# Patient Record
Sex: Female | Born: 1965 | Race: Black or African American | Hispanic: No | Smoking: Former smoker
Health system: Southern US, Community
[De-identification: ages and names within clinical notes are randomized; demographics above are authoritative.]

## PROBLEM LIST (undated history)

## (undated) DIAGNOSIS — F32A Depression, unspecified: Secondary | ICD-10-CM

## (undated) DIAGNOSIS — I10 Essential (primary) hypertension: Secondary | ICD-10-CM

## (undated) DIAGNOSIS — F419 Anxiety disorder, unspecified: Secondary | ICD-10-CM

## (undated) DIAGNOSIS — E119 Type 2 diabetes mellitus without complications: Secondary | ICD-10-CM

## (undated) DIAGNOSIS — F988 Other specified behavioral and emotional disorders with onset usually occurring in childhood and adolescence: Secondary | ICD-10-CM

## (undated) DIAGNOSIS — F329 Major depressive disorder, single episode, unspecified: Secondary | ICD-10-CM

## (undated) HISTORY — DX: Major depressive disorder, single episode, unspecified: F32.9

## (undated) HISTORY — DX: Essential (primary) hypertension: I10

## (undated) HISTORY — PX: LEG SURGERY: SHX1003

## (undated) HISTORY — DX: Depression, unspecified: F32.A

## (undated) HISTORY — DX: Anxiety disorder, unspecified: F41.9

## (undated) HISTORY — DX: Other specified behavioral and emotional disorders with onset usually occurring in childhood and adolescence: F98.8

## (undated) HISTORY — PX: ABDOMINAL HYSTERECTOMY: SHX81

---

## 2009-08-19 DIAGNOSIS — E119 Type 2 diabetes mellitus without complications: Secondary | ICD-10-CM

## 2009-08-19 HISTORY — DX: Type 2 diabetes mellitus without complications: E11.9

## 2014-05-18 ENCOUNTER — Encounter (HOSPITAL_COMMUNITY): Payer: Self-pay | Admitting: Emergency Medicine

## 2014-05-18 ENCOUNTER — Emergency Department (HOSPITAL_COMMUNITY): Payer: BC Managed Care – PPO

## 2014-05-18 ENCOUNTER — Emergency Department (HOSPITAL_COMMUNITY)
Admission: EM | Admit: 2014-05-18 | Discharge: 2014-05-19 | Disposition: A | Discharge status: Home / Self Care | Payer: BC Managed Care – PPO | Attending: Emergency Medicine | Admitting: Emergency Medicine

## 2014-05-18 DIAGNOSIS — Z79899 Other long term (current) drug therapy: Secondary | ICD-10-CM | POA: Insufficient documentation

## 2014-05-18 DIAGNOSIS — W458XXA Other foreign body or object entering through skin, initial encounter: Secondary | ICD-10-CM | POA: Insufficient documentation

## 2014-05-18 DIAGNOSIS — T148XXA Other injury of unspecified body region, initial encounter: Secondary | ICD-10-CM

## 2014-05-18 DIAGNOSIS — Z792 Long term (current) use of antibiotics: Secondary | ICD-10-CM | POA: Insufficient documentation

## 2014-05-18 DIAGNOSIS — Y93E5 Activity, floor mopping and cleaning: Secondary | ICD-10-CM | POA: Insufficient documentation

## 2014-05-18 DIAGNOSIS — S60451A Superficial foreign body of left index finger, initial encounter: Secondary | ICD-10-CM | POA: Insufficient documentation

## 2014-05-18 DIAGNOSIS — Y92009 Unspecified place in unspecified non-institutional (private) residence as the place of occurrence of the external cause: Secondary | ICD-10-CM | POA: Diagnosis not present

## 2014-05-18 DIAGNOSIS — S60459A Superficial foreign body of unspecified finger, initial encounter: Secondary | ICD-10-CM | POA: Diagnosis present

## 2014-05-18 DIAGNOSIS — E1140 Type 2 diabetes mellitus with diabetic neuropathy, unspecified: Secondary | ICD-10-CM | POA: Insufficient documentation

## 2014-05-18 DIAGNOSIS — E1149 Type 2 diabetes mellitus with other diabetic neurological complication: Secondary | ICD-10-CM | POA: Diagnosis not present

## 2014-05-18 DIAGNOSIS — W268XXA Contact with other sharp object(s), not elsewhere classified, initial encounter: Secondary | ICD-10-CM | POA: Diagnosis not present

## 2014-05-18 DIAGNOSIS — S61209A Unspecified open wound of unspecified finger without damage to nail, initial encounter: Secondary | ICD-10-CM | POA: Diagnosis not present

## 2014-05-18 DIAGNOSIS — Y92039 Unspecified place in apartment as the place of occurrence of the external cause: Secondary | ICD-10-CM | POA: Insufficient documentation

## 2014-05-18 DIAGNOSIS — S6992XA Unspecified injury of left wrist, hand and finger(s), initial encounter: Secondary | ICD-10-CM

## 2014-05-18 DIAGNOSIS — Z23 Encounter for immunization: Secondary | ICD-10-CM | POA: Diagnosis not present

## 2014-05-18 DIAGNOSIS — E1142 Type 2 diabetes mellitus with diabetic polyneuropathy: Secondary | ICD-10-CM | POA: Diagnosis not present

## 2014-05-18 DIAGNOSIS — S61201A Unspecified open wound of left index finger without damage to nail, initial encounter: Secondary | ICD-10-CM | POA: Insufficient documentation

## 2014-05-18 HISTORY — DX: Type 2 diabetes mellitus without complications: E11.9

## 2014-05-18 MED ORDER — LIDOCAINE HCL (PF) 1 % IJ SOLN
5.0000 mL | Freq: Once | INTRAMUSCULAR | Status: AC
  Administered 2014-05-18: 5 mL
  Filled 2014-05-18: qty 5

## 2014-05-18 NOTE — ED Notes (Signed)
Pt. presents with tip of  fish hook embedded at left distal index finger sustained this evening while cleaning her carpet at home , no bleeding .

## 2014-05-19 ENCOUNTER — Emergency Department (HOSPITAL_COMMUNITY): Payer: BC Managed Care – PPO

## 2014-05-19 DIAGNOSIS — Z23 Encounter for immunization: Secondary | ICD-10-CM | POA: Diagnosis not present

## 2014-05-19 DIAGNOSIS — E1140 Type 2 diabetes mellitus with diabetic neuropathy, unspecified: Secondary | ICD-10-CM | POA: Diagnosis not present

## 2014-05-19 DIAGNOSIS — W458XXA Other foreign body or object entering through skin, initial encounter: Secondary | ICD-10-CM | POA: Diagnosis not present

## 2014-05-19 DIAGNOSIS — S61201A Unspecified open wound of left index finger without damage to nail, initial encounter: Secondary | ICD-10-CM | POA: Diagnosis not present

## 2014-05-19 DIAGNOSIS — Y93E5 Activity, floor mopping and cleaning: Secondary | ICD-10-CM | POA: Diagnosis not present

## 2014-05-19 DIAGNOSIS — Z792 Long term (current) use of antibiotics: Secondary | ICD-10-CM | POA: Diagnosis not present

## 2014-05-19 DIAGNOSIS — S60451A Superficial foreign body of left index finger, initial encounter: Secondary | ICD-10-CM | POA: Diagnosis not present

## 2014-05-19 DIAGNOSIS — Y92039 Unspecified place in apartment as the place of occurrence of the external cause: Secondary | ICD-10-CM | POA: Diagnosis not present

## 2014-05-19 DIAGNOSIS — Z79899 Other long term (current) drug therapy: Secondary | ICD-10-CM | POA: Diagnosis not present

## 2014-05-19 MED ORDER — TETANUS-DIPHTH-ACELL PERTUSSIS 5-2.5-18.5 LF-MCG/0.5 IM SUSP
0.5000 mL | Freq: Once | INTRAMUSCULAR | Status: AC
  Administered 2014-05-19: 0.5 mL via INTRAMUSCULAR
  Filled 2014-05-19: qty 0.5

## 2014-05-19 MED ORDER — LIDOCAINE HCL (PF) 1 % IJ SOLN
5.0000 mL | Freq: Once | INTRAMUSCULAR | Status: AC
  Administered 2014-05-19: 5 mL
  Filled 2014-05-19: qty 5

## 2014-05-19 MED ORDER — CLINDAMYCIN HCL 150 MG PO CAPS: 300.0000 mg | ORAL_CAPSULE | Freq: Three times a day (TID) | ORAL | Status: DC

## 2014-05-19 NOTE — ED Provider Notes (Signed)
Medical screening examination/treatment/procedure(s) were performed by non-physician practitioner and as supervising physician I was immediately available for consultation/collaboration.   EKG Interpretation None        Tomasita CrumbleAdeleke Kayda Allers, MD 05/19/14 0600

## 2014-05-19 NOTE — ED Provider Notes (Signed)
CSN: [id number]     Arrival date & time 05/18/14  2230 History   First MD Initiated Contact with Frances Wilson 05/18/14 2353     Chief Complaint  Frances Wilson presents with  . Finger Injury     (Consider location/radiation/quality/duration/timing/severity/associated sxs/prior Treatment) The history is provided by the Frances Wilson. No language interpreter was used.  Frances Wilson is a 48 y/o F with PMHx of DM presenting to the ED with fish hook stuck in Frances Wilson left index finger. Frances Wilson reported that Frances Wilson has history of DM and neuropathy - stated that Frances Wilson was on the ground and felt something sharp on Frances Wilson hand and stated that the sharp object poked Frances Wilson. Stated that Frances Wilson noticed the fish hook when it was stuck in Frances Wilson finger. Frances Wilson reported that Frances Wilson recently moved into a new apartment and stated that Frances Wilson does not own a fish hook, nor does Frances Wilson know who it is from. Stated that Frances Wilson has been having pain in Frances Wilson left index finger. Stated that Frances Wilson unsure of when Frances Wilson last tetanus shot was. Denied numbness, tingling, loss of sensation. PCP none   Past Medical History  Diagnosis Date  . Diabetes mellitus without complication    Past Surgical History  Procedure Laterality Date  . Abdominal hysterectomy    . Leg surgery     No family history on file. History  Substance Use Topics  . Smoking status: Never Smoker   . Smokeless tobacco: Not on file  . Alcohol Use: Yes   OB History   Grav Para Term Preterm Abortions TAB SAB Ect Mult Living                 Review of Systems  Skin: Positive for wound.  Neurological: Negative for weakness and numbness.      Allergies  Maxipime  Home Medications   Prior to Admission medications   Medication Sig Start Date End Date Taking? Authorizing Provider  glipiZIDE (GLUCOTROL XL) 5 MG 24 hr tablet Take 5 mg by mouth daily with breakfast.   Yes Historical Provider, MD  metFORMIN (GLUCOPHAGE) 1000 MG tablet Take 1,000 mg by mouth 2 (two) times daily with a meal.   Yes  Historical Provider, MD  clindamycin (CLEOCIN) 150 MG capsule Take 2 capsules (300 mg total) by mouth 3 (three) times daily. 05/19/14   Dellie Piasecki, PA-C   BP 144/91  Pulse 78  Temp(Src) 97.5 F (36.4 C) (Oral)  Resp 16  Ht 5\' 6"  (1.676 m)  Wt 180 lb (81.647 kg)  BMI 29.07 kg/m2  SpO2 100% Physical Exam  Nursing note and vitals reviewed. Constitutional: Frances Wilson is oriented to person, place, and time. Frances Wilson appears well-developed and well-nourished. No distress.  HENT:  Head: Normocephalic and atraumatic.  Eyes: Conjunctivae and EOM are normal. Right eye exhibits no discharge. Left eye exhibits no discharge.  Neck: Normal range of motion. Neck supple. No tracheal deviation present.  Cardiovascular: Normal rate, regular rhythm and normal heart sounds.   Pulses:      Radial pulses are 2+ on the right side, and 2+ on the left side.  Pulmonary/Chest: Effort normal and breath sounds normal. No respiratory distress. Frances Wilson has no wheezes. Frances Wilson has no rales.  Musculoskeletal: Normal range of motion. Frances Wilson exhibits tenderness.  Tenderness upon palpation to the radial aspect of the left index finger where fish hook is placed. Full ROM to the left index finger without difficulty or ataxia.   Lymphadenopathy:    Frances Wilson has no cervical adenopathy.  Neurological: Frances Wilson is alert and oriented to person, place, and time. No cranial nerve deficit. Frances Wilson exhibits normal muscle tone. Coordination normal.  Cranial nerves III-XII grossly intact Strength 5+/5+ to upper extremities bilaterally with resistance applied, equal distribution noted Strength intact to MCP, PIP, DIP joints of left hand Sensation intact with differentiation to sharp and dull touch   Skin: Skin is warm and dry. No rash noted. Frances Wilson is not diaphoretic. No erythema.  Fish hook through tissue of the distal left phalanx, radial aspect. Negative bleeding noted.   Psychiatric: Frances Wilson has a normal mood and affect. Frances Wilson behavior is normal. Thought content  normal.    ED Course  Procedures (including critical care time) Labs Review Labs Reviewed - No data to display  Imaging Review Dg Finger Index Left  05/19/2014   CLINICAL DATA:  Left index finger laceration after fishhook stuck in the distal finger.  EXAM: LEFT INDEX FINGER 2+V  COMPARISON:  05/19/2015  FINDINGS: Interval removal of metallic foreign body from the distal aspect left second finger. Soft tissues not appear unremarkable. No evidence of acute fracture or dislocation. No residual foreign bodies demonstrated.  IMPRESSION: Negative.   Electronically Signed   By: Burman Nieves M.D.   On: 05/19/2014 01:22   Dg Finger Index Left  05/18/2014   CLINICAL DATA:  Finger injury with fishhook stuck in the distal phalanx  EXAM: LEFT INDEX FINGER 2+V  COMPARISON:  None.  FINDINGS: Foreign body in the soft tissues of the distal aspect left second finger consistent with fishhook. Associated soft tissue laceration. Underlying bones appear intact. There is no evidence of fracture or dislocation. There is no evidence of arthropathy or other focal bone abnormality.  IMPRESSION: Fish hook in the soft tissues of the distal left second finger. No acute bony abnormalities.   Electronically Signed   By: Burman Nieves M.D.   On: 05/18/2014 23:31     EKG Interpretation None      MDM   Final diagnoses:  Fish hook injury of finger, left, initial encounter  Puncture wound    Medications  lidocaine (PF) (XYLOCAINE) 1 % injection 5 mL (5 mLs Infiltration Given 05/18/14 2349)  lidocaine (PF) (XYLOCAINE) 1 % injection 5 mL (5 mLs Infiltration Given 05/19/14 0045)  Tdap (BOOSTRIX) injection 0.5 mL (0.5 mLs Intramuscular Given 05/19/14 0043)   Filed Vitals:   05/18/14 2238 05/19/14 0209  BP: 148/95 144/91  Pulse: 104 78  Temp: 97.7 F (36.5 C) 97.5 F (36.4 C)  TempSrc: Oral Oral  Resp: 18 16  Height: 5\' 6"  (1.676 m)   Weight: 180 lb (81.647 kg)   SpO2: 95% 100%    Frances Wilson presenting to the  ED with fish hook stuck in the distal phalanx of the left index finger, radial aspect. Full ROM noted with negative difficulty or ataxia. Pulses palpable and strong. Sensation intact with differentiation. 2 point discrimination intact. Removal of fish hook performed by Sarita Bottom, PA-C - Frances Wilson tolerated procedure well, please see Alfonzo Beers note for further information. Post-removal plain film performed with negative remainings. Site thoroughly cleaned and finger soaked. Frances Wilson tetanus updated. Frances Wilson stable, afebrile. Frances Wilson not septic appearing. Discharged Frances Wilson. Discharged with antibiotics since Frances Wilson has history of DM. [facility]erred [facility]. Discussed with Frances Wilson to keep site clean. Discussed with Frances Wilson to rest and stay hydrated. Discussed with Frances Wilson to closely monitor symptoms and if symptoms are to worsen or change to report back to the ED -  strict return instructions given.  Frances Wilson agreed to plan of care, understood, all questions answered.   Raymon MuttonMarissa Vaness Jelinski, PA-C 05/19/14 [phone]

## 2014-05-19 NOTE — Discharge Instructions (Signed)
Please call your doctor for a followup appointment within 24-48 hours. When you talk to your doctor please let them know that you were seen in the emergency department and have them acquire all of your records so that they can discuss the findings with you and formulate a treatment plan to fully care for your new and ongoing problems. Please call and set-up an appointment with hand specialist Please rest and stay hydrated Please keep site clean at all times - please wash with warm warm and soap Please keep dry with fresh clean gauze - if guaze get wet please change them Please take antibiotics as prescribed - please take on a full stomach  Please continue to monitor symptoms closely and if symptoms are to worsen or change (fever greater than 101, chills, sweating, nausea, vomiting, chest pain, shortness of breathe, difficulty breathing, weakness, numbness, tingling, worsening or changes to pain pattern, swelling to the finger, redness, red streaks, hot to the touch, drainage) please report back to the Emergency Department immediately.   Fish Hook Removal A fish hook can cause a small cut or lesion that extends through all layers of the skin and into the subcutaneous tissue. Because of this, bacteria may get injected beneath the surface of the skin. A simple bandage (dressing) may be applied. This should be changed daily. Follow your caregiver's instructions regarding use of any antibacterial ointments.  Only take over-the-counter or prescription medicines for pain, discomfort, or fever as directed by your caregiver. If you did not receive a tetanus shot from your caregiver because you did not recall when your last one was given, make sure to check with your physician's office and determine if one is needed. Generally for a "dirty" wound, you should receive a tetanus booster if you have not had one in the last five years. If you have a "clean" wound, you should receive a tetanus booster if you have not had  one within the last ten years. SEEK IMMEDIATE MEDICAL CARE IF:   You develop redness, swelling, or increasing pain in the wound.  You have a fever.  You notice a bad smell coming from the wound or dressing.  You notice pus or other unusual drainage coming from the wound. Document Released: 08/02/2000 Document Revised: 10/28/2011 Document Reviewed: 02/23/2009 [facility] Patient Information 2015 [place], [place]. This information is not intended to replace advice given to you by your health care provider. Make sure you discuss any questions you have with your health care provider.  Puncture Wound A puncture wound is an injury that extends through all layers of the skin and into the tissue beneath the skin (subcutaneous tissue). Puncture wounds become infected easily because germs often enter the body and go beneath the skin during the injury. Having a deep wound with a small entrance point makes it difficult for your caregiver to adequately clean the wound. This is especially true if you have stepped on a nail and it has passed through a dirty shoe or other situations where the wound is obviously contaminated. CAUSES  Many puncture wounds involve glass, nails, splinters, fish hooks, or other objects that enter the skin (foreign bodies). A puncture wound may also be caused by a human bite or animal bite. DIAGNOSIS  A puncture wound is usually diagnosed by your history and a physical exam. You may need to have an X-ray or an ultrasound to check for any foreign bodies still in the wound. TREATMENT   Your caregiver will clean the wound as thoroughly  as possible. Depending on the location of the wound, a bandage (dressing) may be applied.  Your caregiver might prescribe antibiotic medicines.  You may need a follow-up visit to check on your wound. Follow all instructions as directed by your caregiver. HOME CARE INSTRUCTIONS   Change your dressing once per day, or as directed by your caregiver. If  the dressing sticks, it may be removed by soaking the area in water.  If your caregiver has given you follow-up instructions, it is very important that you return for a follow-up appointment. Not following up as directed could result in a chronic or permanent injury, pain, and disability.  Only take over-the-counter or prescription medicines for pain, discomfort, or fever as directed by your caregiver.  If you are given antibiotics, take them as directed. Finish them even if you start to feel better. You may need a tetanus shot if:  You cannot remember when you had your last tetanus shot.  You have never had a tetanus shot. If you got a tetanus shot, your arm may swell, get red, and feel warm to the touch. This is common and not a problem. If you need a tetanus shot and you choose not to have one, there is a rare chance of getting tetanus. Sickness from tetanus can be serious. You may need a rabies shot if an animal bite caused your puncture wound. SEEK MEDICAL CARE IF:   You have redness, swelling, or increasing pain in the wound.  You have red streaks going away from the wound.  You notice a bad smell coming from the wound or dressing.  You have yellowish-white fluid (pus) coming from the wound.  You are treated with an antibiotic for infection, but the infection is not getting better.  You notice something in the wound, such as rubber from your shoe, cloth, or another object.  You have a fever.  You have severe pain.  You have difficulty breathing.  You feel dizzy or faint.  You cannot stop vomiting.  You lose feeling, develop numbness, or cannot move a limb below the wound.  Your symptoms worsen. MAKE SURE YOU:  Understand these instructions.  Will watch your condition.  Will get help right away if you are not doing well or get worse. Document Released: 05/15/2005 Document Revised: 10/28/2011 Document Reviewed: 01/22/2011 [facility] Patient Information 2015  [place], [place]. This information is not intended to replace advice given to you by your health care provider. Make sure you discuss any questions you have with your health care provider.   Emergency Department Resource Guide 1) Find a Doctor and Pay Out of Pocket Although you won't have to find out who is covered by your insurance plan, it is a good idea to ask around and get recommendations. You will then need to call the office and see if the doctor you have chosen will accept you as a new patient and what types of options they offer for patients who are self-pay. Some doctors offer discounts or will set up payment plans for their patients who do not have insurance, but you will need to ask so you aren't surprised when you get to your appointment.  2) Contact Your Local Health Department Not all health departments have doctors that can see patients for sick visits, but many do, so it is worth a call to see if yours does. If you don't know where your local health department is, you can check in your phone book. The CDC also has a  tool to help you locate your state's health department, and many state websites also have listings of all of their local health departments.  3) Find a Walk-in Clinic If your illness is not likely to be very severe or complicated, you may want to try a walk in clinic. These are popping up all over the country in pharmacies, drugstores, and shopping centers. They're usually staffed by nurse practitioners or physician assistants that have been trained to treat common illnesses and complaints. They're usually fairly quick and inexpensive. However, if you have serious medical issues or chronic medical problems, these are probably not your best option.  No Primary Care Doctor: - Call Health Connect at  [phone] - they can help you locate a primary care doctor that  accepts your insurance, provides certain services, etc. - Physician Referral Service- [phone]  Chronic Pain  Problems: Organization         Address  Phone   Notes  [facility]  [phone] Patients need to be referred by their primary care doctor.   Medication Assistance: Organization         Address  Phone   Notes  [facility] Medication [facility] [address] [place], [place] [address] [phone] --Must be a resident of [facility] -- Must have NO insurance coverage whatsoever (no Medicaid/ Medicare, etc.) -- The pt. MUST have a primary care doctor that directs their care regularly and follows them in the community   [facility]  [phone]   [employer]  [phone]    Agencies that provide inexpensive medical care: Organization         Address  Phone   Notes  [facility]  [phone]   [facility]    [phone]   [facility] [address] [place], [place] [address] [phone]   [facility] [address], [place] [phone]   Planned Parenthood    [phone]   [facility]    [phone]   [facility]  [address], [place] Phone:  [phone], Fax:  [phone] Hours of Operation:  9 am - 6 pm, M-F.  Also accepts Medicaid/Medicare and self-pay.  [facility]  [address], [place] Phone: [phone], Fax: [phone]. Hours of Operation:  8:30 am - 5:30 pm, M-F.  Also accepts Medicaid and self-pay.  [facility] [place] [address] [place] Phone: [phone]   [facility] [address] Natasha Bence [place], [place] [phone], Ext. [phone] Mondays & Thursdays: 7-9 AM.  First 15 patients are seen on a first come, first serve basis.    Medicaid-accepting [facility] Providers:  Organization         Address  Phone   Notes  [facility] [address], [place] [phone] Also  accepts self-pay patients.  [facility] [address] Laurell Josephs [place], [place]  [phone]   [facility] [address], [place] [phone]   [facility] [address] [place] [phone]   Renaye Rakers [address], [place]   [phone] Only accepts [place] Access [place] patients after they have their name applied to their card.   Self-Pay (no insurance) in [place]:  Organization         Address  Phone   Notes  Sickle Cell Patients, [facility] [address] [place], [place] [phone]   [facility] [address] [place], [place] [phone]   [facility] [place]  [address],  [phone]   [facility]/Dr. Osei-Bonsu  [address] [place] or [address], [place] [phone] Phone number for both [place] and [place] locations is the same.  [facility]gent [facility] and [facility] [address], [place] [phone]   [facility] [address] [place] or [address] [phone]   [facility] [address], [place] [phone], phone; [phone], fax Sees patients 1st and 3rd Saturday of every month.  Must not qualify for public or private insurance (i.e. Medicaid, Medicare, [place] Health Choice, Veterans' Benefits)  Household income should be no more than 200% of the poverty level The clinic cannot treat you if you are pregnant or think you are pregnant  Sexually transmitted diseases are not treated at the clinic.    Dental Care: Organization         Address  Phone  Notes  [facility] Department of [facility] [address] [place], [place] [phone] Accepts children up to age 70 who are enrolled in [place] or [place] Health Choice; pregnant  women with a Medicaid card; and children who have applied for Medicaid or [place] Health Choice, but were declined, whose parents can pay a reduced fee at time of service.  [facility] Department of [facility]  [address], [place] [phone] Accepts children up to age 60 who are enrolled in [place] or [place] Health Choice; pregnant women with a Medicaid card; and children who have applied for Medicaid or [place] Health Choice, but were declined, whose parents can pay a reduced fee at time of service.  [facility]  [address] [place], [place] [phone] Patients are seen by appointment only. Walk-ins are not accepted. [facility] will see patients 62 years of age and older. Monday - Tuesday (8am-5pm) Most Wednesdays (8:30-5pm) $30 per visit, cash only  [facility] Adult Dental Access [facility]  [address], [facility] [phone] Patients are seen by appointment only. Walk-ins are not accepted. [facility] will see patients 43 years of age and older. One Wednesday Evening (Monthly: Volunteer Based).  $30 per visit, cash only  [employer]  [phone] for adults; Children under age 68, call Graduate Pediatric Dentistry at [phone]. Children aged 65-14, please call [phone] to request a pediatric application.  Dental services are provided in all areas of dental care including fillings, crowns and bridges, complete and partial dentures, implants, gum treatment, root canals, and extractions. Preventive care is also provided. Treatment is provided to both adults and children. Patients are selected via a lottery and there is often a waiting list.   [facility] [address] [place]  [phone] [email or web address]   [facility] [address] [place], [place] [phone], Ext. [phone] Second and Fourth Thursday of each month, opens at 6:30 AM; Clinic ends at 9 AM.  Patients are  seen on a first-come first-served basis, and a limited number are seen during each clinic.  [facility]  [address] Ether Griffins [place], [place] [phone]   Eligibility Requirements You must have lived in [place], [place], or [place] counties for at least the last three months.   You cannot be eligible for state or federal sponsored [place], including [employer], [place], or [employer].   You generally cannot be eligible for healthcare insurance through your employer.    How to apply: Eligibility screenings are held every Tuesday and Wednesday afternoon from 1:00 pm until 4:00 pm. You do not need an appointment for the interview!  [facility] [address], [place], [place] [phone]   [facility] Health Department  [phone]   [facility] Health Department  [phone]   [facility]  [phone]    Behavioral Health Resources in the Community: Intensive Outpatient Programs Organization         Address  Phone  Notes  [facility] Services [address], [place], [place] [phone]   [facility] Outpatient [address], [place], [place] [phone]   [facility] [address] [place], [place]  [phone]   [facility] Mental Health [address],  [place], [place] [phone] or [phone]   Substance Abuse Resources Organization         Address  Phone  Notes  [facility]  [phone]   [facility]  [phone]   [facility] [place]  [phone]   Floydene Flock  [phone]   Residential & Outpatient Substance Abuse Program  [phone]   Psychological Services Organization         Address  Phone  Notes  [facility]  [phone]   [facility] Services  [phone]   [facility] Mental Health [address] [place] [phone] or [phone]    Mobile Crisis  Teams Organization         Address  Phone  Notes  [facility], Mobile Crisis Care Unit  [phone]   [facility]  [address] [place], [place] [phone]   Doristine Locks [address] [place] [phone]    Self-Help/Support Groups Organization         Address  Phone             Notes  [facility] [place] - variety of support groups  336- I7437963 Call for more information  Narcotics Anonymous (NA), [facility] [address], [employer] [place]  2 meetings at this location   Statistician         Address  Phone  Notes  [facility] [address],    [place]  [phone]   [facility]  [address], [place] [address], [place], [place] [phone]   [facility] [address] [place], [place] [phone] Admissions: 8am-3pm M-F  [facility] [address]    [place], [place] [phone]   [facility] [address] Starling Manns [place], [place] [phone]   [facility] [address],  [place], [place] [phone]   [facility] - Intensive Outpatient [address] Laurell Josephs [address], [place], [place] [phone]   [facility] ([facility] [address] [place]  [place], [place] [phone] or [phone]   [facility] ([facility]) [address] [place], [place] [phone] Accepts Medicaid  [facility] [address],  [place] [phone] Substance Abuse/Addiction Treatment  [facility] Behavioral Health Resources Organization         Address  Phone  Notes  [facility]  [phone]) [phone]   Angie FavaJulie Brannon, PhD [address] [place], [place]   [phone]) [phone] or [phone]) [phone]   [facility]   [address] [place], [place] [phone]) [phone]   [facility] [address], [place], [place] [phone](336) [phone]  Insurance/Medicaid/sponsorship through [facility]  [facility] [address]                                    [place], [place] [phone](336) [phone] Therapy/tele-psych/case  [facility] [address].   [place], [place] [phone]) [phone]    Dr. Lolly MustacheArfeen  [phone]) [phone]   [facility]  [employer] [facility] 1) [address], [place] 2) [address], [place] 3)  [address], [place] [phone](336) [phone](336) [phone]  [phone](336) [phone]   [facility] Child Abuse Hotline [phone](336) [phone] or [phone](336) [phone] (After Hours)

## 2014-05-19 NOTE — ED Provider Notes (Signed)
Frances Wilson is a 48 y.o. female diabetic with fishhook into the left second digit recurrent just prior to arrival.   FOREIGN BODY REMOVAL Date/Time: 05/19/2014 12:47 AM Performed by: Wynetta EmeryPISCIOTTA, Nixie Laube Authorized by: Wynetta EmeryPISCIOTTA, Tafari Humiston Consent: Verbal consent obtained. Risks and benefits: risks, benefits and alternatives were discussed Consent given by: patient Patient understanding: patient states understanding of the procedure being performed Patient identity confirmed: verbally with patient Body area: skin Anesthesia: nerve block Local anesthetic: lidocaine 1% without epinephrine Anesthetic total: 9 ml Patient sedated: no Patient restrained: no Localization method: serial x-rays and visualized Removal mechanism: forceps and hemostat Dressing: antibiotic ointment Tendon involvement: none Depth: deep Complexity: simple 1 objects recovered. Objects recovered: Metal barbed fishhook Post-procedure assessment: foreign body removed Patient tolerance: Patient tolerated the procedure well with no immediate complications.  Dg Finger Index Left  05/19/2014   CLINICAL DATA:  Left index finger laceration after fishhook stuck in the distal finger.  EXAM: LEFT INDEX FINGER 2+V  COMPARISON:  05/19/2015  FINDINGS: Interval removal of metallic foreign body from the distal aspect left second finger. Soft tissues not appear unremarkable. No evidence of acute fracture or dislocation. No residual foreign bodies demonstrated.  IMPRESSION: Negative.   Electronically Signed   By: Burman NievesWilliam  Stevens M.D.   On: 05/19/2014 01:22   Dg Finger Index Left  05/18/2014   CLINICAL DATA:  Finger injury with fishhook stuck in the distal phalanx  EXAM: LEFT INDEX FINGER 2+V  COMPARISON:  None.  FINDINGS: Foreign body in the soft tissues of the distal aspect left second finger consistent with fishhook. Associated soft tissue laceration. Underlying bones appear intact. There is no evidence of fracture or dislocation.  There is no evidence of arthropathy or other focal bone abnormality.  IMPRESSION: Fish hook in the soft tissues of the distal left second finger. No acute bony abnormalities.   Electronically Signed   By: Burman NievesWilliam  Stevens M.D.   On: 05/18/2014 23:31      Wynetta Emeryicole Antoinne Spadaccini, PA-C 05/19/14 0215

## 2014-05-19 NOTE — ED Provider Notes (Signed)
Medical screening examination/treatment/procedure(s) were performed by non-physician practitioner and as supervising physician I was immediately available for consultation/collaboration.   EKG Interpretation None        Tomasita CrumbleAdeleke Brighton Delio, MD 05/19/14 [phone]

## 2014-09-23 ENCOUNTER — Ambulatory Visit (INDEPENDENT_AMBULATORY_CARE_PROVIDER_SITE_OTHER): Payer: BLUE CROSS/BLUE SHIELD | Admitting: Family Medicine

## 2014-09-23 VITALS — BP 168/98 | HR 83 | Temp 98.2°F | Resp 16 | Ht 66.0 in | Wt 180.0 lb

## 2014-09-23 DIAGNOSIS — F900 Attention-deficit hyperactivity disorder, predominantly inattentive type: Secondary | ICD-10-CM | POA: Insufficient documentation

## 2014-09-23 DIAGNOSIS — I10 Essential (primary) hypertension: Secondary | ICD-10-CM

## 2014-09-23 DIAGNOSIS — E119 Type 2 diabetes mellitus without complications: Secondary | ICD-10-CM

## 2014-09-23 DIAGNOSIS — F909 Attention-deficit hyperactivity disorder, unspecified type: Secondary | ICD-10-CM | POA: Insufficient documentation

## 2014-09-23 MED ORDER — LISINOPRIL-HYDROCHLOROTHIAZIDE 20-12.5 MG PO TABS: 1.0000 | ORAL_TABLET | Freq: Every day | ORAL | Status: DC

## 2014-09-23 NOTE — Progress Notes (Signed)
° °  Subjective:    Patient ID: Frances Wilson, female    DOB: Aug 28, 1965, 49 y.o.   MRN: [id number] This chart was scribed for Elvina SidleKurt Lauenstein, MD by Littie Deedsichard Sun, Medical Scribe. This patient was seen in Room 9 and the patient's care was started at 3:54 PM.   HPI HPI Comments: Frances Wilson is a 49 y.o. female with a hx of DM who presents to the [facility] complaining of elevated blood pressure. She reports having associated gradual onset headache that started a few weeks ago but worsened today. She has taken ibuprofen but without relief. She notes that she has been waking up with a HA for the past few weeks, but today her HA was constant. Patient states that this is the first time that she has had high blood pressure.  Patient recently started taking metformin again last month after a long period of not taking it. She is supposed to take her blood sugar at home, but she has not been doing so. She has been doing well on the medication. Her [facility].  Her Adderall is filled by the [facility].  Patient states that she has had enlarged thyroid since her 3220s.  Patient works at [employer]. [place].  Review of Systems  Neurological: Positive for headaches.       Objective:   Physical Exam CONSTITUTIONAL: Well developed/well nourished HEAD: Normocephalic/atraumatic EYES: EOM/PERRL ENMT: Mucous membranes moist NECK: supple with diffusely enlarged goiter SPINE: entire spine nontender CV: S1/S2 noted, no murmurs/rubs/gallops noted LUNGS: Lungs are clear to auscultation bilaterally, no apparent distress ABDOMEN: soft, nontender, no rebound or guarding GU: no cva tenderness NEURO: Pt is awake/alert, moves all extremitiesx4 EXTREMITIES: pulses normal, full ROM SKIN: warm, color normal PSYCH: no abnormalities of mood noted     Assessment & Plan:   This chart was scribed in my  presence and reviewed by me personally.    ICD-9-CM ICD-10-CM   1. Essential hypertension 401.9 I10 lisinopril-hydrochlorothiazide (ZESTORETIC) 20-12.5 MG per tablet   Encouraged to make appt for CPE  Signed, Elvina SidleKurt Lauenstein, MD

## 2014-09-23 NOTE — Patient Instructions (Signed)

## 2014-11-28 ENCOUNTER — Encounter: Payer: Self-pay | Admitting: Family Medicine

## 2014-11-28 ENCOUNTER — Ambulatory Visit (INDEPENDENT_AMBULATORY_CARE_PROVIDER_SITE_OTHER): Payer: BLUE CROSS/BLUE SHIELD | Admitting: Family Medicine

## 2014-11-28 VITALS — BP 110/70 | HR 73 | Temp 98.3°F | Resp 16 | Ht 66.0 in | Wt 175.4 lb

## 2014-11-28 DIAGNOSIS — I10 Essential (primary) hypertension: Secondary | ICD-10-CM

## 2014-11-28 DIAGNOSIS — Z13220 Encounter for screening for lipoid disorders: Secondary | ICD-10-CM

## 2014-11-28 DIAGNOSIS — F902 Attention-deficit hyperactivity disorder, combined type: Secondary | ICD-10-CM | POA: Diagnosis not present

## 2014-11-28 DIAGNOSIS — E049 Nontoxic goiter, unspecified: Secondary | ICD-10-CM | POA: Diagnosis not present

## 2014-11-28 DIAGNOSIS — F418 Other specified anxiety disorders: Secondary | ICD-10-CM | POA: Diagnosis not present

## 2014-11-28 DIAGNOSIS — F32A Depression, unspecified: Secondary | ICD-10-CM

## 2014-11-28 DIAGNOSIS — Z23 Encounter for immunization: Secondary | ICD-10-CM | POA: Diagnosis not present

## 2014-11-28 DIAGNOSIS — E119 Type 2 diabetes mellitus without complications: Secondary | ICD-10-CM | POA: Diagnosis not present

## 2014-11-28 DIAGNOSIS — F329 Major depressive disorder, single episode, unspecified: Secondary | ICD-10-CM

## 2014-11-28 DIAGNOSIS — M25641 Stiffness of right hand, not elsewhere classified: Secondary | ICD-10-CM

## 2014-11-28 DIAGNOSIS — F419 Anxiety disorder, unspecified: Secondary | ICD-10-CM

## 2014-11-28 LAB — COMPREHENSIVE METABOLIC PANEL
ALT: 13 U/L (ref 0–35)
AST: 12 U/L (ref 0–37)
Albumin: 4.2 g/dL (ref 3.5–5.2)
Alkaline Phosphatase: 72 U/L (ref 39–117)
BILIRUBIN TOTAL: 0.3 mg/dL (ref 0.2–1.2)
BUN: 12 mg/dL (ref 6–23)
CO2: 27 meq/L (ref 19–32)
CREATININE: 0.68 mg/dL (ref 0.50–1.10)
Calcium: 9.7 mg/dL (ref 8.4–10.5)
Chloride: 99 mEq/L (ref 96–112)
Glucose, Bld: 210 mg/dL — ABNORMAL HIGH (ref 70–99)
Potassium: 3.9 mEq/L (ref 3.5–5.3)
Sodium: 137 mEq/L (ref 135–145)
Total Protein: 7.3 g/dL (ref 6.0–8.3)

## 2014-11-28 LAB — LIPID PANEL
CHOL/HDL RATIO: 7.9 ratio
Cholesterol: 261 mg/dL — ABNORMAL HIGH (ref 0–200)
HDL: 33 mg/dL — ABNORMAL LOW (ref >=46)
LDL Cholesterol: 174 mg/dL — ABNORMAL HIGH (ref 0–99)
Triglycerides: 271 mg/dL — ABNORMAL HIGH (ref <150)
VLDL: 54 mg/dL — ABNORMAL HIGH (ref 0–40)

## 2014-11-28 LAB — POCT GLYCOSYLATED HEMOGLOBIN (HGB A1C): Hemoglobin A1C: 11.6

## 2014-11-28 LAB — POCT URINALYSIS DIPSTICK
Bilirubin, UA: NEGATIVE
Glucose, UA: 500
Leukocytes, UA: NEGATIVE
Nitrite, UA: NEGATIVE
PROTEIN UA: NEGATIVE
SPEC GRAV UA: 1.02
UROBILINOGEN UA: 0.2
pH, UA: 5

## 2014-11-28 LAB — TSH: TSH: 0.405 u[IU]/mL (ref 0.350–4.500)

## 2014-11-28 LAB — T4, FREE: Free T4: 1.12 ng/dL (ref 0.80–1.80)

## 2014-11-28 LAB — GLUCOSE, POCT (MANUAL RESULT ENTRY): POC GLUCOSE: 212 mg/dL — AB (ref 70–99)

## 2014-11-28 MED ORDER — LISINOPRIL-HYDROCHLOROTHIAZIDE 20-12.5 MG PO TABS: 1.0000 | ORAL_TABLET | Freq: Every day | ORAL | Status: DC

## 2014-11-28 MED ORDER — GLIPIZIDE ER 10 MG PO TB24: 10.0000 mg | ORAL_TABLET | Freq: Every day | ORAL | Status: DC

## 2014-11-28 NOTE — Patient Instructions (Signed)
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
## 2014-11-29 DIAGNOSIS — F329 Major depressive disorder, single episode, unspecified: Secondary | ICD-10-CM | POA: Insufficient documentation

## 2014-11-29 DIAGNOSIS — E049 Nontoxic goiter, unspecified: Secondary | ICD-10-CM | POA: Insufficient documentation

## 2014-11-29 DIAGNOSIS — F419 Anxiety disorder, unspecified: Secondary | ICD-10-CM

## 2014-11-29 DIAGNOSIS — F32A Depression, unspecified: Secondary | ICD-10-CM | POA: Insufficient documentation

## 2014-11-29 DIAGNOSIS — I10 Essential (primary) hypertension: Secondary | ICD-10-CM | POA: Insufficient documentation

## 2014-11-29 LAB — MICROALBUMIN, URINE: Microalb, Ur: 1.7 mg/dL (ref <2.0)

## 2015-03-06 ENCOUNTER — Ambulatory Visit: Payer: BLUE CROSS/BLUE SHIELD | Admitting: Family Medicine

## 2015-03-27 ENCOUNTER — Encounter: Payer: Self-pay | Admitting: *Deleted

## 2015-08-17 ENCOUNTER — Telehealth: Payer: Self-pay | Admitting: Family Medicine

## 2015-08-17 NOTE — Telephone Encounter (Signed)
SPOKE WITH PATIENT AND SHE WILL BE COMING IN ON 08/22/15 TO SEE DEBBIE GESSNER FOR HER FOLLOW CARE ON HER DIABETES

## 2015-08-22 ENCOUNTER — Ambulatory Visit: Payer: Self-pay | Admitting: Family Medicine

## 2015-10-09 ENCOUNTER — Ambulatory Visit (INDEPENDENT_AMBULATORY_CARE_PROVIDER_SITE_OTHER): Payer: BLUE CROSS/BLUE SHIELD | Admitting: Physician Assistant

## 2015-10-09 VITALS — BP 138/84 | HR 93 | Temp 98.8°F | Resp 16 | Ht 66.0 in | Wt 170.0 lb

## 2015-10-09 DIAGNOSIS — R059 Cough, unspecified: Secondary | ICD-10-CM

## 2015-10-09 DIAGNOSIS — M791 Myalgia, unspecified site: Secondary | ICD-10-CM

## 2015-10-09 DIAGNOSIS — R05 Cough: Secondary | ICD-10-CM

## 2015-10-09 MED ORDER — HYDROCOD POLST-CPM POLST ER 10-8 MG/5ML PO SUER: 5.0000 mL | Freq: Two times a day (BID) | ORAL | Status: DC | PRN

## 2015-10-09 NOTE — Patient Instructions (Signed)
I suspect that you have The Flu, though I cannot be certain.   Influenza, Adult Influenza ("the flu") is a viral infection of the respiratory tract. It occurs more often in winter months because people spend more time in close contact with one another. Influenza can make you feel very sick. Influenza easily spreads from person to person (contagious). CAUSES  Influenza is caused by a virus that infects the respiratory tract. You can catch the virus by breathing in droplets from an infected person's cough or sneeze. You can also catch the virus by touching something that was recently contaminated with the virus and then touching your mouth, nose, or eyes. RISKS AND COMPLICATIONS You may be at risk for a more severe case of influenza if you smoke cigarettes, have diabetes, have chronic heart disease (such as heart failure) or lung disease (such as asthma), or if you have a weakened immune system. Elderly people and pregnant women are also at risk for more serious infections. The most common problem of influenza is a lung infection (pneumonia). Sometimes, this problem can require emergency medical care and may be life threatening. SIGNS AND SYMPTOMS  Symptoms typically last 4 to 10 days and may include:  Fever.  Chills.  Headache, body aches, and muscle aches.  Sore throat.  Chest discomfort and cough.  Poor appetite.  Weakness or feeling tired.  Dizziness.  Nausea or vomiting. DIAGNOSIS  Diagnosis of influenza is often made based on your history and a physical exam. A nose or throat swab test can be done to confirm the diagnosis. TREATMENT  In mild cases, influenza goes away on its own. Treatment is directed at relieving symptoms. For more severe cases, your health care provider may prescribe antiviral medicines to shorten the sickness. Antibiotic medicines are not effective because the infection is caused by a virus, not by bacteria. HOME CARE INSTRUCTIONS  Take medicines only as  directed by your health care provider.  Use a cool mist humidifier to make breathing easier.  Get plenty of rest until your temperature returns to normal. This usually takes 3 to 4 days.  Drink enough fluid to keep your urine clear or pale yellow.  Cover yourmouth and nosewhen coughing or sneezing,and wash your handswellto prevent thevirusfrom spreading.  Stay homefromwork orschool untilthe fever is gonefor at least 70full day. PREVENTION  An annual influenza vaccination (flu shot) is the best way to avoid getting influenza. An annual flu shot is now routinely recommended for all adults in the [place] SEEK MEDICAL CARE IF:  You experiencechest pain, yourcough worsens,or you producemore mucus.  Youhave nausea,vomiting, ordiarrhea.  Your fever returns or gets worse. SEEK IMMEDIATE MEDICAL CARE IF:  You havetrouble breathing, you become short of breath,or your skin ornails becomebluish.  You have severe painor stiffnessin the neck.  You develop a sudden headache, or pain in the face or ear.  You have nausea or vomiting that you cannot control. MAKE SURE YOU:   Understand these instructions.  Will watch your condition.  Will get help right away if you are not doing well or get worse.   This information is not intended to replace advice given to you by your health care provider. Make sure you discuss any questions you have with your health care provider.   Document Released: 08/02/2000 Document Revised: 08/26/2014 Document Reviewed: 11/04/2011 Elsevier Interactive Patient Education [employer]
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
## 2015-11-27 ENCOUNTER — Ambulatory Visit (INDEPENDENT_AMBULATORY_CARE_PROVIDER_SITE_OTHER): Payer: BLUE CROSS/BLUE SHIELD | Admitting: Family Medicine

## 2015-11-27 VITALS — BP 122/76 | HR 108 | Temp 98.3°F | Resp 17 | Ht 65.5 in | Wt 170.0 lb

## 2015-11-27 DIAGNOSIS — I10 Essential (primary) hypertension: Secondary | ICD-10-CM

## 2015-11-27 DIAGNOSIS — E049 Nontoxic goiter, unspecified: Secondary | ICD-10-CM

## 2015-11-27 DIAGNOSIS — E119 Type 2 diabetes mellitus without complications: Secondary | ICD-10-CM

## 2015-11-27 LAB — GLUCOSE, POCT (MANUAL RESULT ENTRY): POC Glucose: 239 mg/dl — AB (ref 70–99)

## 2015-11-27 LAB — TSH: TSH: 0.3 m[IU]/L — AB

## 2015-11-27 LAB — POCT GLYCOSYLATED HEMOGLOBIN (HGB A1C): Hemoglobin A1C: 11.7

## 2015-11-27 MED ORDER — GLIPIZIDE ER 10 MG PO TB24: 10.0000 mg | ORAL_TABLET | Freq: Every day | ORAL | Status: DC

## 2015-11-27 MED ORDER — CANAGLIFLOZIN 300 MG PO TABS: 300.0000 mg | ORAL_TABLET | Freq: Every day | ORAL | Status: DC

## 2015-11-27 MED ORDER — LISINOPRIL-HYDROCHLOROTHIAZIDE 20-12.5 MG PO TABS: 1.0000 | ORAL_TABLET | Freq: Every day | ORAL | Status: DC

## 2015-11-27 MED ORDER — METFORMIN HCL ER (OSM) 1000 MG PO TB24: 1000.0000 mg | ORAL_TABLET | Freq: Every day | ORAL | Status: DC

## 2015-11-27 NOTE — Progress Notes (Addendum)
Patient ID: Frances Wilson, female    DOB: 12-01-1965  Age: 50 y.o. MRN: [id number]  Chief Complaint  Patient presents with  . Medication Refill    Metformin and Glipizide   . Diabetes    follow up     Subjective:   Patient has been out of her glipizide for about a month. She realizes she needs to get serious about faithful treatment for her diabetes. She has read some on the ADA website. She does not get a lot of regular exercise. She has lost some weight. Review of systems is really unremarkable. Constitutional: Unremarkable. HEENT: Patient is been a little bit blurry. She plans to see an eye doctor soon. Respiratory: Unremarkable. Cardiovascular: Unremarkable GI: Unremarkable GU: Unremarkable Muscular skeletal: Unremarkable   Current allergies, medications, problem list, past/family and social histories reviewed.  Objective:  BP 122/76 mmHg  Pulse 108  Temp(Src) 98.3 F (36.8 C) (Oral)  Resp 17  Ht 5' 5.5" (1.664 m)  Wt 170 lb (77.111 kg)  BMI 27.85 kg/m2  SpO2 98%  Pleasant lady alert and oriented no acute distress. TMs normal. Throat clear. Neck supple without nodes. She does have a moderate goiter which she's had for a long time and is been worked up apparently a number of times. Chest clear. Heart regular without murmur.  Assessment & Plan:   Assessment: 1. Controlled type 2 diabetes mellitus without complication, without long-term current use of insulin (HCC)   2. Essential hypertension, benign   3. Essential hypertension   4. Diabetes mellitus without complication (HCC)   5. Goiter       Plan: Labs were ordered. Long discussion regarding taking her medication regularly. See instructions.  Orders Placed This Encounter  Procedures  . Comprehensive metabolic panel  . Lipid panel  . Microalbumin, urine  . TSH  . POCT glucose (manual entry)  . POCT glycosylated hemoglobin (Hb A1C)    Meds ordered this encounter  Medications  .  lisinopril-hydrochlorothiazide (ZESTORETIC) 20-12.5 MG tablet    Sig: Take 1 tablet by mouth daily.    Dispense:  90 tablet    Refill:  3  . glipiZIDE (GLUCOTROL XL) 10 MG 24 hr tablet    Sig: Take 1 tablet (10 mg total) by mouth daily with breakfast.    Dispense:  90 tablet    Refill:  3  . canagliflozin (INVOKANA) 300 MG TABS tablet    Sig: Take 1 tablet (300 mg total) by mouth daily before breakfast.    Dispense:  30 tablet    Refill:  5  . metformin (FORTAMET) 1000 MG (OSM) 24 hr tablet    Sig: Take 1 tablet (1,000 mg total) by mouth daily with breakfast.    Dispense:  180 tablet    Refill:  3   Results for orders placed or performed in visit on 11/27/15  Comprehensive metabolic panel  Result Value Ref Range   Sodium 136 135 - 146 mmol/L   Potassium 4.1 3.5 - 5.3 mmol/L   Chloride 98 98 - 110 mmol/L   CO2 27 20 - 31 mmol/L   Glucose, Bld 257 (H) 65 - 99 mg/dL   BUN 15 7 - 25 mg/dL   Creat 4.09 8.11 - 9.14 mg/dL   Total Bilirubin 0.3 0.2 - 1.2 mg/dL   Alkaline Phosphatase 63 33 - 130 U/L   AST 18 10 - 35 U/L   ALT 17 6 - 29 U/L   Total Protein 7.6 6.1 - 8.1 g/dL  Albumin 4.2 3.6 - 5.1 g/dL   Calcium 29.410.5 (H) 8.6 - 10.4 mg/dL  Lipid panel  Result Value Ref Range   Cholesterol 257 (H) 125 - 200 mg/dL   Triglycerides 765248 (H) <150 mg/dL   HDL 36 (L) >=46>=46 mg/dL   Total CHOL/HDL Ratio 7.1 (H) <=5.0 Ratio   VLDL 50 (H) <30 mg/dL   LDL Cholesterol 503171 (H) <130 mg/dL  Microalbumin, urine  Result Value Ref Range   Microalb, Ur 0.3 Not estab mg/dL  TSH  Result Value Ref Range   TSH 0.30 (L) mIU/L  POCT glucose (manual entry)  Result Value Ref Range   POC Glucose 239 (A) 70 - 99 mg/dl  POCT glycosylated hemoglobin (Hb A1C)  Result Value Ref Range   Hemoglobin A1C 11.7          Patient Instructions   Metformin 1000 mg twice daily  Invokana 300 mg daily  Glipizide 10 mg daily  Lisinopril hydrochlorothiazide 20/12.5 mg daily  Take her medicines  faithfully  Get daily exercise  Read the American diabetic Association website  Return in about 3 months for a recheck. It is time that you get serious with making sure the diabetes gets under good control.      IF you received an x-ray today, you will receive an invoice from [facility]. Please contact [facility] at [phone] with questions or concerns regarding your invoice.   IF you received labwork today, you will receive an invoice from [facility]. Please contact [facility] at [phone] with questions or concerns regarding your invoice.   Our billing staff will not be able to assist you with questions regarding bills from these companies.  You will be contacted with the lab results as soon as they are available. The fastest way to get your results is to activate your My Chart account. Instructions are located on the last page of this paperwork. If you have not heard from us regarding the results in 2 weeks, please contact this office.          Return in about 3 months (around 02/26/2016).   Shanita Kanan, MD 12/01/2015

## 2015-11-27 NOTE — Patient Instructions (Addendum)
Metformin 1000 mg twice daily  Invokana 300 mg daily  Glipizide 10 mg daily  Lisinopril hydrochlorothiazide 20/12.5 mg daily  Take her medicines faithfully  Get daily exercise  Read the American diabetic Association website  Return in about 3 months for a recheck. It is time that you get serious with making sure the diabetes gets under good control.      IF you received an x-ray today, you will receive an invoice from [facility]. Please contact [facility] at [phone] with questions or concerns regarding your invoice.   IF you received labwork today, you will receive an invoice from [facility]. Please contact [facility] at [phone] with questions or concerns regarding your invoice.   Our billing staff will not be able to assist you with questions regarding bills from these companies.  You will be contacted with the lab results as soon as they are available. The fastest way to get your results is to activate your My Chart account. Instructions are located on the last page of this paperwork. If you have not heard from us regarding the results in 2 weeks, please contact this office.

## 2015-11-28 LAB — COMPREHENSIVE METABOLIC PANEL
ALBUMIN: 4.2 g/dL (ref 3.6–5.1)
ALT: 17 U/L (ref 6–29)
AST: 18 U/L (ref 10–35)
Alkaline Phosphatase: 63 U/L (ref 33–130)
BUN: 15 mg/dL (ref 7–25)
CO2: 27 mmol/L (ref 20–31)
CREATININE: 0.7 mg/dL (ref 0.50–1.05)
Calcium: 10.5 mg/dL — ABNORMAL HIGH (ref 8.6–10.4)
Chloride: 98 mmol/L (ref 98–110)
GLUCOSE: 257 mg/dL — AB (ref 65–99)
Potassium: 4.1 mmol/L (ref 3.5–5.3)
Sodium: 136 mmol/L (ref 135–146)
Total Bilirubin: 0.3 mg/dL (ref 0.2–1.2)
Total Protein: 7.6 g/dL (ref 6.1–8.1)

## 2015-11-28 LAB — LIPID PANEL
Cholesterol: 257 mg/dL — ABNORMAL HIGH (ref 125–200)
HDL: 36 mg/dL — ABNORMAL LOW (ref >=46)
LDL CALC: 171 mg/dL — AB (ref <130)
TRIGLYCERIDES: 248 mg/dL — AB (ref <150)
Total CHOL/HDL Ratio: 7.1 Ratio — ABNORMAL HIGH (ref <=5.0)
VLDL: 50 mg/dL — ABNORMAL HIGH (ref <30)

## 2015-11-28 LAB — MICROALBUMIN, URINE: MICROALB UR: 0.3 mg/dL

## 2015-11-30 ENCOUNTER — Telehealth: Payer: Self-pay

## 2015-11-30 DIAGNOSIS — E119 Type 2 diabetes mellitus without complications: Secondary | ICD-10-CM

## 2015-11-30 NOTE — Telephone Encounter (Signed)
Barbara, pt. Called back and I read the message back to her and she was still confused. She wants to speak directly to you. Best callback number is [phone].

## 2015-11-30 NOTE — Telephone Encounter (Signed)
Pharm sent notice that pt would rather have the metformin 1000 BID than the metformin ER 1000 QD which is very expensive. I called pt and LM for her to call with preference of IR 1000 BID vs metformin ER 500, 2 tabs QD which is generic and should be inexpensive. I called pharm and they tried running both alternatives through and neither were covered. They suggested pt call [employer] and find out what metformin they will cover. I checked the [place] form online and it looks like they would cover the metformin ER 500, 2 tabs QD.

## 2015-12-01 MED ORDER — METFORMIN HCL ER 500 MG PO TB24: 1000.0000 mg | ORAL_TABLET | Freq: Every day | ORAL | Status: DC

## 2015-12-01 NOTE — Telephone Encounter (Signed)
Called pt back and got her VM. I advised her that since Dr Alwyn RenHopper wanted her on the once a day, I will send in the 500 mg metformin ER that comes in generic and most ins will cover. Explained she will take two tabs Qam. Advised her that if ins does not cover this, she should call her ins cust serv and find out what version of metformin they will cover. Sent in the Rx at same dose (total 1000 mg Qam) and RFs Dr Alwyn RenHopper had orig sent (but in 500 mg tabs instead of 1000 mg)

## 2015-12-15 ENCOUNTER — Telehealth: Payer: Self-pay

## 2015-12-15 NOTE — Telephone Encounter (Signed)
LMOVM for pt to call and let us know if she has seen endocrinologist in the past so we may set up referral as requested in message by Dr. Alwyn RenHopper.

## 2015-12-16 ENCOUNTER — Telehealth: Payer: Self-pay

## 2015-12-16 DIAGNOSIS — E049 Nontoxic goiter, unspecified: Secondary | ICD-10-CM

## 2015-12-16 DIAGNOSIS — E039 Hypothyroidism, unspecified: Secondary | ICD-10-CM

## 2015-12-16 NOTE — Telephone Encounter (Addendum)
IC pt with message per Dr. Alwyn RenHopper. Pt has seen endocrinologist when living in another town. Referral for evaluation with endocrinologist entered.

## 2016-01-22 ENCOUNTER — Ambulatory Visit (INDEPENDENT_AMBULATORY_CARE_PROVIDER_SITE_OTHER): Payer: BLUE CROSS/BLUE SHIELD | Admitting: Endocrinology

## 2016-01-22 ENCOUNTER — Encounter: Payer: Self-pay | Admitting: Endocrinology

## 2016-01-22 VITALS — BP 128/78 | HR 83 | Temp 98.6°F | Resp 16 | Ht 66.0 in | Wt 171.6 lb

## 2016-01-22 DIAGNOSIS — E785 Hyperlipidemia, unspecified: Secondary | ICD-10-CM | POA: Insufficient documentation

## 2016-01-22 DIAGNOSIS — E049 Nontoxic goiter, unspecified: Secondary | ICD-10-CM

## 2016-01-22 DIAGNOSIS — E1165 Type 2 diabetes mellitus with hyperglycemia: Secondary | ICD-10-CM | POA: Diagnosis not present

## 2016-01-22 LAB — T4, FREE: Free T4: 0.69 ng/dL (ref 0.60–1.60)

## 2016-01-22 LAB — TSH: TSH: 0.45 u[IU]/mL (ref 0.35–4.50)

## 2016-01-22 NOTE — Progress Notes (Signed)
Quick Note:  Please let patient know that the thyroid level is back to normal and no further action needed ______

## 2016-01-22 NOTE — Progress Notes (Addendum)
Patient ID: Frances CharsBrenda Erdman, female   DOB: [birth year], 50 y.o.   MRN: [id number]           Reason for Appointment: Goiter, new consultation    Referring physician:  Dr. Nilda SimmerKristi Smith   History of Present Illness:   The patient's thyroid enlargement was first discovered in her 4820s She thinks she had significant thyroid enlargement noticed during her pregnancy She thinks she may have had an ultrasound exam several years ago but no records are available She does not see or feel her thyroid enlargement herself   She has had difficulty with swallowing   Does not feel like she has any choking sensation in her neck or pressure in any position or when lying down. Only very rarely she feels at night that she cannot get any air for short time  She does not complain of any significant weight change, any new symptoms of palpitations, heat intolerance, shakiness or unusual change in her excessive sweating pattern She is being referred here because of her TSH level being 0.3, no recent free T4 level available  Wt Readings from Last 3 Encounters:  01/22/16 171 lb 9.6 oz (77.837 kg)  11/27/15 170 lb (77.111 kg)  10/09/15 170 lb (77.111 kg)    Lab Results  Component Value Date   FREET4 1.12 11/28/2014   TSH 0.30* 11/27/2015   TSH 0.405 11/28/2014         Medication List       This list is accurate as of: 01/22/16 11:39 AM.  Always use your most recent med list.               ADDERALL XR 30 MG 24 hr capsule  Generic drug:  amphetamine-dextroamphetamine  Take 30 mg by mouth every morning.     ALPRAZolam 0.5 MG tablet  Commonly known as:  XANAX  Take 0.5 mg by mouth at bedtime as needed for anxiety.     canagliflozin 300 MG Tabs tablet  Commonly known as:  INVOKANA  Take 1 tablet (300 mg total) by mouth daily before breakfast.     FLUoxetine 20 MG capsule  Commonly known as:  PROZAC  Take 20 mg by mouth daily.     glipiZIDE 10 MG 24 hr tablet  Commonly known as:  GLUCOTROL XL   Take 1 tablet (10 mg total) by mouth daily with breakfast.     lisinopril-hydrochlorothiazide 20-12.5 MG tablet  Commonly known as:  ZESTORETIC  Take 1 tablet by mouth daily.     metFORMIN 500 MG 24 hr tablet  Commonly known as:  GLUCOPHAGE-XR  Take 2 tablets (1,000 mg total) by mouth daily with breakfast.        Allergies:  Allergies  Allergen Reactions  . Maxipime [Cefepime]     Unknown, just remembers she was taken off of it for a particular reason    Past Medical History  Diagnosis Date  . Diabetes mellitus without complication (HCC) 08/19/2009  . Hypertension   . Attention deficit disorder (ADD)     onset age 50; [facility]  . Depression   . Anxiety     [facility]    Past Surgical History  Procedure Laterality Date  . Leg surgery      L hip fracture s/p ORIF.   Marland Kitchen. Abdominal hysterectomy      ovaries intact; no cancer.    Family History  Problem Relation Age of Onset  . Alcohol abuse Mother   . Thyroid disease  Mother   . Heart disease Sister     AMI secondary to substance abuse    Social History:  reports that she has quit smoking. She has never used smokeless tobacco. She reports that she drinks alcohol. She reports that she does not use illicit drugs.   Review of Systems  Constitutional: Negative for weight loss and reduced appetite.  HENT: Negative for trouble swallowing.   Respiratory: Negative for shortness of breath.   Cardiovascular:       May have rare palpitations, not new  Endocrine: Positive for polydipsia. Negative for heat intolerance.       She has had increased sweating childhood, not any worse recently She started having hot flashes a couple of years ago and this is better now, never on ER T  Genitourinary: Positive for nocturia.       2x  Musculoskeletal: Positive for joint pain.       Mild in the right hand, improving  Skin: Negative for itching.  Neurological: Negative for numbness and tingling.    Psychiatric/Behavioral:       Has controlled anxiety and depression   DIABETES: She has had this for several years and always has been on metformin 1000 mg a day more recently Her last A1c was 11.7 and in April Invokana was added to her regimen of metformin and glipizide She does not do any home glucose tests Tends to have thirst and increased urination but no fatigue She does not think she is motivated to watch her diabetes but is trying to do well with her diet usually with cutting back on sweets and high sugar drinks.  Today for breakfast had a ham biscuit and cereal, blood sugar 207 in the office   Examination:   BP 128/78 mmHg  Pulse 83  Temp(Src) 98.6 F (37 C)  Resp 16  Ht  (1.676 m)  Wt 171 lb 9.6 oz (77.837 kg)  BMI 27.71 kg/m2  SpO2 97%   General Appearance: pleasant, Averagely built and nourished         Eyes: No abnormal prominence or eyelid swelling.          Neck: The thyroid is enlarged about 1-1/2-2 times normal, soft, diffuse and no discrete nodule felt. There is no lymphadenopathy.     Cardiovascular: Normal  heart sounds, no murmur Respiratory:  Lungs clear Abdomen shows no hepatosplenomegaly or other mass GYN: Exam not indicated Neurological:  Monofilament sensation normal in the toes REFLEXES: at biceps are normal, absent at ankles.  Skin: no abnormal skin lesions seen, skin not unusually dry  No peripheral edema         Assessment/Plan:  Simple long-standing goiter.  This is relatively small and soft and uniform This has been present for several years and previously has not had any abnormal findings on ultrasound Since her thyroid size is relatively small and asymptomatic directing can ultrasound is needed at this time Her TSH is borderline at 0.3 She has no symptoms suggestive of hyperthyroidism  Today we will recheck her thyroid levels and decide on any further management if thyroid levels are abnormal  DIABETES: She has had poorly  controlled diabetes with history of noncompliance with self-care Not clear if she is benefiting yet from Invokana Does need to test her blood sugars, also may need follow-up with dietitian for meal planning Also does not take the optimal dose of metformin currently She is going to establish with a new PCP and she will discuss this  further She probably does not want to come back here for follow-up because of her high co-pay  HYPERLIPIDEMIA: Currently untreated, patient not aware of her high levels, she will discuss with her new PCP  Consultation report faxed to Dr. Nilda Simmer, referring physician  [facility] 01/22/2016  Addendum: TSH is back to normal, no further treatment needed  Lab Results  Component Value Date   TSH 0.45 01/22/2016

## 2016-08-02 ENCOUNTER — Other Ambulatory Visit: Payer: Self-pay | Admitting: Family Medicine

## 2016-08-02 DIAGNOSIS — E119 Type 2 diabetes mellitus without complications: Secondary | ICD-10-CM

## 2016-08-28 ENCOUNTER — Encounter: Payer: Self-pay | Admitting: Internal Medicine

## 2016-08-28 ENCOUNTER — Ambulatory Visit (INDEPENDENT_AMBULATORY_CARE_PROVIDER_SITE_OTHER): Payer: Managed Care, Other (non HMO) | Admitting: Internal Medicine

## 2016-08-28 VITALS — BP 110/68 | HR 85 | Temp 98.3°F | Ht 66.0 in | Wt 178.6 lb

## 2016-08-28 DIAGNOSIS — F329 Major depressive disorder, single episode, unspecified: Secondary | ICD-10-CM

## 2016-08-28 DIAGNOSIS — Z202 Contact with and (suspected) exposure to infections with a predominantly sexual mode of transmission: Secondary | ICD-10-CM | POA: Diagnosis not present

## 2016-08-28 DIAGNOSIS — Z20828 Contact with and (suspected) exposure to other viral communicable diseases: Secondary | ICD-10-CM

## 2016-08-28 DIAGNOSIS — F32A Depression, unspecified: Secondary | ICD-10-CM

## 2016-08-28 DIAGNOSIS — F418 Other specified anxiety disorders: Secondary | ICD-10-CM

## 2016-08-28 DIAGNOSIS — F909 Attention-deficit hyperactivity disorder, unspecified type: Secondary | ICD-10-CM

## 2016-08-28 DIAGNOSIS — E785 Hyperlipidemia, unspecified: Secondary | ICD-10-CM

## 2016-08-28 DIAGNOSIS — E119 Type 2 diabetes mellitus without complications: Secondary | ICD-10-CM | POA: Diagnosis not present

## 2016-08-28 DIAGNOSIS — F419 Anxiety disorder, unspecified: Secondary | ICD-10-CM

## 2016-08-28 LAB — HIV ANTIBODY (ROUTINE TESTING W REFLEX): HIV 1&2 Ab, 4th Generation: NONREACTIVE

## 2016-08-28 LAB — POCT GLYCOSYLATED HEMOGLOBIN (HGB A1C): HEMOGLOBIN A1C: 11.2

## 2016-08-28 MED ORDER — METHYLPHENIDATE HCL ER (OSM) 36 MG PO TBCR: 36.0000 mg | EXTENDED_RELEASE_TABLET | Freq: Every day | ORAL | 0 refills | Status: DC

## 2016-08-28 NOTE — Patient Instructions (Addendum)
Please get a mammogram as soon as possible. Please call GI to schedule a colonoscopy to screen for colon cancer.    Please make a follow up appointment with Dr. Raymondo BandKoval as soon as possible to discuss your diabetes. He has openings as soon as tomorrow.

## 2016-08-28 NOTE — Progress Notes (Signed)
Subjective:    Frances Wilson - 51 y.o. female MRN [id number]  Date of birth: 16-Aug-1966  HPI  Frances Wilson is here to establish care.  T2DM: Patient currently prescribed Metformin, Invokana, and Glypizide. Reports that she is not very adherent with her medications. She does have a meter and meter supplies but does not monitor her CBGs frequently. Only checks when she is feeling poorly. Denies recent hypoglycemia. Last CBG she remembers was about 195. She denies polyuria, vision changes, or neuropathy.   Depression/Anxiety/ADHD: Followed by the [facility] and wishes to switch doctors. Currently taking Prozac 20 mg daily, Concerta 36 mg daily, and Xanax 0.5 mg 2-3 times per week prn for anxiety. MDQ score of 7. PHQ-9 of 20. GAD-7 score of 12. Endorses occasional passing thoughts of harming herself but states that she has no active plan and these thoughts are merely fleeting. States she is safe to herself and to others.   Health Maintenance: Patient las had mammogram in Feb 2016 and states it was normal (record not available). Has never had a colonoscopy. Denies family history of breast or colon cancer. Denies breast lumps, pain, or nipple discharge. Reports some increased constipation recently and in the past has had some bright red blood on the toilet paper. Has not had any rectal bleeding in the past several months. Denies melena or abdominal pain.     -  reports that she has quit smoking. She has never used smokeless tobacco. - Review of Systems: Per HPI. - Past Medical History: Patient Active Problem List   Diagnosis Date Noted  . Hyperlipidemia 01/22/2016  . Anxiety and depression 11/29/2014  . Essential hypertension, benign 11/29/2014  . Goiter 11/29/2014  . Diabetes type 2, controlled (HCC) 09/23/2014  . ADHD (attention deficit hyperactivity disorder) 09/23/2014   - Medications: reviewed and updated   Objective:   Physical Exam BP 110/68   Pulse 85   Temp 98.3 F  (36.8 C) (Oral)   Ht 5\' 6"  (1.676 m)   Wt 178 lb 9.6 oz (81 kg)   SpO2 99%   BMI 28.83 kg/m  Gen: NAD, alert, cooperative with exam, well-appearing HEENT: NCAT, PERRL, clear conjunctiva, oropharynx clear, supple neck CV: RRR, good S1/S2, no murmur, no edema, capillary refill brisk  Resp: CTABL, no wheezes, non-labored Abd: SNTND, BS present, no guarding or organomegaly Skin: no rashes, normal turgor  Neuro: no gross deficits.  Psych: good insight, alert and oriented    Assessment & Plan:   ADHD (attention deficit hyperactivity disorder) Have placed referral to psychiatry given complicated mental health history. Continue Concerta for now.   Anxiety and depression Have placed referral to psychiatry given complicated mental health history and elevated scores today on all of 3 mental screening questionnaires. Continue Xanax and Prozac.   Diabetes type 2, controlled Uncontrolled with A1C result of 11.2. Patient admits to non-adherence with diabetic medications and does not monitor her blood sugar. Encouraged patient to start monitoring at least a fasting CBG daily as she has the needed diabetic equipment. Patient to schedule an appointment with Dr. Raymondo Band asap for adherence counseling and further medication management. Suspect patient would benefit from insulin therapy at this point in her disease course. Plan to address overdue health maintenance topics related to T2DM at follow up visit.   Hyperlipidemia Reviewed patient's lipid panel form 4/17 after office visit completed. Noted that patient has LDL level of 171 and given history of T2DM patient would benefit from statin therapy. Will discuss  at next follow up visit or have Dr. Raymondo BandKoval discuss at pharmacy visit.   Health Maintenance -HIV and Hep C screening completed today  -mammogram sheet given -colonoscopy sheet given    Marcy Sirenatherine Sidharth Leverette, D.O. 08/29/2016, 10:58 AM PGY-2, [facility]

## 2016-08-29 LAB — HEPATITIS C ANTIBODY: HCV Ab: NEGATIVE

## 2016-08-29 NOTE — Assessment & Plan Note (Signed)
Reviewed patient's lipid panel form 4/17 after office visit completed. Noted that patient has LDL level of 171 and given history of T2DM patient would benefit from statin therapy. Will discuss at next follow up visit or have Dr. Raymondo BandKoval discuss at pharmacy visit.

## 2016-08-29 NOTE — Assessment & Plan Note (Addendum)
Uncontrolled with A1C result of 11.2. Patient admits to non-adherence with diabetic medications and does not monitor her blood sugar. Encouraged patient to start monitoring at least a fasting CBG daily as she has the needed diabetic equipment. Patient to schedule an appointment with Dr. Raymondo BandKoval asap for adherence counseling and further medication management. Suspect patient would benefit from insulin therapy at this point in her disease course. Plan to address overdue health maintenance topics related to T2DM at follow up visit.

## 2016-08-29 NOTE — Assessment & Plan Note (Signed)
Have placed referral to psychiatry given complicated mental health history and elevated scores today on all of 3 mental screening questionnaires. Continue Xanax and Prozac.

## 2016-08-29 NOTE — Assessment & Plan Note (Signed)
Have placed referral to psychiatry given complicated mental health history. Continue Concerta for now.

## 2016-09-30 ENCOUNTER — Ambulatory Visit (INDEPENDENT_AMBULATORY_CARE_PROVIDER_SITE_OTHER): Payer: Managed Care, Other (non HMO) | Admitting: Pharmacist

## 2016-09-30 ENCOUNTER — Encounter: Payer: Self-pay | Admitting: Pharmacist

## 2016-09-30 ENCOUNTER — Other Ambulatory Visit: Payer: Self-pay | Admitting: Internal Medicine

## 2016-09-30 ENCOUNTER — Other Ambulatory Visit: Payer: Self-pay | Admitting: Pharmacist

## 2016-09-30 ENCOUNTER — Ambulatory Visit
Admission: RE | Admit: 2016-09-30 | Discharge: 2016-09-30 | Disposition: A | Discharge status: Home / Self Care | Payer: Managed Care, Other (non HMO) | Source: Ambulatory Visit | Attending: Family Medicine | Admitting: Family Medicine

## 2016-09-30 ENCOUNTER — Other Ambulatory Visit: Payer: Self-pay | Admitting: Family Medicine

## 2016-09-30 DIAGNOSIS — I10 Essential (primary) hypertension: Secondary | ICD-10-CM | POA: Diagnosis not present

## 2016-09-30 DIAGNOSIS — F329 Major depressive disorder, single episode, unspecified: Secondary | ICD-10-CM

## 2016-09-30 DIAGNOSIS — E119 Type 2 diabetes mellitus without complications: Secondary | ICD-10-CM

## 2016-09-30 DIAGNOSIS — F419 Anxiety disorder, unspecified: Secondary | ICD-10-CM

## 2016-09-30 DIAGNOSIS — F32A Depression, unspecified: Secondary | ICD-10-CM

## 2016-09-30 DIAGNOSIS — Z1231 Encounter for screening mammogram for malignant neoplasm of breast: Secondary | ICD-10-CM

## 2016-09-30 DIAGNOSIS — F418 Other specified anxiety disorders: Secondary | ICD-10-CM | POA: Diagnosis not present

## 2016-09-30 MED ORDER — METFORMIN HCL 1000 MG PO TABS: 1000.0000 mg | ORAL_TABLET | Freq: Two times a day (BID) | ORAL | 11 refills | Status: DC

## 2016-09-30 MED ORDER — LISINOPRIL-HYDROCHLOROTHIAZIDE 20-12.5 MG PO TABS: 1.0000 | ORAL_TABLET | Freq: Every day | ORAL | 11 refills | Status: DC

## 2016-09-30 MED ORDER — FLUOXETINE HCL 20 MG PO CAPS: 20.0000 mg | ORAL_CAPSULE | Freq: Every day | ORAL | 1 refills | Status: DC

## 2016-09-30 MED ORDER — GLIPIZIDE 10 MG PO TABS: 10.0000 mg | ORAL_TABLET | Freq: Two times a day (BID) | ORAL | 11 refills | Status: DC

## 2016-09-30 MED ORDER — DULAGLUTIDE 0.75 MG/0.5ML ~~LOC~~ SOAJ: 0.7500 mg | SUBCUTANEOUS | 1 refills | Status: DC

## 2016-09-30 NOTE — Progress Notes (Signed)
S:    Patient arrives ambulating unassisted and in good spirits.  Presents for diabetes evaluation, education, and management at the request of Dr. Earlene PlaterWallace on 08/28/2016. Patient was last seen by Primary Care Provider on 08/28/2016.   Patient reports Diabetes was diagnosed 7-8 years ago. She understands her current A1c is elevated and verbalizes a self-identified goal A1c of 6. She reports not checking her glucose regularly, but  Reports fasting glucose levels 218-355 recently.   Patient denies adherence with medications. States her Theodis Satonvokana is too expensive. Has not taken her Invokana for 3 weeks. Does report adherence with metformin and glipizide.  Current diabetes medications include: metformin 1000mg  XR daily  and glipizide 10mg  XL daily Current hypertension medications include: lisinopril-HCTZ   Patient denies hypoglycemic events.  Patient reported dietary habits: Eats 3 meals/day Breakfast: oatmeal, butter, sugar substitute OR eggs and bacon  Lunch: salads  Dinner: frozen veggies  Snacks: grapes, raisins, apples  Drinks: water, flavored water (0 calorie), diet mountain dew   Patient reports loaf of bread lasts ~1 month  Patient reported exercise habits: would like to start exercising more   Patient denies nocturia (once nightly sometimes) Patient denies neuropathy. Does report slight tingling in right large toe  Patient reports visual changes and knows her blood sugar is too high when this happens (BG >400) Patient reports self foot exams.   O:  Lab Results  Component Value Date   HGBA1C 11.2 08/28/2016   Vitals:   09/30/16 0850  BP: 102/72  Pulse: 85    Home CBGs (not fasting): 218-335, has only checked 3 times recently with daughters meter Of note, she just replaced her meter ([employer] brand) and states price of strips is reasonable.   A/P: Diabetes longstanding and is currently uncontrolled with an A1c of 11.2 . Patient denies hypoglycemic events. Patient also  denies adherence with medications (Invokana specifically, due to cost). Control is suboptimal due to cost and loss to healthcare follow-up.  Taking sub-mamixal doses of both metformin and glipizide.  To encourage compliance and improve A1c control, changes to medications as follows: Increase Metformin to 1000mg  twice daily Change to glipizide IR 10mg  twice daily.  Stop Invokana. Start taking Trulicity 0.75 mg weekly, on Mondays.  Next A1C anticipated in 2-3 months.  Patient encouraged to work on monitoring blood glucose daily in the mornings. She agreed to start with three days a week first thing in the morning before eating. Patient instructed to call the office if blood sugars are < 80.   ASCVD risk greater than 7.5%. Consider starting ASA and/or statin at follow-up appointment. Consider repeat lipid panel to establish new baseline.   Hypertension longstanding and currently controlled.  Patient reports adherence with medication. New refill for lisinopril-HCTZ sent to pharmacy.   Patient requested refill for Prozac and Concerta. Prozac refill provided, however, she will need a PCP visit for refill of Concerta. The patient is aware of this and agreeable to plan.   Lastly, patient inquired about a erythematous rash that developed around her mouth over the past week. She denies itching or pain related to the rash and has never experienced this before. She does report a history of eczema. Dr. Deirdre Priesthambliss consulted to see patient and suggested this was likely a topical allergic reaction. He suggested vaseline or plain cocoa butter topically and that she should see improvement within the week.   Written patient instructions provided.  Total time in face to face counseling 45 minutes.   Follow  up in Pharmacist Clinic Visit in one month.   Patient seen with Dietrich Pates, PharmD PGY1 Resident.

## 2016-09-30 NOTE — Patient Instructions (Addendum)
Increase your Metformin to 1000 mg twice daily   Take glipizide 10mg  twice daily   Start Trulicity 0.75 mg once weekly (on Monday)  We also sent refills for your blood pressure medication (lisinipril-HCTZ) and prozac. These doses have not changed.   Work on monitoring your blood glucose daily in the mornings. Start with three days a week first thing in the morning before you eat.   If you see blood sugars less than 80, give us a call.   Work on creating an exercise plan that works for you!   Follow-up with your primary care doctor as soon as possible. She will need to see you before you can have your Concerta refilled.   Follow-up with Dr. Raymondo BandKoval in one month.

## 2016-09-30 NOTE — Assessment & Plan Note (Signed)
Hypertension longstanding and currently controlled.  Patient reports adherence with medication. New refill for lisinopril-HCTZ sent to pharmacy.

## 2016-09-30 NOTE — Assessment & Plan Note (Signed)
Diabetes longstanding and is currently uncontrolled with an A1c of 11.2 . Patient denies hypoglycemic events. Patient also denies adherence with medications (Invokana specifically, due to cost). Control is suboptimal due to cost and loss to healthcare follow-up.  Taking sub-mamixal doses of both metformin and glipizide.  To encourage compliance and improve A1c control, changes to medications as follows: Increase Metformin to 1000mg  twice daily Change to glipizide IR 10mg  twice daily.  Stop Invokana. Start taking Trulicity 0.75 mg weekly, on Mondays.  Next A1C anticipated in 2-3 months.  Patient encouraged to work on monitoring blood glucose daily in the mornings. She agreed to start with three days a week first thing in the morning before eating. Patient instructed to call the office if blood sugars are < 80.

## 2016-10-02 ENCOUNTER — Other Ambulatory Visit: Payer: Self-pay | Admitting: Family Medicine

## 2016-10-02 DIAGNOSIS — R928 Other abnormal and inconclusive findings on diagnostic imaging of breast: Secondary | ICD-10-CM

## 2016-10-03 NOTE — Progress Notes (Signed)
Patient ID: Frances Wilson, female   DOB: 10/14/1965, 50 y.o.   MRN: [id number] Reviewed: Agree with Dr. Koval's documentation and management. 

## 2016-10-04 ENCOUNTER — Other Ambulatory Visit: Payer: Self-pay

## 2016-10-04 ENCOUNTER — Telehealth: Payer: Self-pay | Admitting: Internal Medicine

## 2016-10-04 ENCOUNTER — Other Ambulatory Visit: Payer: Self-pay | Admitting: Family Medicine

## 2016-10-04 DIAGNOSIS — R928 Other abnormal and inconclusive findings on diagnostic imaging of breast: Secondary | ICD-10-CM

## 2016-10-04 NOTE — Telephone Encounter (Signed)
LVM for Felia to call back to see about getting the orders faxed over so that I can have dr sign them and get them faxed back. Lamonte SakaiZimmerman Rumple, Steffon Gladu D, [place]\

## 2016-10-04 NOTE — Telephone Encounter (Signed)
[facility]  Pt has appt Monday 10-07-16 at 7:30. She is needing orders signed.  Please advise

## 2016-10-07 ENCOUNTER — Ambulatory Visit
Admission: RE | Admit: 2016-10-07 | Discharge: 2016-10-07 | Disposition: A | Discharge status: Home / Self Care | Payer: Managed Care, Other (non HMO) | Source: Ambulatory Visit | Attending: Family Medicine | Admitting: Family Medicine

## 2016-10-07 DIAGNOSIS — R928 Other abnormal and inconclusive findings on diagnostic imaging of breast: Secondary | ICD-10-CM

## 2016-10-09 ENCOUNTER — Encounter: Payer: Self-pay | Admitting: Internal Medicine

## 2016-10-09 ENCOUNTER — Ambulatory Visit (INDEPENDENT_AMBULATORY_CARE_PROVIDER_SITE_OTHER): Payer: Managed Care, Other (non HMO) | Admitting: Internal Medicine

## 2016-10-09 VITALS — BP 124/60 | HR 95 | Temp 99.7°F | Ht 66.0 in | Wt 180.0 lb

## 2016-10-09 DIAGNOSIS — F418 Other specified anxiety disorders: Secondary | ICD-10-CM | POA: Diagnosis not present

## 2016-10-09 DIAGNOSIS — I10 Essential (primary) hypertension: Secondary | ICD-10-CM | POA: Diagnosis not present

## 2016-10-09 DIAGNOSIS — F32A Depression, unspecified: Secondary | ICD-10-CM

## 2016-10-09 DIAGNOSIS — F329 Major depressive disorder, single episode, unspecified: Secondary | ICD-10-CM

## 2016-10-09 DIAGNOSIS — E119 Type 2 diabetes mellitus without complications: Secondary | ICD-10-CM | POA: Diagnosis not present

## 2016-10-09 DIAGNOSIS — E785 Hyperlipidemia, unspecified: Secondary | ICD-10-CM

## 2016-10-09 DIAGNOSIS — F419 Anxiety disorder, unspecified: Secondary | ICD-10-CM

## 2016-10-09 LAB — COMPLETE METABOLIC PANEL WITH GFR
ALT: 12 U/L (ref 6–29)
AST: 12 U/L (ref 10–35)
Albumin: 4.1 g/dL (ref 3.6–5.1)
Alkaline Phosphatase: 65 U/L (ref 33–130)
BILIRUBIN TOTAL: 0.3 mg/dL (ref 0.2–1.2)
BUN: 11 mg/dL (ref 7–25)
CHLORIDE: 99 mmol/L (ref 98–110)
CO2: 27 mmol/L (ref 20–31)
CREATININE: 0.75 mg/dL (ref 0.50–1.05)
Calcium: 10 mg/dL (ref 8.6–10.4)
GFR, Est African American: >89 mL/min (ref >=60)
GFR, Est Non African American: >89 mL/min (ref >=60)
Glucose, Bld: 170 mg/dL — ABNORMAL HIGH (ref 65–99)
Potassium: 3.7 mmol/L (ref 3.5–5.3)
SODIUM: 136 mmol/L (ref 135–146)
TOTAL PROTEIN: 7.3 g/dL (ref 6.1–8.1)

## 2016-10-09 LAB — LIPID PANEL
Cholesterol: 258 mg/dL — ABNORMAL HIGH (ref <200)
HDL: 38 mg/dL — ABNORMAL LOW (ref >50)
LDL CALC: 182 mg/dL — AB (ref <100)
Total CHOL/HDL Ratio: 6.8 Ratio — ABNORMAL HIGH (ref <5.0)
Triglycerides: 190 mg/dL — ABNORMAL HIGH (ref <150)
VLDL: 38 mg/dL — AB (ref <30)

## 2016-10-09 MED ORDER — METHYLPHENIDATE HCL ER (OSM) 36 MG PO TBCR: 36.0000 mg | EXTENDED_RELEASE_TABLET | Freq: Every day | ORAL | 0 refills | Status: DC

## 2016-10-09 MED ORDER — ALPRAZOLAM 0.5 MG PO TABS: 0.5000 mg | ORAL_TABLET | Freq: Every evening | ORAL | 0 refills | Status: DC | PRN

## 2016-10-09 MED ORDER — ATORVASTATIN CALCIUM 40 MG PO TABS: 40.0000 mg | ORAL_TABLET | Freq: Every day | ORAL | 0 refills | Status: DC

## 2016-10-09 MED ORDER — FLUOXETINE HCL 20 MG PO CAPS: 20.0000 mg | ORAL_CAPSULE | Freq: Every day | ORAL | 5 refills | Status: DC

## 2016-10-09 MED ORDER — ASPIRIN EC 81 MG PO TBEC: 81.0000 mg | DELAYED_RELEASE_TABLET | Freq: Every day | ORAL | 0 refills | Status: DC

## 2016-10-09 NOTE — Assessment & Plan Note (Addendum)
Continue with current regimen and monitoring of CBGs. Referral to nutrition placed. Patient has an appt scheduled with Dr. Gerilyn PilgrimSykes in April. Return in 1 month for follow up. Call office if blood sugars <80.

## 2016-10-09 NOTE — Patient Instructions (Signed)
I will call you with your lab results.   Start the Atorvastatin and baby aspirin to help prevent strokes and heart attacks.   Please continue to check your blood sugars. Return in 1 month for follow up.   Good to see you today!  Dr. Earlene PlaterWallace

## 2016-10-09 NOTE — Progress Notes (Signed)
   Subjective:    Joanne CharsBrenda Cornforth - 51 y.o. female MRN [id number]  Date of birth: 1966/01/30  HPI  Joanne CharsBrenda Gfeller is here for follow up of chronic medical conditions.  T2DM: Patient seen in pharmacy clinic last week for medication management. Metformin was increased to 1000 mg BID. Glipizide was changed to IR 10 mg BID.  Invokana was discontinued due to cost and Trulicity 0.75 mg weekly was started. She has been checking her fasting CBGs three times per week. Fasting numbers have been in the low 200s and most recently was 190, which she reports is the first time her CBG has been below 200 in a long time. She has been trying to make diet changes. Reports she has stocked her freezer full of vegetables and it is the first time she has purchased vegetables in a long time.    HLD: Patient has not been on a statin in the past. Does not take a baby ASA. Was unaware that LDL was elevated on labs in 4/17.   Anxiety/Depression: Reports good control of her symptoms. Denies SI. Reports that she uses Xanax about 3 times per week to help her sleep if she has had a stressful day. 30 pills usually lasts her a little over 2 months.   -  reports that she quit smoking about 16 years ago. Her smoking use included Cigarettes. She started smoking about 35 years ago. She has a 27.00 pack-year smoking history. She has never used smokeless tobacco. - Review of Systems: Per HPI. - Past Medical History: Patient Active Problem List   Diagnosis Date Noted  . Hyperlipidemia 01/22/2016  . Anxiety and depression 11/29/2014  . Essential hypertension, benign 11/29/2014  . Goiter 11/29/2014  . Diabetes type 2, controlled (HCC) 09/23/2014  . ADHD (attention deficit hyperactivity disorder) 09/23/2014   - Medications: reviewed and updated   Objective:   Physical Exam BP 124/60   Pulse 95   Temp 99.7 F (37.6 C) (Oral)   Ht 5\' 6"  (1.676 m)   Wt 180 lb (81.6 kg)   SpO2 97%   BMI 29.05 kg/m  Gen: NAD, alert,  cooperative with exam, well-appearing CV: RRR, good S1/S2, no murmur, no edema, capillary refill brisk  Resp: CTABL, no wheezes, non-labored    Assessment & Plan:   Anxiety and depression Referral to psychiatry pending. Discussed additive potential of Xanax. Would consider other sleep aids in the future as it seems patient is mostly using this to aid in sleep. Could also consider increase in Prozac as patient is only taking 20 mg.   Hyperlipidemia ASCVD risk >7.5%. Will repeat lipid panel today to establish baseline and obtain CMET. Have prescribed Atorvastatin 40 mg and ASA 81 mg for risk reduction.   Diabetes type 2, controlled Continue with current regimen and monitoring of CBGs. Return in 1 month for follow up. Call office if blood sugars <80.     Marcy Sirenatherine Mckale Haffey, D.O. 10/09/2016, 4:39 PM PGY-2, [facility]

## 2016-10-09 NOTE — Assessment & Plan Note (Signed)
ASCVD risk >7.5%. Will repeat lipid panel today to establish baseline and obtain CMET. Have prescribed Atorvastatin 40 mg and ASA 81 mg for risk reduction.

## 2016-10-09 NOTE — Assessment & Plan Note (Signed)
Referral to psychiatry pending. Discussed additive potential of Xanax. Would consider other sleep aids in the future as it seems patient is mostly using this to aid in sleep. Could also consider increase in Prozac as patient is only taking 20 mg.

## 2016-10-28 ENCOUNTER — Ambulatory Visit (INDEPENDENT_AMBULATORY_CARE_PROVIDER_SITE_OTHER): Payer: Managed Care, Other (non HMO) | Admitting: Pharmacist

## 2016-10-28 ENCOUNTER — Encounter: Payer: Self-pay | Admitting: Pharmacist

## 2016-10-28 DIAGNOSIS — E119 Type 2 diabetes mellitus without complications: Secondary | ICD-10-CM | POA: Diagnosis not present

## 2016-10-28 MED ORDER — ATORVASTATIN CALCIUM 40 MG PO TABS: 40.0000 mg | ORAL_TABLET | Freq: Every day | ORAL | 3 refills | Status: DC

## 2016-10-28 MED ORDER — METFORMIN HCL ER 500 MG PO TB24: 1000.0000 mg | ORAL_TABLET | Freq: Two times a day (BID) | ORAL | 11 refills | Status: DC

## 2016-10-28 MED ORDER — DULAGLUTIDE 1.5 MG/0.5ML ~~LOC~~ SOAJ: 1.5000 mg | SUBCUTANEOUS | 3 refills | Status: DC

## 2016-10-28 NOTE — Assessment & Plan Note (Signed)
Diabetes longstanding currently improving in control. Last A1c was 11.2 in February. Patient denies hypoglycemic events and is able to verbalize appropriate hypoglycemia management plan. Patient reports adherence with medication with occasional missed doses. Pt estimates she takes 11/14 doses of her BID drugs. Control is suboptimal due to some issues with compliance and titrating medicines to maximum dose. Increased dose of Trulicity (dulaglutide) to 1.5 mg weekly in four weeks once current supply is exhausted by taking at 0.75 mg weekly. Changed dose of metformin from 1000 mg IR BID to 1000 mg XR BID to help with GI sx. Next A1C anticipated at next visit in late April.

## 2016-10-28 NOTE — Progress Notes (Signed)
Patient ID: Frances Wilson, female   DOB: 07/17/1966, 50 y.o.   MRN: [id number] Reviewed: Agree with Dr. Koval's documentation and management. 

## 2016-10-28 NOTE — Patient Instructions (Signed)
Thank you for coming to see us today!  Start taking the metformin extended-release 500 mg. You will take two of these twice a day. Continue taking your glipizide as you have been. Continue your current dose of Trulicity until you exhaust your current supply then increase the dose of your Trulicity to 1.5 mg weekly.   Follow up with Dr. Raymondo BandKoval in 6 weeks (End of April).

## 2016-10-28 NOTE — Progress Notes (Signed)
    S:     Chief Complaint  Patient presents with  . Medication Management    diabetes    Patient arrives in good spirits.  Presents for diabetes evaluation, education, and management as a follow up to a previous visit with Dr. Raymondo BandKoval. Patient was last seen by Primary Care Provider Dr. Earlene PlaterWallace on 10/09/2016.  Patient reports her oral DM meds cause her stomach to be "tore up" with nausea and diarrhea. She thinks this may be from a switch from metformin XR to IR. Patient reports Diabetes was diagnosed 7-8 years ago.   Family/Social History:   Patient reports adherence with medications.  Current diabetes medications include: Metformin IR 500 mg Take two BID, glipizide IR 10 mg BID, Trulicity 0.75 mg SQ weekly Current hypertension medications include: Lisinopril/HCTZ 20/12.5  Patient denies hypoglycemic events.  O:  Physical Exam   ROS Multiple episodes of GI upset with N/V/D most likely related to metformin switch from XR to IR.   Lab Results  Component Value Date   HGBA1C 11.2 08/28/2016   There were no vitals filed for this visit.  2 hour post-prandial/random CBG: 100s-200s  10 year ASCVD risk: 22.9%  A/P: Diabetes longstanding currently improving in control. Last A1c was 11.2 in February. Patient denies hypoglycemic events and is able to verbalize appropriate hypoglycemia management plan. Patient reports adherence with medication with occasional missed doses. Pt estimates she takes 11/14 doses of her BID drugs. Control is suboptimal due to some issues with compliance and titrating medicines to maximum dose. Increased dose of Trulicity (dulaglutide) to 1.5 mg weekly in four weeks once current supply is exhausted by taking at 0.75 mg weekly. Changed dose of metformin from 1000 mg IR BID to 1000 mg XR BID to help with GI sx. Next A1C anticipated at next visit in late April.  ASCVD risk greater than 7.5%. Continued Aspirin 81 mg and Initiated atorvastatin 40 mg (Pt has previously  been prescribed this but has not yet picked up.).   Hypertension longstanding currently well-controlled.  Patient reports adherence with medication. Control is adequate at this time.  Written patient instructions provided.  Total time in face to face counseling 20 minutes.   Follow up in Pharmacist Clinic Visit late April.   Patient seen with Elta GuadeloupeJustin Crowder, PharmD Candidate, [place]reezeSarah Mislan, PharmD Candidate.

## 2016-10-31 ENCOUNTER — Encounter: Payer: Managed Care, Other (non HMO) | Attending: Family Medicine | Admitting: Registered"

## 2016-10-31 DIAGNOSIS — Z713 Dietary counseling and surveillance: Secondary | ICD-10-CM | POA: Diagnosis not present

## 2016-10-31 DIAGNOSIS — E119 Type 2 diabetes mellitus without complications: Secondary | ICD-10-CM

## 2016-10-31 NOTE — Progress Notes (Signed)
Diabetes Self-Management Education  Visit Type: First/Initial  Appt. Start Time: 0945 Appt. End Time: 1448  10/31/2016  [name], identified by name and date of birth, is a 51 y.o. female with a diagnosis of Diabetes: Type 2.   ASSESSMENT Patient is a high school history Pharmacist, hospital. Patient reports that she feels a financial burden with all the medications she is on. She is on 3 medications for diabetes (not insulin). Her A1C reached a high of 14 during the year she lived in [facility] and was not keeping up with her medical care. When she returned she started getting caught up in health care measures.   Patient reports that she also has fatigue and some depression. Patient states she has recently lost weight by cutting back on snacking and eating out less often. Per her chart she has elevated lipid levels which she reports is hereditary.  Patient reports that her metformin prescription was filled incorrectly and didn't notice until after 2 weeks of taking 4000 mg metformin per day. She states she will let her doctor know.      Diabetes Self-Management Education - 10/31/16 0951      Visit Information   Visit Type First/Initial     Initial Visit   Diabetes Type Type 2   Are you currently following a meal plan? No   Are you taking your medications as prescribed? Yes   Date Diagnosed 2009?     Health Coping   How would you rate your overall health? Fair     Psychosocial Assessment   Patient Belief/Attitude about Diabetes Defeat/Burnout  spends a lot of time reading labels, felt very restricted in what she thought she can eat   How often do you need to have someone help you when you read instructions, pamphlets, or other written materials from your doctor or pharmacy? 1 - Never   What is the last grade level you completed in school? bachelors     Complications   Last HgB A1C per patient/outside source 11.2 %  highest was 14 when in [facility] for a year   How often do you check your  blood sugar? 3-4 times / week  fasting & postprandial   Fasting Blood glucose range (mg/dL) 130-179  around 150   Postprandial Blood glucose range (mg/dL) 130-179  164   Number of hypoglycemic episodes per month 0   Have you had a dilated eye exam in the past 12 months? Yes   Have you had a dental exam in the past 12 months? Yes   Are you checking your feet? Yes   How many days per week are you checking your feet? 7     Dietary Intake   Breakfast oatmeal, walnuts, coffee cream and sugar sub OR 2x week country ham & egg or sausage.   Snack (morning) fruit OR cereal OR PB crackers   Lunch salad, with protein OR chicken & veggies and occassionally with have rice   Snack (afternoon) same as morning   Dinner grapes & cheese OR other light meal   Snack (evening) fruit something   Beverage(s) flavored water (sugar-free)     Exercise   Exercise Type ADL's   How many days per week to you exercise? 0   How many minutes per day do you exercise? 0   Total minutes per week of exercise 0     Patient Education   Previous Diabetes Education Yes (please comment)  a long time ago met with a dietitian  Disease state  Definition of diabetes, type 1 and 2, and the diagnosis of diabetes   Nutrition management  Role of diet in the treatment of diabetes and the relationship between the three main macronutrients and blood glucose level;Food label reading, portion sizes and measuring food.;Carbohydrate counting   Physical activity and exercise  Role of exercise on diabetes management, blood pressure control and cardiac health.   Monitoring Identified appropriate SMBG and/or A1C goals.   Acute complications Taught treatment of hypoglycemia - the 15 rule.   Chronic complications Relationship between chronic complications and blood glucose control;Lipid levels, blood glucose control and heart disease     Individualized Goals (developed by patient)   Nutrition General guidelines for healthy choices and  portions discussed   Physical Activity Exercise 3-5 times per week     Outcomes   Expected Outcomes Demonstrated interest in learning. Expect positive outcomes   Future DMSE PRN   Program Status Completed      Individualized Plan for Diabetes Self-Management Training:   Learning Objective:  Patient will have a greater understanding of diabetes self-management. Patient education plan is to attend individual and/or group sessions per assessed needs and concerns.  Patient Instructions  Regarding depression and fatigue, ask doctor about looking into Vit D levels After next lipid test, if still high may consider further limiting sausage, bacon, and egg yolks. (refer to Types of Fat handout for saturated fat sources) Include protein with meals and snacks Aim for 2-3 carb choices (30-45 g) per meal and 0-1 choices per snack Consider incorporating more physical activity 3-5 x week  Expected Outcomes:  Demonstrated interest in learning. Expect positive outcomes  Education material provided: Living Well with Diabetes, Food label handouts, A1C conversion sheet, My Plate, Snack sheet and Carbohydrate counting sheet Types of Fat.  If problems or questions, patient to contact team via:  Phone and Email  Future DSME appointment: PRN

## 2016-11-14 ENCOUNTER — Ambulatory Visit (INDEPENDENT_AMBULATORY_CARE_PROVIDER_SITE_OTHER): Payer: Managed Care, Other (non HMO) | Admitting: Psychiatry

## 2016-11-14 ENCOUNTER — Encounter (HOSPITAL_COMMUNITY): Payer: Self-pay | Admitting: Psychiatry

## 2016-11-14 VITALS — BP 118/66 | HR 82 | Ht 66.75 in | Wt 182.0 lb

## 2016-11-14 DIAGNOSIS — F901 Attention-deficit hyperactivity disorder, predominantly hyperactive type: Secondary | ICD-10-CM

## 2016-11-14 DIAGNOSIS — Z7982 Long term (current) use of aspirin: Secondary | ICD-10-CM | POA: Diagnosis not present

## 2016-11-14 DIAGNOSIS — Z79899 Other long term (current) drug therapy: Secondary | ICD-10-CM | POA: Diagnosis not present

## 2016-11-14 DIAGNOSIS — F418 Other specified anxiety disorders: Secondary | ICD-10-CM

## 2016-11-14 DIAGNOSIS — F32A Depression, unspecified: Secondary | ICD-10-CM

## 2016-11-14 DIAGNOSIS — Z87891 Personal history of nicotine dependence: Secondary | ICD-10-CM

## 2016-11-14 DIAGNOSIS — F4312 Post-traumatic stress disorder, chronic: Secondary | ICD-10-CM | POA: Diagnosis not present

## 2016-11-14 DIAGNOSIS — F419 Anxiety disorder, unspecified: Secondary | ICD-10-CM

## 2016-11-14 DIAGNOSIS — F329 Major depressive disorder, single episode, unspecified: Secondary | ICD-10-CM

## 2016-11-14 MED ORDER — FLUOXETINE HCL 40 MG PO CAPS: 40.0000 mg | ORAL_CAPSULE | Freq: Every day | ORAL | 1 refills | Status: DC

## 2016-11-14 MED ORDER — METHYLPHENIDATE HCL ER (OSM) 36 MG PO TBCR: 36.0000 mg | EXTENDED_RELEASE_TABLET | Freq: Every day | ORAL | 0 refills | Status: DC

## 2016-11-14 MED ORDER — ALPRAZOLAM 0.5 MG PO TABS: 0.5000 mg | ORAL_TABLET | Freq: Every day | ORAL | 0 refills | Status: DC | PRN
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
## 2016-11-21 ENCOUNTER — Encounter (HOSPITAL_COMMUNITY): Payer: Self-pay | Admitting: Licensed Clinical Social Worker

## 2016-11-21 ENCOUNTER — Ambulatory Visit (INDEPENDENT_AMBULATORY_CARE_PROVIDER_SITE_OTHER): Payer: Managed Care, Other (non HMO) | Admitting: Licensed Clinical Social Worker

## 2016-11-21 DIAGNOSIS — F4312 Post-traumatic stress disorder, chronic: Secondary | ICD-10-CM

## 2016-11-21 DIAGNOSIS — F32A Depression, unspecified: Secondary | ICD-10-CM

## 2016-11-21 DIAGNOSIS — F329 Major depressive disorder, single episode, unspecified: Secondary | ICD-10-CM

## 2016-11-21 DIAGNOSIS — F419 Anxiety disorder, unspecified: Secondary | ICD-10-CM

## 2016-11-21 DIAGNOSIS — F418 Other specified anxiety disorders: Secondary | ICD-10-CM
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
## 2016-11-28 ENCOUNTER — Ambulatory Visit (INDEPENDENT_AMBULATORY_CARE_PROVIDER_SITE_OTHER): Payer: Managed Care, Other (non HMO) | Admitting: Family Medicine

## 2016-11-28 VITALS — BP 110/62 | Temp 98.9°F | Wt 181.2 lb

## 2016-11-28 DIAGNOSIS — E119 Type 2 diabetes mellitus without complications: Secondary | ICD-10-CM

## 2016-11-28 DIAGNOSIS — R300 Dysuria: Secondary | ICD-10-CM

## 2016-11-28 LAB — POCT UA - MICROSCOPIC ONLY

## 2016-11-28 LAB — POCT URINALYSIS DIP (MANUAL ENTRY)
Bilirubin, UA: NEGATIVE
Blood, UA: NEGATIVE
Glucose, UA: 100 mg/dL — AB
Nitrite, UA: NEGATIVE
PH UA: 7 (ref 5.0–8.0)
Protein Ur, POC: NEGATIVE mg/dL
SPEC GRAV UA: 1.02 (ref 1.010–1.025)
UROBILINOGEN UA: 1 U/dL

## 2016-11-28 LAB — POCT GLYCOSYLATED HEMOGLOBIN (HGB A1C): HEMOGLOBIN A1C: 8.8

## 2016-11-28 MED ORDER — NITROFURANTOIN MONOHYD MACRO 100 MG PO CAPS: 100.0000 mg | ORAL_CAPSULE | Freq: Two times a day (BID) | ORAL | 0 refills | Status: DC

## 2016-11-28 NOTE — Patient Instructions (Addendum)
Thank you for coming in today!  Your blood sugar readings are looking better. Your A1c today was 8.8. Keep taking Trulicity 1.5 mg once a week and continue with metformin XR and glipizide.   Call the pharmacy clinic if your blood sugar is above 200 at least 3 times/week or any values less than 80.   Please schedule a follow up visit with pharmacy clinic for 4 weeks.  ________________________________________________________________________________________________  Your symptoms are consistent with a UTI. I have sent a prescription for an antibiotic to take twice daily for five days. Please let us know if your symptoms worsen or fail to improve.   Dr. Caroleen Hamman  Nitrofurantoin tablets or capsules What is this medicine? NITROFURANTOIN (nye troe fyoor AN toyn) is an antibiotic. It is used to treat urinary tract infections. This medicine may be used for other purposes; ask your health care provider or pharmacist if you have questions. COMMON BRAND NAME(S): Macrobid, Macrodantin, Urotoin What should I tell my health care provider before I take this medicine? They need to know if you have any of these conditions: -anemia -diabetes -glucose-6-phosphate dehydrogenase deficiency -kidney disease -liver disease -lung disease -other chronic illness -an unusual or allergic reaction to nitrofurantoin, other antibiotics, other medicines, foods, dyes or preservatives -pregnant or trying to get pregnant -breast-feeding How should I use this medicine? Take this medicine by mouth with a glass of water. Follow the directions on the prescription label. Take this medicine with food or milk. Take your doses at regular intervals. Do not take your medicine more often than directed. Do not stop taking except on your doctor's advice. Talk to your pediatrician regarding the use of this medicine in children. While this drug may be prescribed for selected conditions, precautions do apply. Overdosage: If you think you  have taken too much of this medicine contact a poison control center or emergency room at once. NOTE: This medicine is only for you. Do not share this medicine with others. What if I miss a dose? If you miss a dose, take it as soon as you can. If it is almost time for your next dose, take only that dose. Do not take double or extra doses. What may interact with this medicine? -antacids containing magnesium trisilicate -probenecid -quinolone antibiotics like ciprofloxacin, lomefloxacin, norfloxacin and ofloxacin -sulfinpyrazone This list may not describe all possible interactions. Give your health care provider a list of all the medicines, herbs, non-prescription drugs, or dietary supplements you use. Also tell them if you smoke, drink alcohol, or use illegal drugs. Some items may interact with your medicine. What should I watch for while using this medicine? Tell your doctor or health care professional if your symptoms do not improve or if you get new symptoms. Drink several glasses of water a day. If you are taking this medicine for a long time, visit your doctor for regular checks on your progress. If you are diabetic, you may get a false positive result for sugar in your urine with certain brands of urine tests. Check with your doctor. What side effects may I notice from receiving this medicine? Side effects that you should report to your doctor or health care professional as soon as possible: -allergic reactions like skin rash or hives, swelling of the face, lips, or tongue -chest pain -cough -difficulty breathing -dizziness, drowsiness -fever or infection -joint aches or pains -pale or blue-tinted skin -redness, blistering, peeling or loosening of the skin, including inside the mouth -tingling, burning, pain, or numbness in hands  or feet -unusual bleeding or bruising -unusually weak or tired -yellowing of eyes or skin Side effects that usually do not require medical attention (report  to your doctor or health care professional if they continue or are bothersome): -dark urine -diarrhea -headache -loss of appetite -nausea or vomiting -temporary hair loss This list may not describe all possible side effects. Call your doctor for medical advice about side effects. You may report side effects to FDA at 1-800-FDA-1088. Where should I keep my medicine? Keep out of the reach of children. Store at room temperature between 15 and 30 degrees C (59 and 86 degrees F). Protect from light. Throw away any unused medicine after the expiration date. NOTE: This sheet is a summary. It may not cover all possible information. If you have questions about this medicine, talk to your doctor, pharmacist, or health care provider.  2018 Elsevier/Gold Standard (2008-02-24 15:56:47)

## 2016-11-28 NOTE — Progress Notes (Signed)
    S:     Chief Complaint  Patient presents with  . Medication Management    Patient arrives in good spirits.  Presents for diabetes evaluation, education, and management.  Patient was last seen by pharmacy clinic on 10/28/2016. She reports feeling nauseated and cold today. She states she is urinating frequently and thinks she may have a UTI, her urine is yellow and cloudy with lower abdominal pain. Denies burning with urination and urgency. Reports increased urinary frequency. Denies back tenderness. She denies taking any OTC medications to treat urinary symptoms.  She reports accidentally taking 4000 mg of metformin once a day for 3 weeks with severe nausea/vomiting/diarrhea side effects. Lowest reading 109, no reports of hypoglycemia. Patient reports Diabetes was diagnosed 7-8 years ago.    Patient reports adherence with medications, misses her night time doses of medications about 4 times a week.  Current diabetes medications include: Trulicity 0.75 mg, metformin 1000 mg BID, glipizide 10 mg BID Current hypertension medications include: lisinopril-HCTZ 20-12.5 mg,  Patient reported dietary habits: Eats 2-3 meals/day. Patient reported exercise habits: none    Patient reports nocturia, at least 3 times a night.  Patient denies neuropathy. Patient denies visual changes. Patient reports self foot exams.   O:  Physical Exam  Vitals reviewed.  Review of Systems  Constitutional: Positive for chills.  Genitourinary: Positive for dysuria and frequency.     Lab Results  Component Value Date   HGBA1C 8.8 11/28/2016   Vitals:   11/28/16 1607  BP: 110/62  Temp: 98.9 F (37.2 C)   Home fasting CBG: 110-177   A/P: Diabetes longstanding currently controlled with improved CBGs ranging 110-177, but not at goal of a1c 8. Patient denies hypoglycemic events and is able to verbalize appropriate hypoglycemia management plan. Patient reports adherence with medications. Control is improving  with recent A1c of 8.8, down from 11.2 in January 2018. Initiate Trulicity to 1.5 mg once weekly. Continue with metformin 1000 mg BID and glipizide 10 mg BID.   HTN: Hypertension longstanding currently controlled with BP 110/62.  Patient reports adherence with medication. Continue lisinopril-HCTZ 20-12.5.  UTI: Seen by Dr. Caroleen Hamman for symptomatic UTI. Initiate Macrobid 100 mg BID for 5 days.   Written patient instructions provided.  Total time in face to face counseling 40 minutes.   Follow up in Pharmacist Clinic Visit one month.   Patient seen with Coolidge Breeze, PharmD Candidate, Hazle Nordmann, PharmD, BCPS, PGY2 Resident.

## 2016-11-29 LAB — BASIC METABOLIC PANEL
BUN / CREAT RATIO: 14 (ref 9–23)
BUN: 14 mg/dL (ref 6–24)
CO2: 23 mmol/L (ref 18–29)
Calcium: 10.5 mg/dL — ABNORMAL HIGH (ref 8.7–10.2)
Chloride: 93 mmol/L — ABNORMAL LOW (ref 96–106)
Creatinine, Ser: 0.97 mg/dL (ref 0.57–1.00)
GFR, EST AFRICAN AMERICAN: 78 mL/min/{1.73_m2} (ref >59)
GFR, EST NON AFRICAN AMERICAN: 67 mL/min/{1.73_m2} (ref >59)
Glucose: 179 mg/dL — ABNORMAL HIGH (ref 65–99)
POTASSIUM: 4 mmol/L (ref 3.5–5.2)
SODIUM: 136 mmol/L (ref 134–144)

## 2016-11-29 LAB — CBC WITH DIFFERENTIAL/PLATELET
BASOS ABS: 0 10*3/uL (ref 0.0–0.2)
BASOS: 0 %
EOS (ABSOLUTE): 0.2 10*3/uL (ref 0.0–0.4)
Eos: 2 %
Hematocrit: 38.6 % (ref 34.0–46.6)
Hemoglobin: 12.9 g/dL (ref 11.1–15.9)
IMMATURE GRANS (ABS): 0 10*3/uL (ref 0.0–0.1)
IMMATURE GRANULOCYTES: 0 %
LYMPHS: 28 %
Lymphocytes Absolute: 3.8 10*3/uL — ABNORMAL HIGH (ref 0.7–3.1)
MCH: 27.4 pg (ref 26.6–33.0)
MCHC: 33.4 g/dL (ref 31.5–35.7)
MCV: 82 fL (ref 79–97)
MONOCYTES: 6 %
Monocytes Absolute: 0.7 10*3/uL (ref 0.1–0.9)
NEUTROS ABS: 8.7 10*3/uL — AB (ref 1.4–7.0)
NEUTROS PCT: 64 %
Platelets: 369 10*3/uL (ref 150–379)
RBC: 4.7 x10E6/uL (ref 3.77–5.28)
RDW: 13.7 % (ref 12.3–15.4)
WBC: 13.5 10*3/uL — ABNORMAL HIGH (ref 3.4–10.8)

## 2016-11-29 NOTE — Progress Notes (Signed)
Subjective:     Patient ID: Frances Wilson, female   DOB: Feb 20, 1966, 51 y.o.   MRN: [id number]  HPI Frances Wilson is a 50yo female presenting to pharmacy clinic and requested appointment due to possible UTI.  Notes urinary symptoms for 3-4 weeks. Notes increased urinary frequency and suprapubic pain. Denies dysuria, urgency. Denies flank pain. Denies fever. Has history of UTIs and states this feels similar to prior. Has been drinking Cranberry juice, which has helped. Significant reaction to Cephalosporins noted in chart. In a monogamous relationship and does think she has been exposed to STDs. LMP more than 5 years ago following partial hysterectomy. Nonsmoker.   Review of Systems Per HPI    Objective:   Physical Exam  Constitutional: She appears well-developed and well-nourished. No distress.  Cardiovascular: Normal rate and regular rhythm.   No murmur heard. Pulmonary/Chest: Effort normal. No respiratory distress. She has no wheezes.  Abdominal: Soft. She exhibits no distension.  Suprapubic tenderness. No CVA tenderness noted.   Musculoskeletal: She exhibits no edema.  Skin: No rash noted.  Psychiatric: She has a normal mood and affect. Her behavior is normal.      Assessment and Plan:     1. Dysuria Urinalysis with trace leukocytes, 1+ bacteria on microscopy. Frances Wilson reports 2+ symptoms of UTI and states it is consistent with prior UTIs. Will initiate treatment for UTI and monitor. Keflex contraindicated due to allergy to cephalosporins. Macrobid prescribed. Follow up if no improvement.

## 2016-12-10 ENCOUNTER — Ambulatory Visit: Payer: Managed Care, Other (non HMO) | Admitting: Pharmacist

## 2016-12-23 ENCOUNTER — Ambulatory Visit (INDEPENDENT_AMBULATORY_CARE_PROVIDER_SITE_OTHER): Payer: Managed Care, Other (non HMO) | Admitting: Licensed Clinical Social Worker

## 2016-12-23 DIAGNOSIS — F4312 Post-traumatic stress disorder, chronic: Secondary | ICD-10-CM

## 2016-12-24 ENCOUNTER — Encounter (HOSPITAL_COMMUNITY): Payer: Self-pay | Admitting: Licensed Clinical Social Worker

## 2016-12-24 NOTE — Progress Notes (Signed)
   THERAPIST PROGRESS NOTE  Session Time: 4:10-5pm  Participation Level: Active  Behavioral Response: CasualAlertAnxious  Type of Therapy: Individual Therapy  Treatment Goals addressed: Coping  Interventions: CBT  Summary: Joanne CharsBrenda Northampton is a 51 y.o. female. Today is the first day of therapy. Spent a considerable amount of time building a trusting therapeutic relationship and getting background information. Pt discussed her psychiatric symptoms. Taught pt relationship circles and how she can choose who she wants in her life. Also taught her boundaries and role played how to use them.  Pt was receptive to skills taught today.   Suicidal/Homicidal: Nowithout intent/plan  Therapist Response:  Assessed pt's current functioning for baseline. Assisted pt processing a trusting therapeutic relationship, relationship circles and boundaries. Assisted pt processing for the management of her stressors.  Plan: Return again in 2 weeks. Pt will call back to schedule an appt. Front desk staff was already gone.  Diagnosis: Axis I: Chronic PTSD   MACKENZIE,LISBETH S, LCAS 12/23/16

## 2017-01-14 ENCOUNTER — Ambulatory Visit (INDEPENDENT_AMBULATORY_CARE_PROVIDER_SITE_OTHER): Payer: Managed Care, Other (non HMO) | Admitting: Psychiatry

## 2017-01-14 ENCOUNTER — Encounter (HOSPITAL_COMMUNITY): Payer: Self-pay | Admitting: Psychiatry

## 2017-01-14 VITALS — BP 128/74 | HR 76 | Ht 67.0 in | Wt 178.0 lb

## 2017-01-14 DIAGNOSIS — F329 Major depressive disorder, single episode, unspecified: Secondary | ICD-10-CM

## 2017-01-14 DIAGNOSIS — F901 Attention-deficit hyperactivity disorder, predominantly hyperactive type: Secondary | ICD-10-CM | POA: Diagnosis not present

## 2017-01-14 DIAGNOSIS — Z811 Family history of alcohol abuse and dependence: Secondary | ICD-10-CM

## 2017-01-14 DIAGNOSIS — F419 Anxiety disorder, unspecified: Secondary | ICD-10-CM | POA: Diagnosis not present

## 2017-01-14 DIAGNOSIS — F4312 Post-traumatic stress disorder, chronic: Secondary | ICD-10-CM

## 2017-01-14 DIAGNOSIS — Z87891 Personal history of nicotine dependence: Secondary | ICD-10-CM

## 2017-01-14 DIAGNOSIS — F410 Panic disorder [episodic paroxysmal anxiety] without agoraphobia: Secondary | ICD-10-CM

## 2017-01-14 DIAGNOSIS — F32A Depression, unspecified: Secondary | ICD-10-CM

## 2017-01-14 MED ORDER — BUPROPION HCL ER (XL) 150 MG PO TB24: 150.0000 mg | ORAL_TABLET | ORAL | 2 refills | Status: DC

## 2017-01-14 MED ORDER — ALPRAZOLAM 0.5 MG PO TABS: 0.5000 mg | ORAL_TABLET | Freq: Every day | ORAL | 0 refills | Status: DC | PRN

## 2017-01-14 NOTE — Progress Notes (Signed)
BH MD/PA/NP OP Progress Note  01/14/2017 10:07 AM Frances Wilson  MRN:  [id number]  Chief Complaint: med check  Subjective:  Frances Wilson presents today for med management follow-up. She reports that her mood has been okay, continues to struggle with some down depressive symptoms, trouble enjoying activities. She is interested to discontinue Concerta and try Wellbutrin for ADHD and augmenting her antidepressant regimen. Spent time discussing the pharmacology of Wellbutrin, and anticipation that she should have some decrease in energy and possible fatigue with stopping Concerta. Confirms that she has no history of seizures. No safety issues, no substance abuse. She is agreeable to initiate Wellbutrin 150 XL and follow-up in 2 months.  Continues on Prozac 40 mg daily, and she reports that she uses Xanax perhaps twice a week, for anger and anxiety.     Visit Diagnosis:    ICD-9-CM ICD-10-CM   1. Panic 300.01 F41.0 ALPRAZolam (XANAX) 0.5 MG tablet  2. Chronic post-traumatic stress disorder (PTSD) 309.81 F43.12 buPROPion (WELLBUTRIN XL) 150 MG 24 hr tablet  3. Attention deficit hyperactivity disorder (ADHD), predominantly hyperactive type 314.01 F90.1   4. Anxiety and depression 300.00 F41.9 buPROPion (WELLBUTRIN XL) 150 MG 24 hr tablet   311 F32.9     Past Psychiatric History: See intake H&P for full details. Reviewed, with no updates at this time.   Past Medical History:  Past Medical History:  Diagnosis Date  . Anxiety    [facility]  . Attention deficit disorder (ADD)    onset age 5; [facility]  . Depression   . Diabetes mellitus without complication (HCC) 08/19/2009  . Hypertension     Past Surgical History:  Procedure Laterality Date  . ABDOMINAL HYSTERECTOMY     ovaries intact; no cancer.  . LEG SURGERY     L hip fracture s/p ORIF.     Family Psychiatric History: See intake H&P for full details. Reviewed, with no updates at this time.   Family History:   Family History  Problem Relation Age of Onset  . Alcohol abuse Mother   . Thyroid disease Mother   . Heart disease Sister        AMI secondary to substance abuse    Social History:  Social History   Social History  . Marital status: Divorced    Spouse name: n/a  . Number of children: 3  . Years of education: college   Occupational History  . 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 Smoking status: Former Smoker    Packs/day: 1.50    Years: 18.00    Types: Cigarettes    Start date: 08/19/1981    Quit date: 12/18/1999  . Smokeless tobacco: Never Used  . Alcohol use 0.0 oz/week     Comment: Occasional use   . Drug use: No  . Sexual activity: Yes    Partners: Male    Birth control/ protection: Surgical   Other Topics Concern  . 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      Tobacco: none      Alcohol: rare      Drugs: none; previous drug use in teenage years.  Exercise: none; joined the [employer].    Allergies:  Allergies  Allergen Reactions  . Maxipime [Cefepime] Other (See Comments)    Unknown, reports she was taken off of due to a severe reaction    Metabolic Disorder Labs: Lab Results  Component Value Date   HGBA1C 8.8 11/28/2016   No results found for: PROLACTIN Lab Results  Component Value Date   CHOL 258 (H) 10/09/2016   TRIG 190 (H) 10/09/2016   HDL 38 (L) 10/09/2016   CHOLHDL 6.8 (H) 10/09/2016   VLDL 38 (H) 10/09/2016   LDLCALC 182 (H) 10/09/2016   LDLCALC 171 (H) 11/27/2015     Current Medications: Current Outpatient Prescriptions  Medication Sig Dispense Refill  . ALPRAZolam (XANAX) 0.5 MG tablet Take 1 tablet (0.5 mg total) by mouth daily as needed for anxiety. 30 tablet 0  . aspirin  EC 81 MG tablet Take 1 tablet (81 mg total) by mouth daily. 30 tablet 0  . atorvastatin (LIPITOR) 40 MG tablet Take 1 tablet (40 mg total) by mouth daily. 90 tablet 3  . Dulaglutide 1.5 MG/0.5ML SOPN Inject 1.5 mg into the skin once a week. (Patient taking differently: Inject 0.75 mg into the skin once a week. ) 4 pen 3  . FLUoxetine (PROZAC) 40 MG capsule Take 1 capsule (40 mg total) by mouth daily. 90 capsule 1  . glipiZIDE (GLUCOTROL) 10 MG tablet Take 1 tablet (10 mg total) by mouth 2 (two) times daily before a meal. 60 tablet 11  . lisinopril-hydrochlorothiazide (ZESTORETIC) 20-12.5 MG tablet Take 1 tablet by mouth daily. 30 tablet 11  . metFORMIN (GLUCOPHAGE-XR) 500 MG 24 hr tablet Take 2 tablets (1,000 mg total) by mouth 2 (two) times daily. 120 tablet 11  . Multiple Vitamin (MULTIVITAMIN WITH MINERALS) TABS tablet Take 1 tablet by mouth daily.    . nitrofurantoin, macrocrystal-monohydrate, (MACROBID) 100 MG capsule Take 1 capsule (100 mg total) by mouth 2 (two) times daily. 10 capsule 0  . buPROPion (WELLBUTRIN XL) 150 MG 24 hr tablet Take 1 tablet (150 mg total) by mouth every morning. 30 tablet 2   No current facility-administered medications for this visit.     Neurologic: Headache: Negative Seizure: Negative Paresthesias: Negative  Musculoskeletal: Strength & Muscle Tone: within normal limits Gait & Station: normal Patient leans: N/A  Psychiatric Specialty Exam: ROS  Blood pressure 128/74, pulse 76, height 5\' 7"  (1.702 m), weight 178 lb (80.7 kg).Body mass index is 27.88 kg/m.  General Appearance: Casual and Well Groomed  Eye Contact:  Good  Speech:  Clear and Coherent  Volume:  Normal  Mood:  Euthymic  Affect:  Congruent  Thought Process:  Coherent and Goal Directed  Orientation:  Full (Time, Place, and Person)  Thought Content: Logical   Suicidal Thoughts:  No  Homicidal Thoughts:  No  Memory:  Immediate;   Good  Judgement:  Good  Insight:  Good  Psychomotor  Activity:  Normal  Concentration:  Concentration: Good  Recall:  Good  Fund of Knowledge: Good  Language: Good  Akathisia:  Negative  Handed:  Right  AIMS (if indicated):  0  Assets:  Communication Skills Desire for Improvement Financial Resources/Insurance Housing Intimacy Leisure Time Physical Health Resilience Social Support Talents/Skills Transportation  ADL's:  Intact  Cognition: WNL  Sleep:  7-9 hours    Treatment Plan Summary: Luvada Salamone is a 51 year old female with chronic PTSD and ADHD. She presents today for med management follow-up. She would like to consolidate her  medication regimen, to Wellbutrin, to hopefully manage both ADHD and depression. We will start with discontinuing Concerta and augmenting Prozac with Wellbutrin. If Wellbutrin is tolerated well, we will start to taper Prozac. No acute safety issues.  1. Panic   2. Chronic post-traumatic stress disorder (PTSD)   3. Attention deficit hyperactivity disorder (ADHD), predominantly hyperactive type   4. Anxiety and depression    Continue Prozac 40 mg daily Continue Xanax 0.5 mg once a day as needed, she is currently using approximately twice weekly Discontinue Concerta Start Wellbutrin 150 mg XL for ADHD and augmenting antidepressant Return to clinic in 2 months  Burnard LeighAlexander Arya Surena Welge, MD 01/14/2017, 10:07 AM

## 2017-02-17 ENCOUNTER — Encounter (HOSPITAL_COMMUNITY): Payer: Self-pay | Admitting: Emergency Medicine

## 2017-02-17 ENCOUNTER — Emergency Department (HOSPITAL_COMMUNITY)
Admission: EM | Admit: 2017-02-17 | Discharge: 2017-02-18 | Disposition: A | Discharge status: Home / Self Care | Payer: Managed Care, Other (non HMO) | Attending: Emergency Medicine | Admitting: Emergency Medicine

## 2017-02-17 DIAGNOSIS — I10 Essential (primary) hypertension: Secondary | ICD-10-CM | POA: Diagnosis not present

## 2017-02-17 DIAGNOSIS — R109 Unspecified abdominal pain: Secondary | ICD-10-CM | POA: Diagnosis not present

## 2017-02-17 DIAGNOSIS — Z87891 Personal history of nicotine dependence: Secondary | ICD-10-CM | POA: Insufficient documentation

## 2017-02-17 DIAGNOSIS — Z79899 Other long term (current) drug therapy: Secondary | ICD-10-CM | POA: Insufficient documentation

## 2017-02-17 DIAGNOSIS — R112 Nausea with vomiting, unspecified: Secondary | ICD-10-CM | POA: Diagnosis not present

## 2017-02-17 DIAGNOSIS — Z7982 Long term (current) use of aspirin: Secondary | ICD-10-CM | POA: Insufficient documentation

## 2017-02-17 DIAGNOSIS — E119 Type 2 diabetes mellitus without complications: Secondary | ICD-10-CM | POA: Diagnosis not present

## 2017-02-17 DIAGNOSIS — Z7984 Long term (current) use of oral hypoglycemic drugs: Secondary | ICD-10-CM | POA: Insufficient documentation

## 2017-02-17 DIAGNOSIS — R197 Diarrhea, unspecified: Secondary | ICD-10-CM | POA: Diagnosis not present

## 2017-02-17 LAB — COMPREHENSIVE METABOLIC PANEL
ALT: 37 U/L (ref 14–54)
ANION GAP: 11 (ref 5–15)
AST: 42 U/L — ABNORMAL HIGH (ref 15–41)
Albumin: 4.3 g/dL (ref 3.5–5.0)
Alkaline Phosphatase: 74 U/L (ref 38–126)
BUN: 18 mg/dL (ref 6–20)
CALCIUM: 10.3 mg/dL (ref 8.9–10.3)
CHLORIDE: 103 mmol/L (ref 101–111)
CO2: 25 mmol/L (ref 22–32)
CREATININE: 1 mg/dL (ref 0.44–1.00)
Glucose, Bld: 221 mg/dL — ABNORMAL HIGH (ref 65–99)
Potassium: 4.2 mmol/L (ref 3.5–5.1)
Sodium: 139 mmol/L (ref 135–145)
Total Bilirubin: 0.7 mg/dL (ref 0.3–1.2)
Total Protein: 7.9 g/dL (ref 6.5–8.1)

## 2017-02-17 LAB — LIPASE, BLOOD: LIPASE: 26 U/L (ref 11–51)

## 2017-02-17 LAB — CBC
HCT: 40.4 % (ref 36.0–46.0)
HEMOGLOBIN: 13.2 g/dL (ref 12.0–15.0)
MCH: 27.6 pg (ref 26.0–34.0)
MCHC: 32.7 g/dL (ref 30.0–36.0)
MCV: 84.3 fL (ref 78.0–100.0)
PLATELETS: 368 10*3/uL (ref 150–400)
RBC: 4.79 MIL/uL (ref 3.87–5.11)
RDW: 13.4 % (ref 11.5–15.5)
WBC: 15.3 10*3/uL — AB (ref 4.0–10.5)

## 2017-02-17 LAB — I-STAT BETA HCG BLOOD, ED (MC, WL, AP ONLY)

## 2017-02-17 MED ORDER — SODIUM CHLORIDE 0.9 % IV BOLUS (SEPSIS)
1000.0000 mL | Freq: Once | INTRAVENOUS | Status: AC
Start: 2017-02-18 — End: 2017-02-18
  Administered 2017-02-18: 1000 mL via INTRAVENOUS

## 2017-02-17 MED ORDER — ONDANSETRON HCL 4 MG/2ML IJ SOLN
4.0000 mg | Freq: Once | INTRAMUSCULAR | Status: AC
  Administered 2017-02-18: 4 mg via INTRAVENOUS
  Filled 2017-02-17: qty 2

## 2017-02-17 MED ORDER — ONDANSETRON 4 MG PO TBDP
ORAL_TABLET | ORAL | Status: DC
Start: 2017-02-17 — End: 2017-02-18
  Filled 2017-02-17: qty 1

## 2017-02-17 MED ORDER — MORPHINE SULFATE (PF) 4 MG/ML IV SOLN
4.0000 mg | Freq: Once | INTRAVENOUS | Status: AC
  Administered 2017-02-18: 4 mg via INTRAVENOUS
  Filled 2017-02-17: qty 1

## 2017-02-17 NOTE — ED Triage Notes (Signed)
Pt presents to ED for assessment of 1 hour of emesis, diarrhea, and lower [facility] cr[facility]

## 2017-02-17 NOTE — ED Provider Notes (Signed)
[facility]-EMERGENCY DEPT Provider Note   CSN: [id number] Arrival date & time: 02/17/17  1930     History   Chief Complaint Chief Complaint  Patient presents with  . Emesis  . Diarrhea  . Abdominal Pain    HPI Frances Wilson is a 51 y.o. female.  HPI   51 year old female with history of non-insulin-dependent diabetes, anxiety, presenting to the ED via EMS for evaluation of Nausea vomiting diarrhea. Patient states [place] [employer] [place]. She recalled eating steaks, Caesar salads, ribs, and salads and hour later when she was shopping she developed crampy abdominal pain, proceeds to vomited at least 5 times of non-bilious but did notice some trace of blood after prolonged vomiting as well as having multiple bouts of diarrhea. States she also notice some blood towards the end of her diarrhea. Abdominal cramping has been persistent and rated as moderate in severity. She cannot seems to get comfortable. She called EMS and was brought here for further evaluation. She denies any specific treatment tried. She denies fever but endorsed chills. No report of chest pain, shortness of breath, productive cough, dysuria, or rash. Has prior hysterectomy.    Past Medical History:  Diagnosis Date  . Anxiety    [facility]  . Attention deficit disorder (ADD)    onset age 51; [facility]  . Depression   . Diabetes mellitus without complication (HCC) 08/19/2009  . Hypertension     Patient Active Problem List   Diagnosis Date Noted  . Chronic post-traumatic stress disorder (PTSD) 11/14/2016  . Hyperlipidemia 01/22/2016  . Anxiety and depression 11/29/2014  . Essential hypertension, benign 11/29/2014  . Goiter 11/29/2014  . Diabetes type 2, controlled (HCC) 09/23/2014  . ADHD (attention deficit hyperactivity disorder) 09/23/2014    Past Surgical History:  Procedure Laterality Date  . ABDOMINAL HYSTERECTOMY     ovaries intact; no cancer.  . LEG SURGERY     L hip fracture s/p ORIF.     OB History    No data available       Home Medications    Prior to Admission medications   Medication Sig Start Date End Date Taking? Authorizing Provider  ALPRAZolam Prudy Feeler(XANAX) 0.5 MG tablet Take 1 tablet (0.5 mg total) by mouth daily as needed for anxiety. 01/14/17   Burnard LeighEksir, Alexander Arya, MD  aspirin EC 81 MG tablet Take 1 tablet (81 mg total) by mouth daily. 10/09/16   Arvilla MarketWallace, Catherine Lauren, DO  atorvastatin (LIPITOR) 40 MG tablet Take 1 tablet (40 mg total) by mouth daily. 10/28/16   Moses MannersHensel, William A, MD  buPROPion (WELLBUTRIN XL) 150 MG 24 hr tablet Take 1 tablet (150 mg total) by mouth every morning. 01/14/17 01/14/18  Burnard LeighEksir, Alexander Arya, MD  Dulaglutide 1.5 MG/0.5ML SOPN Inject 1.5 mg into the skin once a week. Patient taking differently: Inject 0.75 mg into the skin once a week.  10/28/16   Moses MannersHensel, William A, MD  FLUoxetine (PROZAC) 40 MG capsule Take 1 capsule (40 mg total) by mouth daily. 11/14/16 11/14/17  Burnard LeighEksir, Alexander Arya, MD  glipiZIDE (GLUCOTROL) 10 MG tablet Take 1 tablet (10 mg total) by mouth 2 (two) times daily before a meal. 09/30/16   Hensel, Santiago BumpersWilliam A, MD  lisinopril-hydrochlorothiazide (ZESTORETIC) 20-12.5 MG tablet Take 1 tablet by mouth daily. 09/30/16   Moses MannersHensel, William A, MD  metFORMIN (GLUCOPHAGE-XR) 500 MG 24 hr tablet Take 2 tablets (1,000 mg total) by mouth 2 (two) times daily. 10/28/16  Moses Manners, MD  Multiple Vitamin (MULTIVITAMIN WITH MINERALS) TABS tablet Take 1 tablet by mouth daily.    [provider]  nitrofurantoin, macrocrystal-monohydrate, (MACROBID) 100 MG capsule Take 1 capsule (100 mg total) by mouth 2 (two) times daily. 11/28/16   Araceli Bouche, DO    Family History Family History  Problem Relation Age of Onset  . Alcohol abuse Mother   . Thyroid disease Mother   . Heart disease Sister        AMI secondary to substance abuse    Social History Social History  Substance Use Topics  .  Smoking status: Former Smoker    Packs/day: 1.50    Years: 18.00    Types: Cigarettes    Start date: 08/19/1981    Quit date: 12/18/1999  . Smokeless tobacco: Never Used  . Alcohol use 0.0 oz/week     Comment: Occasional use      Allergies   Maxipime [cefepime]   Review of Systems Review of Systems  All other systems reviewed and are negative.    Physical Exam Updated Vital Signs BP 131/69   Pulse 90   Temp 99.9 F (37.7 C) (Oral)   Resp 16   SpO2 100%   Physical Exam  Constitutional: She appears well-developed and well-nourished. No distress.  HENT:  Head: Atraumatic.  Mouth/Throat: Oropharynx is clear and moist.  Eyes: Conjunctivae are normal.  Neck: Neck supple.  Cardiovascular: Normal rate and regular rhythm.   Pulmonary/Chest: Effort normal and breath sounds normal.  Abdominal: Soft. Bowel sounds are normal. She exhibits no distension. There is tenderness (Diffuse abdominal tenderness on palpation without focal point tenderness. Negative Murphy sign.).  Neurological: She is alert.  Skin: Skin is warm. No rash noted.  Psychiatric: She has a normal mood and affect.  Nursing note and vitals reviewed.    ED Treatments / Results  Labs (all labs ordered are listed, but only abnormal results are displayed) Labs Reviewed  COMPREHENSIVE METABOLIC PANEL - Abnormal; Notable for the following:       Result Value   Glucose, Bld 221 (*)    AST 42 (*)    All other components within normal limits  CBC - Abnormal; Notable for the following:    WBC 15.3 (*)    All other components within normal limits  LIPASE, BLOOD  URINALYSIS, ROUTINE W REFLEX MICROSCOPIC  I-STAT BETA HCG BLOOD, ED ([facility], [facility], [facility] ONLY)    EKG  EKG Interpretation None       Radiology No results found.  Procedures Procedures (including critical care time)  Medications Ordered in ED Medications  ondansetron (ZOFRAN-ODT) 4 MG disintegrating tablet (not administered)  sodium chloride 0.9 %  bolus 1,000 mL (0 mLs Intravenous Stopped 02/18/17 0053)  ondansetron (ZOFRAN) injection 4 mg (4 mg Intravenous Given 02/18/17 0010)  morphine 4 MG/ML injection 4 mg (4 mg Intravenous Given 02/18/17 0010)  morphine 4 MG/ML injection 4 mg (4 mg Intravenous Given 02/18/17 0229)  iopamidol (ISOVUE-300) 61 % injection (100 mLs  Contrast Given 02/18/17 0250)     Initial Impression / Assessment and Plan / ED Course  I have reviewed the triage vital signs and the nursing notes.  Pertinent labs & imaging results that were available during my care of the patient were reviewed by me and considered in my medical decision making (see chart for details).     BP (!) 103/59 (BP Location: Left Arm)   Pulse (!) 102   Temp 99.9  F (37.7 C) (Oral)   Resp 16   SpO2 98%    Final Clinical Impressions(s) / ED Diagnoses   Final diagnoses:  Nausea vomiting and diarrhea    New Prescriptions New Prescriptions   PROMETHAZINE (PHENERGAN) 25 MG TABLET    Take 1 tablet (25 mg total) by mouth every 6 (six) hours as needed for nausea.   12:02 AM Patient here with acute onset of abdominal cramping with associate nausea vomiting diarrhea. This happened shortly after she ate at [facility]. I suspect this is likely food poisoning causing her symptoms. I have low suspicion for appendicitis or colitis at this time. Will provide symptomatic treatment. No recent antibiotic use, low suspicion for C. Difficile.  1:41 AM Labs remarkable for an elevated WBC of 15.3. Her electrolytes panel are reassuring. Glucose is mildly elevated at 221. After receiving IV fluid and pain medication, patient states she feels better but still endorsed some abdominal pain. Her abdomen is soft with generalized abdominal tenderness. Provide additional pain medication and will check her by mouth status. She has not had any vomiting and diarrhea since  4:02 AM Abdominal and pelvis CT scan shows a faint fat stranding surrounding the left descending  colon which may represent a mild underlying colitis. Incidental 7 mm left lower lobe nodule with recommendations of a noncontrast chest CT in 6-12 month history recommended. Patient is a former smoker but has quit for 15 years. She is aware of the finding and will follow-up as necessary. I suspect her symptoms likely viral etiology. Appendix is fine. She is stable for discharge. I do not think antibiotics as treatment for colitis is indicated at this time however patient was sent to return if the condition worsened.   Fayrene Helper, PA-C 02/18/17 0403    Gilda Crease, MD 02/18/17 [phone]

## 2017-02-18 ENCOUNTER — Emergency Department (HOSPITAL_COMMUNITY): Payer: Managed Care, Other (non HMO)

## 2017-02-18 MED ORDER — IOPAMIDOL (ISOVUE-300) INJECTION 61%
INTRAVENOUS | Status: AC
  Administered 2017-02-18: 100 mL
  Filled 2017-02-18: qty 100

## 2017-02-18 MED ORDER — MORPHINE SULFATE (PF) 4 MG/ML IV SOLN
4.0000 mg | Freq: Once | INTRAVENOUS | Status: AC
  Administered 2017-02-18: 4 mg via INTRAVENOUS
  Filled 2017-02-18: qty 1

## 2017-02-18 MED ORDER — PROMETHAZINE HCL 25 MG PO TABS: 25.0000 mg | ORAL_TABLET | Freq: Four times a day (QID) | ORAL | 0 refills | Status: DC | PRN

## 2017-02-18 NOTE — ED Notes (Signed)
Patient transported to CT 

## 2017-02-19 ENCOUNTER — Emergency Department (HOSPITAL_COMMUNITY)
Admission: EM | Admit: 2017-02-19 | Discharge: 2017-02-19 | Disposition: A | Discharge status: Home / Self Care | Payer: Managed Care, Other (non HMO) | Attending: Emergency Medicine | Admitting: Emergency Medicine

## 2017-02-19 ENCOUNTER — Encounter (HOSPITAL_COMMUNITY): Payer: Self-pay | Admitting: Emergency Medicine

## 2017-02-19 DIAGNOSIS — K529 Noninfective gastroenteritis and colitis, unspecified: Secondary | ICD-10-CM | POA: Diagnosis not present

## 2017-02-19 DIAGNOSIS — F909 Attention-deficit hyperactivity disorder, unspecified type: Secondary | ICD-10-CM | POA: Insufficient documentation

## 2017-02-19 DIAGNOSIS — F419 Anxiety disorder, unspecified: Secondary | ICD-10-CM | POA: Diagnosis not present

## 2017-02-19 DIAGNOSIS — I10 Essential (primary) hypertension: Secondary | ICD-10-CM | POA: Diagnosis not present

## 2017-02-19 DIAGNOSIS — Z87891 Personal history of nicotine dependence: Secondary | ICD-10-CM | POA: Insufficient documentation

## 2017-02-19 DIAGNOSIS — E785 Hyperlipidemia, unspecified: Secondary | ICD-10-CM | POA: Diagnosis not present

## 2017-02-19 DIAGNOSIS — K625 Hemorrhage of anus and rectum: Secondary | ICD-10-CM | POA: Diagnosis present

## 2017-02-19 DIAGNOSIS — E119 Type 2 diabetes mellitus without complications: Secondary | ICD-10-CM | POA: Diagnosis not present

## 2017-02-19 DIAGNOSIS — Z7984 Long term (current) use of oral hypoglycemic drugs: Secondary | ICD-10-CM | POA: Diagnosis not present

## 2017-02-19 DIAGNOSIS — Z79899 Other long term (current) drug therapy: Secondary | ICD-10-CM | POA: Insufficient documentation

## 2017-02-19 LAB — URINALYSIS, ROUTINE W REFLEX MICROSCOPIC
Bilirubin Urine: NEGATIVE
GLUCOSE, UA: 150 mg/dL — AB
Ketones, ur: NEGATIVE mg/dL
LEUKOCYTES UA: NEGATIVE
NITRITE: NEGATIVE
PROTEIN: NEGATIVE mg/dL
SPECIFIC GRAVITY, URINE: 1.028 (ref 1.005–1.030)
pH: 5 (ref 5.0–8.0)

## 2017-02-19 LAB — COMPREHENSIVE METABOLIC PANEL
ALBUMIN: 3.7 g/dL (ref 3.5–5.0)
ALK PHOS: 64 U/L (ref 38–126)
ALT: 22 U/L (ref 14–54)
AST: 19 U/L (ref 15–41)
Anion gap: 7 (ref 5–15)
BILIRUBIN TOTAL: 0.6 mg/dL (ref 0.3–1.2)
BUN: 11 mg/dL (ref 6–20)
CALCIUM: 9.5 mg/dL (ref 8.9–10.3)
CO2: 27 mmol/L (ref 22–32)
CREATININE: 0.94 mg/dL (ref 0.44–1.00)
Chloride: 101 mmol/L (ref 101–111)
GFR calc Af Amer: >60 mL/min (ref >60)
GLUCOSE: 170 mg/dL — AB (ref 65–99)
POTASSIUM: 3.5 mmol/L (ref 3.5–5.1)
Sodium: 135 mmol/L (ref 135–145)
TOTAL PROTEIN: 7 g/dL (ref 6.5–8.1)

## 2017-02-19 LAB — CBC WITH DIFFERENTIAL/PLATELET
BASOS ABS: 0 10*3/uL (ref 0.0–0.1)
BASOS PCT: 0 %
Eosinophils Absolute: 0.3 10*3/uL (ref 0.0–0.7)
Eosinophils Relative: 2 %
HCT: 35.6 % — ABNORMAL LOW (ref 36.0–46.0)
HEMOGLOBIN: 11.3 g/dL — AB (ref 12.0–15.0)
LYMPHS PCT: 31 %
Lymphs Abs: 3.9 10*3/uL (ref 0.7–4.0)
MCH: 27.3 pg (ref 26.0–34.0)
MCHC: 31.7 g/dL (ref 30.0–36.0)
MCV: 86 fL (ref 78.0–100.0)
MONO ABS: 0.6 10*3/uL (ref 0.1–1.0)
MONOS PCT: 5 %
NEUTROS ABS: 7.9 10*3/uL — AB (ref 1.7–7.7)
NEUTROS PCT: 62 %
PLATELETS: 280 10*3/uL (ref 150–400)
RBC: 4.14 MIL/uL (ref 3.87–5.11)
RDW: 13.4 % (ref 11.5–15.5)
WBC: 12.7 10*3/uL — ABNORMAL HIGH (ref 4.0–10.5)

## 2017-02-19 LAB — CBC
HCT: 39.4 % (ref 36.0–46.0)
Hemoglobin: 12.7 g/dL (ref 12.0–15.0)
MCH: 27.4 pg (ref 26.0–34.0)
MCHC: 32.2 g/dL (ref 30.0–36.0)
MCV: 85.1 fL (ref 78.0–100.0)
PLATELETS: 325 10*3/uL (ref 150–400)
RBC: 4.63 MIL/uL (ref 3.87–5.11)
RDW: 13.5 % (ref 11.5–15.5)
WBC: 13.8 10*3/uL — AB (ref 4.0–10.5)

## 2017-02-19 LAB — LIPASE, BLOOD: Lipase: 30 U/L (ref 11–51)

## 2017-02-19 LAB — POC OCCULT BLOOD, ED: Fecal Occult Bld: POSITIVE — AB

## 2017-02-19 MED ORDER — SODIUM CHLORIDE 0.9 % IV BOLUS (SEPSIS)
1000.0000 mL | Freq: Once | INTRAVENOUS | Status: AC
  Administered 2017-02-19: 1000 mL via INTRAVENOUS

## 2017-02-19 MED ORDER — METRONIDAZOLE 500 MG PO TABS: 500.0000 mg | ORAL_TABLET | Freq: Two times a day (BID) | ORAL | 0 refills | Status: DC

## 2017-02-19 MED ORDER — AMOXICILLIN-POT CLAVULANATE 875-125 MG PO TABS: 1.0000 | ORAL_TABLET | Freq: Two times a day (BID) | ORAL | 0 refills | Status: AC

## 2017-02-19 MED ORDER — CIPROFLOXACIN HCL 500 MG PO TABS: 500.0000 mg | ORAL_TABLET | Freq: Two times a day (BID) | ORAL | 0 refills | Status: DC

## 2017-02-19 NOTE — ED Notes (Signed)
Patient able to ambulate independently  

## 2017-02-19 NOTE — ED Provider Notes (Signed)
Please see Dr. Lanetta InchFloyd's note for prior care. Briefly, this is a 51yo female who presented 7/2 with nausea, vomiting and diarrhea with CT showing likely colitis, who presented today with bright red blood per rectum. Dr. Adela LankFloyd had been discharging pt with colitis, however nursing approached me prior to discharge with patient noted to have blood pressure in the 90s.  Evaluated patient. She reports her diarrhea had resolved today but she had 3 episodes of BRBPR.  Initially was small amount, however last 2 were larger in amount, showed photo of large blood clot in the toilet bowl. Despite no diarrhea, symptoms may still be colitis versus internal hemmorhoids, however other lower GI bleed also on differential given more significant amount of bleeding observed in photo.  Hypotension may be secondary to dehydration in setting of recent n/v/diarrhea, pt not eating today. Discussed that we will continue to monitor and repeat labs given low blood pressures noted over the last hour.  Patient given 2L of NS.  Mild decrease in hgb likely hemodilution.  Patient without any additional episodes now 9 hours after most recent event and after receiving hydration remains hemodynamically stable.  Hgb with mild decrease however no significant drop. Feel she is stable for discharge with strict return precautions. Discussed that if she has significant bleeding to return to ED.Recommend GI follow up. Patient discharged in stable condition with understanding of reasons to return.    Frances Wilson, Frances Pha, MD 02/20/17 [phone]

## 2017-02-19 NOTE — ED Triage Notes (Signed)
Pt sts noted bright red stool today; pt seen here 2 days ago for food poisoning but bloody stool started today; pt sts some lower abd pain

## 2017-02-19 NOTE — ED Provider Notes (Signed)
[facility] DEPT Provider Note   CSN: [id number] Arrival date & time: 02/19/17  1346     History   Chief Complaint Chief Complaint  Patient presents with  . Rectal Bleeding    HPI Frances Wilson is a 51 y.o. female.  51 yo F with n/v/d.  Going on for past couple of days.  Now with BRBPR.  Scant output.  Limited fever last night.  Recently seen two days ago with CT scan with colitis.  No noted worsening pain.  Able to tolerate PO at home.    The history is provided by the patient.  Rectal Bleeding  Quality:  Bright red Amount:  Moderate Duration:  1 day Timing:  Constant Chronicity:  New Context: defecation   Similar prior episodes: no   Relieved by:  Nothing Worsened by:  Nothing Ineffective treatments:  None tried Associated symptoms: no dizziness, no fever and no vomiting     Past Medical History:  Diagnosis Date  . Anxiety    [facility]  . Attention deficit disorder (ADD)    onset age 30; [facility]  . Depression   . Diabetes mellitus without complication (HCC) 08/19/2009  . Hypertension     Patient Active Problem List   Diagnosis Date Noted  . Chronic post-traumatic stress disorder (PTSD) 11/14/2016  . Hyperlipidemia 01/22/2016  . Anxiety and depression 11/29/2014  . Essential hypertension, benign 11/29/2014  . Goiter 11/29/2014  . Diabetes type 2, controlled (HCC) 09/23/2014  . ADHD (attention deficit hyperactivity disorder) 09/23/2014    Past Surgical History:  Procedure Laterality Date  . ABDOMINAL HYSTERECTOMY     ovaries intact; no cancer.  . LEG SURGERY     L hip fracture s/p ORIF.     OB History    No data available       Home Medications    Prior to Admission medications   Medication Sig Start Date End Date Taking? Authorizing Provider  ALPRAZolam Prudy Feeler) 0.5 MG tablet Take 1 tablet (0.5 mg total) by mouth daily as needed for anxiety. 01/14/17   Burnard Leigh, MD  amoxicillin-clavulanate (AUGMENTIN) 875-125  MG tablet Take 1 tablet by mouth 2 (two) times daily. 02/19/17 02/26/17  Melene Plan, DO  aspirin EC 81 MG tablet Take 1 tablet (81 mg total) by mouth daily. 10/09/16   Arvilla Market, DO  atorvastatin (LIPITOR) 40 MG tablet Take 1 tablet (40 mg total) by mouth daily. 10/28/16   Moses Manners, MD  buPROPion (WELLBUTRIN XL) 150 MG 24 hr tablet Take 1 tablet (150 mg total) by mouth every morning. 01/14/17 01/14/18  Burnard Leigh, MD  Dulaglutide 1.5 MG/0.5ML SOPN Inject 1.5 mg into the skin once a week. Patient taking differently: Inject 0.75 mg into the skin once a week.  10/28/16   Moses Manners, MD  FLUoxetine (PROZAC) 40 MG capsule Take 1 capsule (40 mg total) by mouth daily. Patient not taking: Reported on 02/17/2017 11/14/16 11/14/17  Burnard Leigh, MD  glipiZIDE (GLUCOTROL) 10 MG tablet Take 1 tablet (10 mg total) by mouth 2 (two) times daily before a meal. 09/30/16   Hensel, Santiago Bumpers, MD  lisinopril-hydrochlorothiazide (ZESTORETIC) 20-12.5 MG tablet Take 1 tablet by mouth daily. 09/30/16   Moses Manners, MD  metFORMIN (GLUCOPHAGE-XR) 500 MG 24 hr tablet Take 2 tablets (1,000 mg total) by mouth 2 (two) times daily. Patient taking differently: Take 500 mg by mouth 2 (two) times daily.  10/28/16   Moses Manners,  MD  Multiple Vitamin (MULTIVITAMIN WITH MINERALS) TABS tablet Take 1 tablet by mouth daily.    [provider]  nitrofurantoin, macrocrystal-monohydrate, (MACROBID) 100 MG capsule Take 1 capsule (100 mg total) by mouth 2 (two) times daily. Patient not taking: Reported on 02/17/2017 11/28/16   Araceli Bouche, DO  promethazine (PHENERGAN) 25 MG tablet Take 1 tablet (25 mg total) by mouth every 6 (six) hours as needed for nausea. 02/18/17   Fayrene Helper, PA-C    Family History Family History  Problem Relation Age of Onset  . Alcohol abuse Mother   . Thyroid disease Mother   . Heart disease Sister        AMI secondary to substance abuse    Social  History Social History  Substance Use Topics  . Smoking status: Former Smoker    Packs/day: 1.50    Years: 18.00    Types: Cigarettes    Start date: 08/19/1981    Quit date: 12/18/1999  . Smokeless tobacco: Never Used  . Alcohol use 0.0 oz/week     Comment: Occasional use      Allergies   Maxipime [cefepime]   Review of Systems Review of Systems  Constitutional: Negative for chills and fever.  HENT: Negative for congestion and rhinorrhea.   Eyes: Negative for redness and visual disturbance.  Respiratory: Negative for shortness of breath and wheezing.   Cardiovascular: Negative for chest pain and palpitations.  Gastrointestinal: Positive for hematochezia. Negative for nausea and vomiting.  Genitourinary: Negative for dysuria and urgency.  Musculoskeletal: Negative for arthralgias and myalgias.  Skin: Negative for pallor and wound.  Neurological: Negative for dizziness and headaches.     Physical Exam Updated Vital Signs BP 100/72   Pulse 87   Temp 98.9 F (37.2 C) (Oral)   Resp 16   Ht 5' 6.5" (1.689 m)   Wt 78.9 kg (174 lb)   SpO2 95%   BMI 27.66 kg/m   Physical Exam  Constitutional: She is oriented to person, place, and time. She appears well-developed and well-nourished. No distress.  HENT:  Head: Normocephalic and atraumatic.  Eyes: EOM are normal. Pupils are equal, round, and reactive to light.  Neck: Normal range of motion. Neck supple.  Cardiovascular: Normal rate and regular rhythm.  Exam reveals no gallop and no friction rub.   No murmur heard. Pulmonary/Chest: Effort normal. She has no wheezes. She has no rales.  Abdominal: Soft. She exhibits no distension and no mass. There is no tenderness. There is no guarding.  Genitourinary:  Genitourinary Comments: Small external hernia without bleeding.  BRB on rectal exam.   Musculoskeletal: She exhibits no edema or tenderness.  Neurological: She is alert and oriented to person, place, and time.  Skin: Skin is  warm and dry. She is not diaphoretic.  Psychiatric: She has a normal mood and affect. Her behavior is normal.  Nursing note and vitals reviewed.    ED Treatments / Results  Labs (all labs ordered are listed, but only abnormal results are displayed) Labs Reviewed  COMPREHENSIVE METABOLIC PANEL - Abnormal; Notable for the following:       Result Value   Glucose, Bld 170 (*)    All other components within normal limits  CBC - Abnormal; Notable for the following:    WBC 13.8 (*)    All other components within normal limits  URINALYSIS, ROUTINE W REFLEX MICROSCOPIC - Abnormal; Notable for the following:    Color, Urine AMBER (*)  APPearance HAZY (*)    Glucose, UA 150 (*)    Hgb urine dipstick SMALL (*)    Bacteria, UA RARE (*)    Squamous Epithelial / LPF 6-30 (*)    All other components within normal limits  POC OCCULT BLOOD, ED - Abnormal; Notable for the following:    Fecal Occult Bld POSITIVE (*)    All other components within normal limits  LIPASE, BLOOD    EKG  EKG Interpretation None       Radiology Ct Abdomen Pelvis W Contrast  Result Date: 02/18/2017 CLINICAL DATA:  51 y/o F; lower abdominal pain with nausea, vomiting, and diarrhea. EXAM: CT ABDOMEN AND PELVIS WITH CONTRAST TECHNIQUE: Multidetector CT imaging of the abdomen and pelvis was performed using the standard protocol following bolus administration of intravenous contrast. CONTRAST:  ISOVUE-300 IOPAMIDOL (ISOVUE-300) INJECTION 61% COMPARISON:  None. FINDINGS: Lower chest: 7 mm left lower lobe subpleural nodule (series 5, image 20). Small hiatal hernia. Hepatobiliary: No focal liver abnormality is seen. No gallstones, gallbladder wall thickening, or biliary dilatation. Pancreas: Unremarkable. No pancreatic ductal dilatation or surrounding inflammatory changes. Spleen: Normal in size without focal abnormality. Adrenals/Urinary Tract: Adrenal glands are unremarkable. Kidneys are normal, without renal calculi,  focal lesion, or hydronephrosis. Bladder is unremarkable. Stomach/Bowel: Stomach is within normal limits. Appendix appears normal. No appreciable bowel wall thickening, distention, or inflammatory changes. Vascular/Lymphatic: Aortic atherosclerosis with mild calcification. No enlarged abdominal or pelvic lymph nodes. Reproductive: Status post hysterectomy. No adnexal masses. Other: No abdominal wall hernia or abnormality. There is faint fat stranding surrounding the left descending colon and thickening of the left pericolic fascia (series 3, image 34). Musculoskeletal: Left proximal femur fixation hardware partially visualized. IMPRESSION: 1. Faint fat stranding surrounding the left descending colon and thickening of the left pericolic fascia may represent a mild underlying colitis. 2. 7 mm left lower lobe subpleural nodule. Non-contrast chest CT at 6-12 months is recommended. If the nodule is stable at time of repeat CT, then future CT at 18-24 months (from today's scan) is considered optional for low-risk patients, but is recommended for high-risk patients. This recommendation follows the consensus statement: Guidelines for Management of Incidental Pulmonary Nodules Detected on CT Images: From the Fleischner Society 2017; Radiology 2017; 284:228-243. 3. Otherwise unremarkable CT of the abdomen and pelvis. Electronically Signed   By: Mitzi Hansen M.D.   On: 02/18/2017 03:13    Procedures Procedures (including critical care time)  Medications Ordered in ED Medications - No data to display   Initial Impression / Assessment and Plan / ED Course  I have reviewed the triage vital signs and the nursing notes.  Pertinent labs & imaging results that were available during my care of the patient were reviewed by me and considered in my medical decision making (see chart for details).     51 yo F with a cc of BRBPR.  Going on since this morning.  Scant output.  No lightheadedness, weakness.  Hbg  at baseline. Benign abdominal exam.  Started on abx.  GI follow up.   3:12 PM:  I have discussed the diagnosis/risks/treatment options with the patient and family and believe the pt to be eligible for discharge home to follow-up with PCP. We also discussed returning to the ED immediately if new or worsening sx occur. We discussed the sx which are most concerning (e.g., sudden worsening pain, fever, inability to tolerate by mouth) that necessitate immediate return. Medications administered to the patient during their visit  and any new prescriptions provided to the patient are listed below.  Medications given during this visit Medications - No data to display   The patient appears reasonably screen and/or stabilized for discharge and I doubt any other medical condition or other [facility] requiring further screening, evaluation, or treatment in the ED at this time prior to discharge.    Final Clinical Impressions(s) / ED Diagnoses   Final diagnoses:  Colitis    New Prescriptions New Prescriptions   AMOXICILLIN-CLAVULANATE (AUGMENTIN) 875-125 MG TABLET    Take 1 tablet by mouth 2 (two) times daily.     Melene PlanFloyd, Jamair Cato, DO 02/19/17 1512

## 2017-02-19 NOTE — Discharge Instructions (Addendum)
Follow up with GI for possible colonoscopy.  BRAT diet

## 2017-02-19 NOTE — ED Notes (Signed)
ED Provider at bedside. 

## 2017-03-17 ENCOUNTER — Encounter (HOSPITAL_COMMUNITY): Payer: Self-pay | Admitting: Psychiatry

## 2017-03-17 ENCOUNTER — Telehealth: Payer: Self-pay | Admitting: Internal Medicine

## 2017-03-17 ENCOUNTER — Ambulatory Visit (INDEPENDENT_AMBULATORY_CARE_PROVIDER_SITE_OTHER): Payer: Managed Care, Other (non HMO) | Admitting: Psychiatry

## 2017-03-17 DIAGNOSIS — F329 Major depressive disorder, single episode, unspecified: Secondary | ICD-10-CM

## 2017-03-17 DIAGNOSIS — Z87891 Personal history of nicotine dependence: Secondary | ICD-10-CM

## 2017-03-17 DIAGNOSIS — F419 Anxiety disorder, unspecified: Secondary | ICD-10-CM | POA: Diagnosis not present

## 2017-03-17 DIAGNOSIS — F410 Panic disorder [episodic paroxysmal anxiety] without agoraphobia: Secondary | ICD-10-CM | POA: Diagnosis not present

## 2017-03-17 DIAGNOSIS — Z811 Family history of alcohol abuse and dependence: Secondary | ICD-10-CM

## 2017-03-17 DIAGNOSIS — F4312 Post-traumatic stress disorder, chronic: Secondary | ICD-10-CM

## 2017-03-17 DIAGNOSIS — F32A Depression, unspecified: Secondary | ICD-10-CM

## 2017-03-17 MED ORDER — BUPROPION HCL ER (XL) 150 MG PO TB24: 150.0000 mg | ORAL_TABLET | ORAL | 1 refills | Status: DC

## 2017-03-17 MED ORDER — FLUOXETINE HCL 40 MG PO CAPS: 40.0000 mg | ORAL_CAPSULE | Freq: Every day | ORAL | 1 refills | Status: DC

## 2017-03-17 MED ORDER — ALPRAZOLAM 0.5 MG PO TABS: 0.5000 mg | ORAL_TABLET | Freq: Every day | ORAL | 0 refills | Status: DC | PRN

## 2017-03-17 NOTE — Progress Notes (Signed)
BH MD/PA/NP OP Progress Note  03/17/2017 10:27 AM Frances Wilson  MRN:  [id number]  Chief Complaint:  med management follow-up  Subjective:  Frances Wilson presents today for med management follow-up for depression and PTSD. She reports that she is doing well on Wellbutrin 150 mg XL. She no longer takes Concerta, and continues to use Xanax quite sparingly. Notices that she is more anxious over the past couple months, and attributes some of this to being bored during the summer.  After some discussion, it came to light that the patient had stopped Prozac 40 mg daily. She reports that she thought she was supposed to discontinue Prozac and be only on Wellbutrin. Spent time clarifying that Wellbutrin and Prozac are often taken together and can be synergistic. She reports that this makes sense, because when she was on Prozac and Wellbutrin together she was feeling really good in terms of anxiety and mood.  Spent some time discussing some of her stressors related to her daughters mental health difficulties, and their issues with irritability, relying on mom to vent much of their issues. Spent time discussing some limit setting strategies. We also spent time discussing self-care, and the patient agrees that she needs to get back into the gym, and she plans to join the [facility]. She also plans to join a bowling league, to help with her sense of activity and socialization. She reports that she and her boyfriend that she was dating casually recently broke up, so she is interested to get back into the dating arena.  She denies any acute safety issues and agrees to follow-up in 3 months.  She will be following up with her prior therapist, Delice Bison, at the [facility].    Visit Diagnosis:    ICD-10-CM   1. Anxiety and depression F41.9 FLUoxetine (PROZAC) 40 MG capsule   F32.9 buPROPion (WELLBUTRIN XL) 150 MG 24 hr tablet  2. Chronic post-traumatic stress disorder (PTSD) F43.12 buPROPion (WELLBUTRIN XL) 150 MG 24 hr  tablet  3. Panic F41.0 ALPRAZolam (XANAX) 0.5 MG tablet    Past Psychiatric History: See intake H&P for full details. Reviewed, with no updates at this time.   Past Medical History:  Past Medical History:  Diagnosis Date  . Anxiety    [facility]  . Attention deficit disorder (ADD)    onset age 36; [facility]  . Depression   . Diabetes mellitus without complication (HCC) 08/19/2009  . Hypertension     Past Surgical History:  Procedure Laterality Date  . ABDOMINAL HYSTERECTOMY     ovaries intact; no cancer.  . LEG SURGERY     L hip fracture s/p ORIF.     Family Psychiatric History: See intake H&P for full details. Reviewed, with no updates at this time.   Family History:  Family History  Problem Relation Age of Onset  . Alcohol abuse Mother   . Thyroid disease Mother   . Heart disease Sister        AMI secondary to substance abuse    Social History:  Social History   Social History  . Marital status: Divorced    Spouse name: n/a  . Number of children: 3  . Years of education: college   Occupational History  . 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 Smoking status: Former Smoker    Packs/day: 1.50    Years: 18.00    Types: Cigarettes    Start date: 08/19/1981    Quit date: 12/18/1999  .  Smokeless tobacco: Never Used  . Alcohol use 0.0 oz/week     Comment: Occasional use   . Drug use: No  . Sexual activity: Yes    Partners: Male    Birth control/ protection: Surgical   Other Topics Concern  . 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      Tobacco: none      Alcohol: rare      Drugs: none; previous drug use in teenage years.      Exercise: none; joined the [employer].    Allergies:   Allergies  Allergen Reactions  . Maxipime [Cefepime] Other (See Comments)    Unknown, reports she was taken off of due to a severe reaction    Metabolic Disorder Labs: Lab Results  Component Value Date   HGBA1C 8.8 11/28/2016   No results found for: PROLACTIN Lab Results  Component Value Date   CHOL 258 (H) 10/09/2016   TRIG 190 (H) 10/09/2016   HDL 38 (L) 10/09/2016   CHOLHDL 6.8 (H) 10/09/2016   VLDL 38 (H) 10/09/2016   LDLCALC 182 (H) 10/09/2016   LDLCALC 171 (H) 11/27/2015     Current Medications: Current Outpatient Prescriptions  Medication Sig Dispense Refill  . ALPRAZolam (XANAX) 0.5 MG tablet Take 1 tablet (0.5 mg total) by mouth daily as needed for anxiety. 30 tablet 0  . aspirin EC 81 MG tablet Take 1 tablet (81 mg total) by mouth daily. 30 tablet 0  . atorvastatin (LIPITOR) 40 MG tablet Take 1 tablet (40 mg total) by mouth daily. 90 tablet 3  . buPROPion (WELLBUTRIN XL) 150 MG 24 hr tablet Take 1 tablet (150 mg total) by mouth every morning. 90 tablet 1  . Dulaglutide 1.5 MG/0.5ML SOPN Inject 1.5 mg into the skin once a week. (Patient taking differently: Inject 0.75 mg into the skin once a week. ) 4 pen 3  . FLUoxetine (PROZAC) 40 MG capsule Take 1 capsule (40 mg total) by mouth daily. 90 capsule 1  . glipiZIDE (GLUCOTROL) 10 MG tablet Take 1 tablet (10 mg total) by mouth 2 (two) times daily before a meal. 60 tablet 11  . lisinopril-hydrochlorothiazide (ZESTORETIC) 20-12.5 MG tablet Take 1 tablet by mouth daily. 30 tablet 11  . metFORMIN (GLUCOPHAGE-XR) 500 MG 24 hr tablet Take 2 tablets (1,000 mg total) by mouth 2 (two) times daily. (Patient taking differently: Take 500 mg by mouth 2 (two) times daily. ) 120 tablet 11  . Multiple Vitamin (MULTIVITAMIN WITH MINERALS) TABS tablet Take 1 tablet by mouth daily.    . nitrofurantoin, macrocrystal-monohydrate, (MACROBID) 100 MG capsule Take 1 capsule (100 mg total) by mouth 2 (two) times daily. 10 capsule 0  . promethazine  (PHENERGAN) 25 MG tablet Take 1 tablet (25 mg total) by mouth every 6 (six) hours as needed for nausea. 20 tablet 0   No current facility-administered medications for this visit.     Neurologic: Headache: Negative Seizure: Negative Paresthesias: Negative  Musculoskeletal: Strength & Muscle Tone: within normal limits Gait & Station: normal Patient leans: N/A  Psychiatric Specialty Exam: ROS  Blood pressure 122/74, pulse 92, height 5\' 7"  (1.702 m), weight 179 lb 6.4 oz (81.4 kg).Body  mass index is 28.1 kg/m.  General Appearance: Casual and Fairly Groomed  Eye Contact:  Good  Speech:  Clear and Coherent  Volume:  Normal  Mood:  Euthymic  Affect:  Appropriate and Congruent  Thought Process:  Goal Directed  Orientation:  Full (Time, Place, and Person)  Thought Content: Logical   Suicidal Thoughts:  No  Homicidal Thoughts:  No  Memory:  Immediate;   Good  Judgement:  Good  Insight:  Good  Psychomotor Activity:  Normal  Concentration:  Concentration: Good  Recall:  Good  Fund of Knowledge: Good  Language: Good  Akathisia:  Negative  Handed:  Right  AIMS (if indicated):  0  Assets:  Communication Skills Desire for Improvement Financial Resources/Insurance Housing Leisure Time Physical Health Resilience Social Support Talents/Skills Transportation Vocational/Educational  ADL's:  Intact  Cognition: WNL  Sleep:  7-8 hours    Treatment Plan Summary: Frances Wilson is a 51 year old female with a history of PTSD, ADHD, and episodes of panic. She presents today for medication management follow-up. She was doing fairly well on a combination of Wellbutrin 150 mg and Prozac 40 mg, but discontinued Prozac due to confusion about whether or not she was supposed to continue Prozac. She agrees to restart Prozac at 40 mg daily in conjunction with Wellbutrin. She uses Xanax sparingly for frustrations, generally related to interpersonal interactions with her family. She denies any  acute safety issues and agrees to follow-up in 3 months. She will be returning to her work for the fall at the [employer], and reports that she tends to do better when she has a structured day.  1. Anxiety and depression   2. Chronic post-traumatic stress disorder (PTSD)   3. Panic    Prozac 40 mg restarted Continue Wellbutrin 150 mg XL daily Xanax 0.5 mg tablet when necessary for anxiety or panic; 30 days provided, this should last 3 months Follow-up in 3 months  Burnard LeighAlexander Arya Eksir, MD 03/17/2017, 10:27 AM

## 2017-03-17 NOTE — Telephone Encounter (Signed)
Clinical info completed on DMV form.  Place form in Dr. Philis PiqueWallace's box for completion.  Frances Wilson, [name], [place]

## 2017-03-17 NOTE — Telephone Encounter (Signed)
DMV handicapped placard  form dropped off for at front desk for completion.  Verified that patient section of form has been completed.  Last DOS/WCC with PCP was *11-28-16.  Placed form in team folder to be completed by clinical staff.  Frances Wilson   Pt has an appt 03-20-17 and can pick up the form then

## 2017-03-18 ENCOUNTER — Other Ambulatory Visit: Payer: Self-pay | Admitting: Family Medicine

## 2017-03-18 DIAGNOSIS — E119 Type 2 diabetes mellitus without complications: Secondary | ICD-10-CM

## 2017-03-19 NOTE — Telephone Encounter (Signed)
Reviewed, completed, and signed form.  Note routed to RN team inbasket and placed completed form in Clinic RN's office (wall pocket above desk).  Catherine L Wallace, DO  

## 2017-03-19 NOTE — Telephone Encounter (Signed)
Patient informed that placard form is complete and ready for pick up.  Telesa Jeancharles L, RN  

## 2017-03-20 ENCOUNTER — Ambulatory Visit (INDEPENDENT_AMBULATORY_CARE_PROVIDER_SITE_OTHER): Payer: Managed Care, Other (non HMO) | Admitting: Pharmacist

## 2017-03-20 ENCOUNTER — Encounter: Payer: Self-pay | Admitting: Pharmacist

## 2017-03-20 DIAGNOSIS — E119 Type 2 diabetes mellitus without complications: Secondary | ICD-10-CM | POA: Diagnosis not present

## 2017-03-20 MED ORDER — DAPAGLIFLOZIN PROPANEDIOL 5 MG PO TABS: 5.0000 mg | ORAL_TABLET | Freq: Every day | ORAL | 3 refills | Status: DC

## 2017-03-20 NOTE — Patient Instructions (Addendum)
Thanks for coming to see us today!   1. We are starting a medication called Farxiga 5 mg by mouth once a day  2. Keep taking your other medications  3. Test your blood sugar at least once every other day in the morning before you eat anything  4. Bring your meter and blood sugar log with you to your next appointment    Come back to see us in ~ 4 weeks.

## 2017-03-20 NOTE — Assessment & Plan Note (Signed)
Diabetes longstanding currently uncontrolled. Patient denies hypoglycemic events and is able to verbalize appropriate hypoglycemia management plan. Patient reports adherence with medication. Control is suboptimal due to dietary noncompliance, recent illness, and sedentary lifestyle.  -Started Farxiga (dapagliflozin) 5 mg po daily. -Continued all other medications -Consider d/c of glipizide at next visit given duration of therapy and low likelihood that the medication is still contributing to glycemic control. -Pt was d

## 2017-03-20 NOTE — Progress Notes (Signed)
S:     Chief Complaint  Patient presents with  . Medication Management    Diabetes     Patient arrives in good spirits, ambulating without assistance.  Presents for diabetes evaluation, education, and management at the request of Dr. Earlene PlaterWallace. Patient was referred on 09/07/2016 and is here today as a follow up pharmacy visit.  Patient was last seen by Primary Care Provider on 11/28/2016.   Patient states that she has been doing well since discharge from recent hospitalization. N/v has resolved. Has not had colonoscopy yet. Reports that her diet was very CHO-rich while hospitalized and she subsequently has had continued dietary noncompliance.   Patient reports that she has had near syncopal episodes while at work when she was taking metformin 1000 BID. The school nurse told her to say "200 is my new low" and not to take more than metformin 500 BID anymore at that time. Takes Trulicity on Fridays.   Has not been testing blood sugars, ran out of strips. Doesn't know that her BG has improved but states that she feels better since starting Trulicity. Had a UTI x 1 this year (completed treatment) but had not had one prior in several years.   Patient reports diabetes was diagnosed ~12 years ago. Has been on glipizide for >8 years. She expresses desire to decrease the number of medications she is on and does want any additional injectable therapy (does not want insulin).  Patient reports adherence with medications.  Current diabetes medications include: glipizide 10 mg BID, metformin XR 500 mg BID, Trulicity 1.5 mg weekly Current hypertension medications include: lisinopril-HCTZ 20-12.5 mg  Patient denies hypoglycemic events.  Patient reported dietary habits: Eats 3 meals/day Breakfast: bacon egg and cheese biscuit from [employer], coffee with sugar substitute and creamer Lunch: yogurt, whopper Montez HagemanJr Dinner: Spinach salad, baked chicken, vinaigrette  Snacks: fruit (grapes, apples, bananas, cheese  crackers, graham crackers and PB) Drinks: flavored waters (no sugar)  Patient reported exercise habits: no exercise   Patient reports nocturia.  Patient denies neuropathy. Patient denies visual changes. Patient reports self foot exams.   Last eye exam within last year Has been > 6 months since she has seen the dentist.    O:  Physical Exam  Constitutional: She appears well-developed and well-nourished.  Vitals reviewed.    Review of Systems  All other systems reviewed and are negative.    Lab Results  Component Value Date   HGBA1C 8.8 11/28/2016   Vitals:   03/20/17 1602  BP: 98/66    2 hour post-prandial/random CBG: low 200s, excursion to low 300s  10 year ASCVD risk: 6.7%.  A/P: Diabetes longstanding currently uncontrolled. Patient denies hypoglycemic events and is able to verbalize appropriate hypoglycemia management plan. Patient reports adherence with medication. Control is suboptimal due to dietary noncompliance, recent illness, and sedentary lifestyle.  -Started Farxiga (dapagliflozin) 5 mg po daily. -Continued all other medications -Consider d/c of glipizide at next visit given duration of therapy and low likelihood that the medication is still contributing to glycemic control. -Pt was due for A1C this visit but politely refused as she was sure that it would still be high given dietary indiscretion. Next A1C anticipated next visit.    ASCVD risk greater than 7.5%. Continued Aspirin *81 mg and Continued atorvastatin 40 mg.    Written patient instructions provided.  Total time in face to face counseling 35 minutes.   Follow up in Pharmacist Clinic Visit 4 weeks.   Patient seen  with Al CorpusLindsey Foltanski, PharmD PGY-1 Resident, and Devota Pacearoline Welles, PharmD PGY-2 Resident.

## 2017-03-24 NOTE — Progress Notes (Signed)
Patient ID: Frances Wilson, female   DOB: 03/02/1966, 50 y.o.   MRN: [id number] Reviewed: Agree with Dr. Koval's documentation and management. 

## 2017-04-24 ENCOUNTER — Encounter: Payer: Self-pay | Admitting: Pharmacist

## 2017-04-24 ENCOUNTER — Ambulatory Visit (INDEPENDENT_AMBULATORY_CARE_PROVIDER_SITE_OTHER): Payer: Managed Care, Other (non HMO) | Admitting: Pharmacist

## 2017-04-24 DIAGNOSIS — E119 Type 2 diabetes mellitus without complications: Secondary | ICD-10-CM

## 2017-04-24 LAB — POCT GLYCOSYLATED HEMOGLOBIN (HGB A1C): Hemoglobin A1C: 8.5

## 2017-04-24 MED ORDER — DAPAGLIFLOZIN PROPANEDIOL 10 MG PO TABS: 10.0000 mg | ORAL_TABLET | Freq: Every day | ORAL | 3 refills | Status: DC

## 2017-04-24 NOTE — Assessment & Plan Note (Signed)
Diabetes longstanding currently uncontrolled, however improved given A1C today of 8.5 and CBG report. Patient denies hypoglycemic events and is able to verbalize appropriate hypoglycemia management plan. Patient reports adherence with medication. Control is suboptimal due to unoptimized medication regimen, sedentary lifestyle, insulin resistance. -Increased Farxiga to 10 mg daily -Continued all other medications -Consider d/c glipizide if experiencing hypoglycemia at next visit

## 2017-04-24 NOTE — Progress Notes (Signed)
    S:     Chief Complaint  Patient presents with  . Medication Management    Diabetes    Patient arrives in good spirits, ambulating without assistance.  Presents for diabetes evaluation, education, and management at the request of Dr. Earlene PlaterWallace. Patient was referred on 09/07/2016 and is here today as a follow up pharmacy visit.  Patient was last seen by Primary Care Provider on 11/28/2016. Last seen in pharmacy clinic on 03/20/2017. Started Farxiga at that time.  Reports that she has been doing well since last visit. Tolerating Farxiga and Trulicity well. Denies s/sx of UTI or yeast infection. Has been testing her blood sugar three times weekly since last visit.   Patient reports adherence with medications.  Current diabetes medications include: Trulicity 1.5 mg weekly, farxiga 5 mg daily, glipizide 10 mg BID, metformin XR 500 mg BID Current hypertension medications include: lisinopril-HCTZ 20-12.5 mg daily  Patient denies hypoglycemic events. States that she felt shaky once between 80-100 mg/dL, occurred midday.   Patient reported dietary habits: Eats 3 meals/day Breakfast: Eggs, sausage, potato bowl Lunch: chicken and green beans, fruit/cheese bowl Dinner: snacks on fruit  Patient reported exercise habits: none, limited by money per patient.  Goal weight = 165-170 lbs   Patient reports nocturia. Once/night, stable Patient reports neuropathy. Numbness in R big toe every morning, improves with movement.  Patient denies visual changes. Patient reports self foot exams. Denies issues.   O:  Physical Exam  Constitutional: She appears well-developed and well-nourished.  Vitals reviewed.   Review of Systems  All other systems reviewed and are negative.   Lab Results  Component Value Date   HGBA1C 8.5 04/24/2017   Vitals:   04/24/17 1622  BP: 135/85  Pulse: 90   Home fasting CBG: upper 100s - low 200s  A/P: Diabetes longstanding currently uncontrolled, however improved  given A1C today of 8.5 and CBG report. Patient denies hypoglycemic events and is able to verbalize appropriate hypoglycemia management plan. Patient reports adherence with medication. Control is suboptimal due to unoptimized medication regimen, sedentary lifestyle, insulin resistance. -Increased Farxiga to 10 mg daily -Continued all other medications -Consider d/c glipizide if experiencing hypoglycemia at next visit  Next A1C anticipated December, 2018.  ASCVD risk greater than 7.5%. Continued Aspirin 81 mg and Continued atorvastatin 40 mg.   Written patient instructions provided.  Total time in face to face counseling 25 minutes.   Follow up in Pharmacist Clinic Visit 1 month.   Patient seen with Devota Pacearoline Welles, PharmD PGY-2 Resident.

## 2017-04-24 NOTE — Patient Instructions (Addendum)
Thanks for coming to see us today.   FANTASTIC JOB checking your blood sugar and taking your medications. Your A1C is lower today at 8.5. Our next goal is to get your A1C below 8.   1. Increase your Farxiga to 10 mg daily. You can get two of the 5 mg tablets until you run out. The new prescription was sent to your pharmacy.   2. Continue all of your other medications for now.  3. Find a work out buddy and work on exercise until the next time we see you. Walking is a great way to get exercise. Work towards your goal of weighing 165-170 lbs.   Come back to see us in ~ 1 month.

## 2017-04-24 NOTE — Progress Notes (Signed)
Patient ID: Frances Wilson, female   DOB: 1966-04-23, 51 y.o.   MRN: [id number] Reviewed: Agree with Dr. Macky LowerKoval's documentation and management.

## 2017-06-17 ENCOUNTER — Ambulatory Visit (INDEPENDENT_AMBULATORY_CARE_PROVIDER_SITE_OTHER): Payer: 59 | Admitting: Psychiatry

## 2017-06-17 DIAGNOSIS — F332 Major depressive disorder, recurrent severe without psychotic features: Secondary | ICD-10-CM | POA: Diagnosis not present

## 2017-06-17 DIAGNOSIS — Z87891 Personal history of nicotine dependence: Secondary | ICD-10-CM

## 2017-06-17 DIAGNOSIS — F410 Panic disorder [episodic paroxysmal anxiety] without agoraphobia: Secondary | ICD-10-CM | POA: Diagnosis not present

## 2017-06-17 MED ORDER — VENLAFAXINE HCL ER 75 MG PO CP24: 150.0000 mg | ORAL_CAPSULE | Freq: Every day | ORAL | 1 refills | Status: DC

## 2017-06-17 MED ORDER — ALPRAZOLAM 0.5 MG PO TABS: 0.5000 mg | ORAL_TABLET | Freq: Every day | ORAL | 0 refills | Status: DC | PRN

## 2017-06-17 MED ORDER — TRAZODONE HCL 50 MG PO TABS: 50.0000 mg | ORAL_TABLET | Freq: Every day | ORAL | 1 refills | Status: DC

## 2017-06-17 NOTE — Progress Notes (Signed)
BH MD/PA/NP OP Progress Note  06/17/2017 11:03 AM Frances Wilson  MRN:  [id number]  Chief Complaint: Depression has been worse HPI: Frances Wilson reports that her depression has worsened over the past 2 months, and she thought it might just get better on its own.  She reports that she is taking Prozac and Wellbutrin.  She reports that she is tearful, avoidant of tasks, tends to be less energetic and somewhat withdrawn.  She reports that she ruminates in the evening and has trouble sleeping, and then ends up needing to take naps during the day to catch up on her rest.  She has not had any thoughts or plans to hurt herself.  Spent time discussing a switch from her current regimen to venlafaxine for a more potent antidepressant intervention, and initiating trazodone for sleep.  Visit Diagnosis:    ICD-10-CM   1. Severe episode of recurrent major depressive disorder, without psychotic features (HCC) F33.2 venlafaxine XR (EFFEXOR XR) 75 MG 24 hr capsule  2. Panic F41.0 ALPRAZolam (XANAX) 0.5 MG tablet    Past Psychiatric History: See intake H&P for full details. Reviewed, with no updates at this time.   Past Medical History:  Past Medical History:  Diagnosis Date  . Anxiety    [facility]  . Attention deficit disorder (ADD)    onset age 24; [facility]  . Depression   . Diabetes mellitus without complication (HCC) 08/19/2009  . Hypertension     Past Surgical History:  Procedure Laterality Date  . ABDOMINAL HYSTERECTOMY     ovaries intact; no cancer.  . LEG SURGERY     L hip fracture s/p ORIF.     Family Psychiatric History: See intake H&P for full details. Reviewed, with no updates at this time.   Family History:  Family History  Problem Relation Age of Onset  . Alcohol abuse Mother   . Thyroid disease Mother   . Heart disease Sister        AMI secondary to substance abuse    Social History:  Social History   Social History  . Marital status: Divorced     Spouse name: n/a  . Number of children: 3  . Years of education: college   Occupational History  . 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 Smoking status: Former Smoker    Packs/day: 1.50    Years: 18.00    Types: Cigarettes    Start date: 08/19/1981    Quit date: 12/18/1999  . Smokeless tobacco: Never Used  . Alcohol use 0.0 oz/week     Comment: Occasional use   . Drug use: No  . Sexual activity: Yes    Partners: Male    Birth control/ protection: Surgical   Other Topics Concern  . 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      Tobacco: none      Alcohol: rare      Drugs: none; previous drug use in teenage years.      Exercise: none; joined the [employer].    Allergies:  Allergies  Allergen Reactions  . Maxipime [Cefepime] Other (See Comments)    Unknown, reports  she was taken off of due to a severe reaction    Metabolic Disorder Labs: Lab Results  Component Value Date   HGBA1C 8.5 04/24/2017   No results found for: PROLACTIN Lab Results  Component Value Date   CHOL 258 (H) 10/09/2016   TRIG 190 (H) 10/09/2016   HDL 38 (L) 10/09/2016   CHOLHDL 6.8 (H) 10/09/2016   VLDL 38 (H) 10/09/2016   LDLCALC 182 (H) 10/09/2016   LDLCALC 171 (H) 11/27/2015   Lab Results  Component Value Date   TSH 0.45 01/22/2016   TSH 0.30 (L) 11/27/2015    Therapeutic Level Labs: No results found for: LITHIUM No results found for: VALPROATE No components found for:  CBMZ  Current Medications: Current Outpatient Prescriptions  Medication Sig Dispense Refill  . ALPRAZolam (XANAX) 0.5 MG tablet Take 1 tablet (0.5 mg total) by mouth daily as needed for anxiety. 30 tablet 0  . aspirin EC 81 MG tablet Take 1 tablet (81 mg total)  by mouth daily. 30 tablet 0  . atorvastatin (LIPITOR) 40 MG tablet Take 1 tablet (40 mg total) by mouth daily. 90 tablet 3  . dapagliflozin propanediol (FARXIGA) 10 MG TABS tablet Take 10 mg by mouth daily. 30 tablet 3  . glipiZIDE (GLUCOTROL) 10 MG tablet Take 1 tablet (10 mg total) by mouth 2 (two) times daily before a meal. 60 tablet 11  . lisinopril-hydrochlorothiazide (ZESTORETIC) 20-12.5 MG tablet Take 1 tablet by mouth daily. 30 tablet 11  . metFORMIN (GLUCOPHAGE-XR) 500 MG 24 hr tablet Take 2 tablets (1,000 mg total) by mouth 2 (two) times daily. (Patient taking differently: Take 500 mg by mouth 2 (two) times daily. ) 120 tablet 11  . Multiple Vitamin (MULTIVITAMIN WITH MINERALS) TABS tablet Take 1 tablet by mouth daily.    . traZODone (DESYREL) 50 MG tablet Take 1 tablet (50 mg total) by mouth at bedtime. Take 1-2 tablets at bedtime 90 tablet 1  . TRULICITY 1.5 MG/0.5ML SOPN INJECT 1 SYRINGE SUBCUTANEOUSLY ONCE WEEKLY 4 pen 3  . venlafaxine XR (EFFEXOR XR) 75 MG 24 hr capsule Take 2 capsules (150 mg total) by mouth daily. 180 capsule 1   No current facility-administered medications for this visit.      Musculoskeletal: Strength & Muscle Tone: within normal limits Gait & Station: normal Patient leans: N/A  Psychiatric Specialty Exam: ROS  There were no vitals taken for this visit.There is no height or weight on file to calculate BMI.  General Appearance: Casual and Fairly Groomed  Eye Contact:  Fair  Speech:  Clear and Coherent  Volume:  Normal  Mood:  Anxious and Depressed  Affect:  Appropriate and Congruent  Thought Process:  Goal Directed and Descriptions of Associations: Intact  Orientation:  Full (Time, Place, and Person)  Thought Content: Logical   Suicidal Thoughts:  No  Homicidal Thoughts:  No  Memory:  Immediate;   Fair  Judgement:  Fair  Insight:  Fair  Psychomotor Activity:  Normal  Concentration:  Concentration: Fair  Recall:  Good  Fund of Knowledge: Good   Language: Good  Akathisia:  Negative  Handed:  Right  AIMS (if indicated): not done  Assets:  Communication Skills Desire for Improvement Financial Resources/Insurance Housing Vocational/Educational  ADL's:  Intact  Cognition: WNL  Sleep:  Fair   Screenings: PHQ2-9     [facility] Visit from 11/27/2015  in [facility] at [facility] Visit from 10/09/2015 in [facility] Care at [facility] Visit from 11/28/2014 in Primary Care at [facility] Total Score  0  0  6  0  0  PHQ-9 Total Score  -  0  13  -  -       Assessment and Plan:  Frances Wilson is a 51 year old female with severe major depressive disorder with a recent worsening of her mood symptoms.  She would benefit from a switch to a higher potency antidepressant, and we will follow-up in 10-12 weeks or sooner if needed.  She does not have any acute safety issues.  There does not appear to be any external stressors contributing to this worsening of her mood symptoms.  1. Severe episode of recurrent major depressive disorder, without psychotic features (HCC)   2. Panic     Status of current problems: gradually worsening  Labs Ordered: No orders of the defined types were placed in this encounter.   Labs Reviewed: na  Collateral Obtained/Records Reviewed: N/A  Plan:  Discontinue Prozac and Wellbutrin Initiate Effexor 75 mg XR, increase to 150 mg in 2 weeks Initiate trazodone 50-100 mg nightly for sleep  I spent 30 minutes with the patient in direct face-to-face clinical care.  Greater than 50% of this time was spent in counseling and coordination of care with the patient.    Burnard Leigh, MD 06/17/2017, 11:03 AM

## 2017-07-18 ENCOUNTER — Other Ambulatory Visit: Payer: Self-pay | Admitting: Internal Medicine

## 2017-07-18 DIAGNOSIS — E119 Type 2 diabetes mellitus without complications: Secondary | ICD-10-CM

## 2017-09-05 ENCOUNTER — Telehealth (HOSPITAL_COMMUNITY): Payer: Self-pay

## 2017-09-05 NOTE — Telephone Encounter (Signed)
Patient is calling because she wants to know if she can please go back on an ADHD medication. Please review and advise, thank you

## 2017-09-08 NOTE — Telephone Encounter (Signed)
We can certainly talk about it at her follow up

## 2017-09-09 ENCOUNTER — Ambulatory Visit (HOSPITAL_COMMUNITY): Payer: Self-pay | Admitting: Psychiatry

## 2017-09-10 NOTE — Telephone Encounter (Signed)
I talked to patient and she was agreeable to that plan and will call back to make appointment

## 2017-10-25 ENCOUNTER — Other Ambulatory Visit: Payer: Self-pay | Admitting: Family Medicine

## 2017-10-25 DIAGNOSIS — E119 Type 2 diabetes mellitus without complications: Secondary | ICD-10-CM

## 2017-11-30 ENCOUNTER — Other Ambulatory Visit: Payer: Self-pay | Admitting: Family Medicine

## 2017-11-30 DIAGNOSIS — E119 Type 2 diabetes mellitus without complications: Secondary | ICD-10-CM

## 2017-12-03 ENCOUNTER — Other Ambulatory Visit: Payer: Self-pay

## 2017-12-03 DIAGNOSIS — E119 Type 2 diabetes mellitus without complications: Secondary | ICD-10-CM

## 2017-12-04 MED ORDER — DULAGLUTIDE 1.5 MG/0.5ML ~~LOC~~ SOAJ: SUBCUTANEOUS | 3 refills | Status: DC

## 2017-12-09 ENCOUNTER — Encounter (HOSPITAL_COMMUNITY): Payer: Self-pay | Admitting: Psychiatry

## 2017-12-09 ENCOUNTER — Ambulatory Visit (INDEPENDENT_AMBULATORY_CARE_PROVIDER_SITE_OTHER): Payer: 59 | Admitting: Psychiatry

## 2017-12-09 DIAGNOSIS — Z634 Disappearance and death of family member: Secondary | ICD-10-CM | POA: Diagnosis not present

## 2017-12-09 DIAGNOSIS — F410 Panic disorder [episodic paroxysmal anxiety] without agoraphobia: Secondary | ICD-10-CM

## 2017-12-09 DIAGNOSIS — Z87891 Personal history of nicotine dependence: Secondary | ICD-10-CM

## 2017-12-09 DIAGNOSIS — F332 Major depressive disorder, recurrent severe without psychotic features: Secondary | ICD-10-CM | POA: Diagnosis not present

## 2017-12-09 DIAGNOSIS — Z79899 Other long term (current) drug therapy: Secondary | ICD-10-CM | POA: Diagnosis not present

## 2017-12-09 DIAGNOSIS — Z811 Family history of alcohol abuse and dependence: Secondary | ICD-10-CM

## 2017-12-09 MED ORDER — ALPRAZOLAM 0.5 MG PO TABS: 0.5000 mg | ORAL_TABLET | Freq: Every day | ORAL | 0 refills | Status: DC | PRN

## 2017-12-09 MED ORDER — VENLAFAXINE HCL ER 150 MG PO CP24: 300.0000 mg | ORAL_CAPSULE | Freq: Every day | ORAL | 1 refills | Status: DC

## 2017-12-09 MED ORDER — TRAZODONE HCL 50 MG PO TABS: 50.0000 mg | ORAL_TABLET | Freq: Every day | ORAL | 1 refills | Status: DC

## 2017-12-09 NOTE — Progress Notes (Signed)
BH MD/PA/NP OP Progress Note  12/09/2017 1:40 PM Frances CharsBrenda Topham  MRN:  [id number]  Chief Complaint:  all over the place HPI: Frances Wilson presents for medication management follow-up.  Last seen approximately 6 months ago.  I noticed that she self restarted her Wellbutrin and got a refill of this at the pharmacy.  I instructed her against self directed medication changes, she doesn't feel the wellbutrin has helped at all.  She reports that she has been doing okay in her mood until recently, her mom passed away.  She has been struggling with anxiety and some positive stressors contributing to feeling disorganized and a bit chaotic in terms of her thinking.  She has noticed some increased frustrations and irritability.  She was interested in considering ADHD medication, I suggested we increase Effexor to 300 mg, given that this can also help target inattentive symptoms.  She was agreeable to this, and does feel that she would benefit from increased antidepressant.  No acute safety issues, she has been responsible and using Xanax only on a very rare as-needed basis, I will provide a 30-day prescription for her to use over the next 3-6 months.  Visit Diagnosis:    ICD-10-CM   1. Severe episode of recurrent major depressive disorder, without psychotic features (HCC) F33.2 venlafaxine XR (EFFEXOR-XR) 150 MG 24 hr capsule    traZODone (DESYREL) 50 MG tablet  2. Panic F41.0 ALPRAZolam (XANAX) 0.5 MG tablet    Past Psychiatric History: See intake H&P for full details. Reviewed, with no updates at this time.   Past Medical History:  Past Medical History:  Diagnosis Date  . Anxiety    [facility]  . Attention deficit disorder (ADD)    onset age 52; [facility]  . Depression   . Diabetes mellitus without complication (HCC) 08/19/2009  . Hypertension     Past Surgical History:  Procedure Laterality Date  . ABDOMINAL HYSTERECTOMY     ovaries intact; no cancer.  . LEG SURGERY     L  hip fracture s/p ORIF.     Family Psychiatric History: See intake H&P for full details. Reviewed, with no updates at this time.   Family History:  Family History  Problem Relation Age of Onset  . Alcohol abuse Mother   . Thyroid disease Mother   . Heart disease Sister        AMI secondary to substance abuse    Social History:  Social History   Socioeconomic History  . Marital status: Divorced    Spouse name: n/a  . Number of children: 3  . Years of education: college  . Highest education level: Not on file  Occupational History  . 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 Financial resource strain: Not on file  . Food insecurity:    Worry: Not on file    Inability: Not on file  . Transportation needs:    Medical: Not on file    Non-medical: Not on file  Tobacco Use  . Smoking status: Former Smoker    Packs/day: 1.50    Years: 18.00    Pack years: 27.00    Types: Cigarettes    Start date: 08/19/1981    Last attempt to quit: 12/18/1999    Years since quitting: 17.9  . Smokeless tobacco: Never Used  Substance and Sexual Activity  . Alcohol use: Yes    Alcohol/week: 0.0 oz    Comment: Occasional use   . Drug use: No  .  Sexual activity: Yes    Partners: Male    Birth control/protection: Surgical  Lifestyle  . Physical activity:    Days per week: Not on file    Minutes per session: Not on file  . Stress: Not on file  Relationships  . Social connections:    Talks on phone: Not on file    Gets together: Not on file    Attends religious service: Not on file    Active member of club or organization: Not on file    Attends meetings of clubs or organizations: Not on file    Relationship status: Not on file  Other Topics Concern  . 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      Tobacco: none      Alcohol: rare      Drugs: none; previous drug use in teenage years.      Exercise: none; joined the [employer].    Allergies:  Allergies  Allergen Reactions  . Maxipime [Cefepime] Other (See Comments)    Unknown, reports she was taken off of due to a severe reaction    Metabolic Disorder Labs: Lab Results  Component Value Date   HGBA1C 8.5 04/24/2017   No results found for: PROLACTIN Lab Results  Component Value Date   CHOL 258 (H) 10/09/2016   TRIG 190 (H) 10/09/2016   HDL 38 (L) 10/09/2016   CHOLHDL 6.8 (H) 10/09/2016   VLDL 38 (H) 10/09/2016   LDLCALC 182 (H) 10/09/2016   LDLCALC 171 (H) 11/27/2015   Lab Results  Component Value Date   TSH 0.45 01/22/2016   TSH 0.30 (L) 11/27/2015    Therapeutic Level Labs: No results found for: LITHIUM No results found for: VALPROATE No components found for:  CBMZ  Current Medications: Current Outpatient Medications  Medication Sig Dispense Refill  . ALPRAZolam (XANAX) 0.5 MG tablet Take 1 tablet (0.5 mg total) by mouth daily as needed for anxiety. 30 tablet 0  . aspirin EC 81 MG tablet Take 1 tablet (81 mg total) by mouth daily. 30 tablet 0  . atorvastatin (LIPITOR) 40 MG tablet Take 1 tablet (40 mg total) by mouth daily. 90 tablet 3  . Dulaglutide (TRULICITY) 1.5 MG/0.5ML SOPN INJECT CONTENTS OF 1 SYRINGE SUBCUTANEOUSLY ONCE A WEEK 4 pen 3  . FARXIGA 10 MG TABS tablet TAKE 1 TABLET BY MOUTH ONCE DAILY 30 tablet 3  . metFORMIN (GLUCOPHAGE-XR) 500 MG 24 hr tablet Take 1 tablet (500 mg total) by mouth 2 (two) times daily. 180 tablet 3  . Multiple Vitamin (MULTIVITAMIN WITH MINERALS) TABS tablet Take 1 tablet by mouth daily.    . traZODone (DESYREL) 50 MG tablet Take 1 tablet (50 mg total) by mouth at bedtime. Take 1-2 tablets at bedtime 90 tablet 1  . venlafaxine XR (EFFEXOR-XR) 150 MG 24 hr capsule Take 2 capsules (300 mg total) by mouth daily. 180  capsule 1  . glipiZIDE (GLUCOTROL) 10 MG tablet Take 1 tablet (10 mg total) by mouth 2 (two) times daily before a meal. (Patient not taking: Reported on 12/09/2017) 60 tablet 11  . lisinopril-hydrochlorothiazide (ZESTORETIC) 20-12.5 MG tablet Take 1 tablet by mouth daily. (Patient not  taking: Reported on 12/09/2017) 30 tablet 11   No current facility-administered medications for this visit.      Musculoskeletal: Strength & Muscle Tone: within normal limits Gait & Station: normal Patient leans: N/A  Psychiatric Specialty Exam: ROS  Blood pressure 132/80, pulse 97, height 5' 6.5" (1.689 m), weight 172 lb (78 kg).Body mass index is 27.35 kg/m.  General Appearance: Casual and Fairly Groomed  Eye Contact:  Good  Speech:  Clear and Coherent and Normal Rate  Volume:  Normal  Mood:  Anxious  Affect:  Congruent  Thought Process:  Goal Directed and Descriptions of Associations: Intact  Orientation:  Full (Time, Place, and Person)  Thought Content: Logical   Suicidal Thoughts:  No  Homicidal Thoughts:  No  Memory:  Immediate;   Fair  Judgement:  Fair  Insight:  Fair  Psychomotor Activity:  Restlessness  Concentration:  Concentration: Fair  Recall:  Fair  Fund of Knowledge: Good  Language: Good  Akathisia:  Negative  Handed:  Right  AIMS (if indicated): not done  Assets:  Communication Skills Desire for Improvement Financial Resources/Insurance Housing Social Support Transportation Vocational/Educational  ADL's:  Intact  Cognition: WNL  Sleep:  Good   Screenings: PHQ2-9     Office Visit from 10/09/2016 in [facility] Office Visit from 08/28/2016 in [facility] Office Visit from 11/27/2015 in Primary Care at [facility] Visit from 10/09/2015 in Primary Care at [facility] Visit from 11/28/2014 in Primary Care at [facility]  PHQ-2 Total Score  0  0  6  0  0  PHQ-9 Total Score  -  0  13  -  -       Assessment and Plan:  Megyn Leng  presents for medication management follow-up, and complains of inattentive symptoms and low energy, difficulty with organization, poor frustration tolerance, rumination.  She is also having a flare in depressive symptoms in the context of multiple positive and negative stressors.  We agreed to increase Effexor as below and continue trazodone for sleep.  She has self adjusted her medicine, recently restarting Wellbutrin.  I educated her against this, and particularly given that Wellbutrin has not provided her with any improvement or benefit.  She uses Xanax sparingly.  We will continue as below and follow-up in 2-3 months.  1. Severe episode of recurrent major depressive disorder, without psychotic features (HCC)   2. Panic     Status of current problems: gradually worsening  Labs Ordered: No orders of the defined types were placed in this encounter.   Labs Reviewed: n/a  Collateral Obtained/Records Reviewed: nccsd  Plan:  Increase Effexor to 300 mg XR daily Continue trazodone 50-100 mg nightly Consider straterra for adhd symptoms if needed rtc 3 months  I spent 20 minutes with the patient in direct face-to-face clinical care.  Greater than 50% of this time was spent in counseling and coordination of care with the patient.    Burnard Leigh, MD 12/09/2017, 1:40 PM

## 2017-12-24 ENCOUNTER — Encounter (HOSPITAL_COMMUNITY): Payer: Self-pay | Admitting: Psychiatry

## 2018-01-27 ENCOUNTER — Encounter (HOSPITAL_COMMUNITY): Payer: Self-pay | Admitting: Psychiatry

## 2018-01-27 ENCOUNTER — Ambulatory Visit (HOSPITAL_COMMUNITY): Payer: 59 | Admitting: Psychiatry

## 2018-01-27 VITALS — BP 131/80 | HR 83 | Ht 66.0 in | Wt 172.0 lb

## 2018-01-27 DIAGNOSIS — Z79899 Other long term (current) drug therapy: Secondary | ICD-10-CM

## 2018-01-27 DIAGNOSIS — F901 Attention-deficit hyperactivity disorder, predominantly hyperactive type: Secondary | ICD-10-CM

## 2018-01-27 DIAGNOSIS — Z87891 Personal history of nicotine dependence: Secondary | ICD-10-CM | POA: Diagnosis not present

## 2018-01-27 DIAGNOSIS — F4312 Post-traumatic stress disorder, chronic: Secondary | ICD-10-CM | POA: Diagnosis not present

## 2018-01-27 DIAGNOSIS — Z811 Family history of alcohol abuse and dependence: Secondary | ICD-10-CM | POA: Diagnosis not present

## 2018-01-27 DIAGNOSIS — F332 Major depressive disorder, recurrent severe without psychotic features: Secondary | ICD-10-CM | POA: Diagnosis not present

## 2018-01-27 MED ORDER — METHYLPHENIDATE HCL ER (OSM) 36 MG PO TBCR: 36.0000 mg | EXTENDED_RELEASE_TABLET | Freq: Every day | ORAL | 0 refills | Status: DC

## 2018-01-27 MED ORDER — METHYLPHENIDATE HCL ER (OSM) 36 MG PO TBCR: 36.0000 mg | EXTENDED_RELEASE_TABLET | ORAL | 0 refills | Status: DC

## 2018-01-27 MED ORDER — VENLAFAXINE HCL ER 150 MG PO CP24: 150.0000 mg | ORAL_CAPSULE | Freq: Every day | ORAL | 0 refills | Status: DC

## 2018-01-27 NOTE — Progress Notes (Signed)
BH MD/PA/NP OP Progress Note  01/27/2018 9:07 AM Frances Wilson  MRN:  [id number]  Chief Complaint: mood okay, adhd same HPI: Frances Wilson reports that her mood and anxiety are generally stable particularly given a onslaught of recent family members passing away and family tragedies.  She is coping with this relatively well.  She continues with ADHD symptoms, predominantly the inattention and difficulty with procrastination and follow-through on tasks.  She has a baseline hyperactivity and impulsive and humorous way of interacting.  She admits that she took a dose of a stimulant from a friend, the same dose that she had previously been on, Concerta 36 mg.  I cautioned patient against doing this for safety and for legal reason.  We agreed to restart Concerta 36 mg and follow-up in 8 weeks.  She had previously been on this and we had hoped to reduce her overall medication burden by using Wellbutrin and/or Effexor to target both mood and ADHD symptoms, but it has been inadequate.  She has not used Xanax once in the past few months.  Disclosed to patient that this Clinical research associatewriter is leaving this practice at the end of August 2019, and patients always has the right to choose their provider. Reassured patient that office will work to provide smooth transition of care whether they wish to remain at this office, or to continue with this provider, or seek alternative care options in community.  They expressed understanding.   Visit Diagnosis:    ICD-10-CM   1. Attention deficit hyperactivity disorder (ADHD), predominantly hyperactive type F90.1   2. Severe episode of recurrent major depressive disorder, without psychotic features (HCC) F33.2 venlafaxine XR (EFFEXOR-XR) 150 MG 24 hr capsule    methylphenidate (CONCERTA) 36 MG PO CR tablet    methylphenidate (CONCERTA) 36 MG PO CR tablet    methylphenidate (CONCERTA) 36 MG PO CR tablet  3. Chronic post-traumatic stress disorder (PTSD) F43.12     Past  Psychiatric History: See intake H&P for full details. Reviewed, with no updates at this time.   Past Medical History:  Past Medical History:  Diagnosis Date  . Anxiety    [facility]  . Attention deficit disorder (ADD)    onset age 52; [facility]  . Depression   . Diabetes mellitus without complication (HCC) 08/19/2009  . Hypertension     Past Surgical History:  Procedure Laterality Date  . ABDOMINAL HYSTERECTOMY     ovaries intact; no cancer.  . LEG SURGERY     L hip fracture s/p ORIF.     Family Psychiatric History: See intake H&P for full details. Reviewed, with no updates at this time.   Family History:  Family History  Problem Relation Age of Onset  . Alcohol abuse Mother   . Thyroid disease Mother   . Heart disease Sister        AMI secondary to substance abuse    Social History:  Social History   Socioeconomic History  . Marital status: Divorced    Spouse name: n/a  . Number of children: 3  . Years of education: college  . Highest education level: Not on file  Occupational History  . 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 Financial resource strain: Not on file  . Food insecurity:    Worry: Not on file    Inability: Not on file  . Transportation needs:    Medical: Not on file    Non-medical: Not on file  Tobacco Use  .  Smoking status: Former Smoker    Packs/day: 1.50    Years: 18.00    Pack years: 27.00    Types: Cigarettes    Start date: 08/19/1981    Last attempt to quit: 12/18/1999    Years since quitting: 18.1  . Smokeless tobacco: Never Used  Substance and Sexual Activity  . Alcohol use: Yes    Alcohol/week: 0.0 oz    Comment: Occasional use   . Drug use: No  . Sexual activity: Yes    Partners: Male    Birth control/protection: Surgical  Lifestyle  . Physical activity:    Days per week: Not on file    Minutes per session: Not on file  . Stress: Not on file  Relationships  . Social connections:    Talks on phone:  Not on file    Gets together: Not on file    Attends religious service: Not on file    Active member of club or organization: Not on file    Attends meetings of clubs or organizations: Not on file    Relationship status: Not on file  Other Topics Concern  . 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      Tobacco: none      Alcohol: rare      Drugs: none; previous drug use in teenage years.      Exercise: none; joined the [employer].    Allergies:  Allergies  Allergen Reactions  . Maxipime [Cefepime] Other (See Comments)    Unknown, reports she was taken off of due to a severe reaction    Metabolic Disorder Labs: Lab Results  Component Value Date   HGBA1C 8.5 04/24/2017   No results found for: PROLACTIN Lab Results  Component Value Date   CHOL 258 (H) 10/09/2016   TRIG 190 (H) 10/09/2016   HDL 38 (L) 10/09/2016   CHOLHDL 6.8 (H) 10/09/2016   VLDL 38 (H) 10/09/2016   LDLCALC 182 (H) 10/09/2016   LDLCALC 171 (H) 11/27/2015   Lab Results  Component Value Date   TSH 0.45 01/22/2016   TSH 0.30 (L) 11/27/2015    Therapeutic Level Labs: No results found for: LITHIUM No results found for: VALPROATE No components found for:  CBMZ  Current Medications: Current Outpatient Medications  Medication Sig Dispense Refill  . ALPRAZolam (XANAX) 0.5 MG tablet Take 1 tablet (0.5 mg total) by mouth daily as needed for anxiety. 30 tablet 0  . aspirin EC 81 MG tablet Take 1 tablet (81 mg total) by mouth daily. 30 tablet 0  . Dulaglutide (TRULICITY) 1.5 MG/0.5ML SOPN INJECT CONTENTS OF 1 SYRINGE SUBCUTANEOUSLY ONCE A WEEK 4 pen 3  . FARXIGA 10 MG TABS tablet TAKE 1 TABLET BY MOUTH ONCE DAILY 30 tablet 3  . metFORMIN (GLUCOPHAGE-XR) 500 MG 24 hr  tablet Take 1 tablet (500 mg total) by mouth 2 (two) times daily. 180 tablet 3  . Multiple Vitamin (MULTIVITAMIN WITH MINERALS) TABS tablet Take 1 tablet by mouth daily.    . traZODone (DESYREL) 50 MG tablet Take 1 tablet (50 mg total) by mouth at bedtime. Take 1-2 tablets at bedtime 90 tablet 1  . venlafaxine XR (EFFEXOR-XR)  150 MG 24 hr capsule Take 1 capsule (150 mg total) by mouth daily. 90 capsule 0  . atorvastatin (LIPITOR) 40 MG tablet Take 1 tablet (40 mg total) by mouth daily. (Patient not taking: Reported on 01/27/2018) 90 tablet 3  . glipiZIDE (GLUCOTROL) 10 MG tablet Take 1 tablet (10 mg total) by mouth 2 (two) times daily before a meal. (Patient not taking: Reported on 12/09/2017) 60 tablet 11  . lisinopril-hydrochlorothiazide (ZESTORETIC) 20-12.5 MG tablet Take 1 tablet by mouth daily. (Patient not taking: Reported on 12/09/2017) 30 tablet 11  . methylphenidate (CONCERTA) 36 MG PO CR tablet Take 1 tablet (36 mg total) by mouth every morning. 30 tablet 0  . [START ON 02/27/2018] methylphenidate (CONCERTA) 36 MG PO CR tablet Take 1 tablet (36 mg total) by mouth daily before breakfast. 30 tablet 0  . [START ON 03/30/2018] methylphenidate (CONCERTA) 36 MG PO CR tablet Take 1 tablet (36 mg total) by mouth daily before breakfast. 30 tablet 0   No current facility-administered medications for this visit.      Musculoskeletal: Strength & Muscle Tone: within normal limits Gait & Station: normal Patient leans: N/A  Psychiatric Specialty Exam: ROS  Blood pressure 131/80, pulse 83, height 5\' 6"  (1.676 m), weight 172 lb (78 kg).Body mass index is 27.76 kg/m.  General Appearance: Casual and Fairly Groomed  Eye Contact:  Good  Speech:  Clear and Coherent and Normal Rate  Volume:  Normal  Mood:  Euthymic  Affect:  Congruent  Thought Process:  Coherent, Goal Directed and Descriptions of Associations: Intact  Orientation:  Full (Time, Place, and Person)  Thought Content: Logical   Suicidal  Thoughts:  No  Homicidal Thoughts:  No  Memory:  Immediate;   Good  Judgement:  Fair  Insight:  Good  Psychomotor Activity:  Normal  Concentration:  Concentration: Fair  Recall:  Fair  Fund of Knowledge: Fair  Language: Good  Akathisia:  Negative  Handed:  Right  AIMS (if indicated): not done  Assets:  Communication Skills Desire for Improvement Financial Resources/Insurance Housing Intimacy Leisure Time Physical Health Resilience Social Support Talents/Skills Transportation Vocational/Educational  ADL's:  Intact  Cognition: WNL  Sleep:  Good   Screenings: PHQ2-9     Office Visit from 10/09/2016 in [facility] Office Visit from 08/28/2016 in [facility] Office Visit from 11/27/2015 in Primary Care at [facility] Visit from 10/09/2015 in Primary Care at [facility] Visit from 11/28/2014 in Primary Care at [facility] Total Score  0  0  6  0  0  PHQ-9 Total Score  -  0  13  -  -     Assessment and Plan:  Frances Wilson presents with overall stable mood in the setting of multiple family tragedies.  She is sleeping well with trazodone.  She continues to have managed ADHD symptoms, and attention, hyperactivity, difficulty with procrastination and focus.  We agreed to restart Concerta given that she has tolerated that well in the past, last dose I had prescribed her was 36 mg last year, so we agreed to restart 36 mg as below.  We will follow-up in 8 weeks or sooner if needed.  1. Attention deficit hyperactivity disorder (ADHD), predominantly hyperactive type   2. Severe episode of recurrent major depressive disorder, without psychotic features (HCC)   3. Chronic post-traumatic stress disorder (PTSD)     Status of current problems: stable  Labs Ordered: No orders of the  defined types were placed in this encounter.   Labs Reviewed: n/a  Collateral Obtained/Records Reviewed: n/a  Plan:  Continue Effexor 150 mg XR Continue  trazodone nightly as needed Restart Concerta 36 mg for ADHD Patient uses Xanax very rarely for acute panic Return to clinic in 8 weeks  Burnard Leigh, MD 01/27/2018, 9:07 AM

## 2018-03-24 ENCOUNTER — Ambulatory Visit (HOSPITAL_COMMUNITY): Payer: Self-pay | Admitting: Psychiatry

## 2018-03-27 ENCOUNTER — Other Ambulatory Visit: Payer: Self-pay | Admitting: *Deleted

## 2018-03-27 DIAGNOSIS — E119 Type 2 diabetes mellitus without complications: Secondary | ICD-10-CM

## 2018-03-28 MED ORDER — DULAGLUTIDE 1.5 MG/0.5ML ~~LOC~~ SOAJ: SUBCUTANEOUS | 3 refills | Status: DC

## 2018-03-28 MED ORDER — DAPAGLIFLOZIN PROPANEDIOL 10 MG PO TABS: 10.0000 mg | ORAL_TABLET | Freq: Every day | ORAL | 3 refills | Status: DC

## 2018-03-30 ENCOUNTER — Telehealth: Payer: Self-pay | Admitting: *Deleted

## 2018-03-30 NOTE — Telephone Encounter (Signed)
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 Lamonte SakaiZimmerman Rumple, [name], [place]

## 2018-03-30 NOTE — Telephone Encounter (Signed)
-----   Message from Marthenia RollingScott Bland, DO sent at 03/28/2018  6:21 PM EDT ----- Please call patient and have her come in to see either Dr. Raymondo BandKoval or one of the doctors.  She hasn't been seen almost a year for her diabetes.  I'm refilling her med now but she need to be seen.  -Dr. Parke SimmersBland

## 2018-04-04 ENCOUNTER — Encounter (HOSPITAL_COMMUNITY): Payer: Self-pay | Admitting: Psychiatry

## 2018-04-04 ENCOUNTER — Other Ambulatory Visit: Payer: Self-pay

## 2018-04-04 ENCOUNTER — Ambulatory Visit (INDEPENDENT_AMBULATORY_CARE_PROVIDER_SITE_OTHER): Payer: 59 | Admitting: Psychiatry

## 2018-04-04 VITALS — BP 134/85 | HR 81 | Temp 97.9°F | Wt 172.8 lb

## 2018-04-04 DIAGNOSIS — Z811 Family history of alcohol abuse and dependence: Secondary | ICD-10-CM | POA: Diagnosis not present

## 2018-04-04 DIAGNOSIS — F332 Major depressive disorder, recurrent severe without psychotic features: Secondary | ICD-10-CM | POA: Diagnosis not present

## 2018-04-04 DIAGNOSIS — Z87891 Personal history of nicotine dependence: Secondary | ICD-10-CM

## 2018-04-04 DIAGNOSIS — F901 Attention-deficit hyperactivity disorder, predominantly hyperactive type: Secondary | ICD-10-CM | POA: Diagnosis not present

## 2018-04-04 MED ORDER — METHYLPHENIDATE HCL ER (OSM) 36 MG PO TBCR: 36.0000 mg | EXTENDED_RELEASE_TABLET | Freq: Every day | ORAL | 0 refills | Status: DC

## 2018-04-04 MED ORDER — TRAZODONE HCL 50 MG PO TABS: ORAL_TABLET | ORAL | 1 refills | Status: DC

## 2018-04-04 MED ORDER — VENLAFAXINE HCL ER 150 MG PO CP24: 150.0000 mg | ORAL_CAPSULE | Freq: Every day | ORAL | 0 refills | Status: DC

## 2018-04-04 NOTE — Progress Notes (Signed)
BH MD/PA/NP OP Progress Note  04/04/2018 2:20 PM Frances Wilson  MRN:  [id number]  Chief Complaint:  Chief Complaint    Follow-up; Medication Refill     HPI: Presents for medication management for ADHD.  Denies any side effects.  Mood and anxiety are stable on Effexor and trazodone.  Visit Diagnosis:    ICD-10-CM   1. Attention deficit hyperactivity disorder (ADHD), predominantly hyperactive type F90.1 methylphenidate (CONCERTA) 36 MG PO CR tablet    methylphenidate (CONCERTA) 36 MG PO CR tablet    methylphenidate (CONCERTA) 36 MG PO CR tablet  2. Severe episode of recurrent major depressive disorder, without psychotic features (HCC) F33.2 traZODone (DESYREL) 50 MG tablet    venlafaxine XR (EFFEXOR-XR) 150 MG 24 hr capsule    Past Psychiatric History: See intake H&P for full details. Reviewed, with no updates at this time.   Past Medical History:  Past Medical History:  Diagnosis Date  . Anxiety    [facility]  . Attention deficit disorder (ADD)    onset age 52; [facility]  . Depression   . Diabetes mellitus without complication (HCC) 08/19/2009  . Hypertension     Past Surgical History:  Procedure Laterality Date  . ABDOMINAL HYSTERECTOMY     ovaries intact; no cancer.  . LEG SURGERY     L hip fracture s/p ORIF.     Family Psychiatric History: See intake H&P for full details. Reviewed, with no updates at this time.   Family History:  Family History  Problem Relation Age of Onset  . Alcohol abuse Mother   . Thyroid disease Mother   . Heart disease Sister        AMI secondary to substance abuse    Social History:  Social History   Socioeconomic History  . Marital status: Divorced    Spouse name: n/a  . Number of children: 3  . Years of education: college  . Highest education level: Not on file  Occupational History  . 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 Financial resource strain: Not on file  . Food insecurity:    Worry: Not on  file    Inability: Not on file  . Transportation needs:    Medical: Not on file    Non-medical: Not on file  Tobacco Use  . Smoking status: Former Smoker    Packs/day: 1.50    Years: 18.00    Pack years: 27.00    Types: Cigarettes    Start date: 08/19/1981    Last attempt to quit: 12/18/1999    Years since quitting: 18.3  . Smokeless tobacco: Never Used  Substance and Sexual Activity  . Alcohol use: Yes    Alcohol/week: 0.0 standard drinks    Comment: Occasional use   . Drug use: No  . Sexual activity: Yes    Partners: Male    Birth control/protection: Surgical  Lifestyle  . Physical activity:    Days per week: Not on file    Minutes per session: Not on file  . Stress: Not on file  Relationships  . Social connections:    Talks on phone: Not on file    Gets together: Not on file    Attends religious service: Not on file    Active member of club or organization: Not on file    Attends meetings of clubs or organizations: Not on file    Relationship status: Not on file  Other Topics Concern  . 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      Tobacco: none      Alcohol: rare      Drugs: none; previous drug use in teenage years.      Exercise: none; joined the [employer].    Allergies:  Allergies  Allergen Reactions  . Maxipime [Cefepime] Other (See Comments)    Unknown, reports she was taken off of due to a severe reaction    Metabolic Disorder Labs: Lab Results  Component Value Date   HGBA1C 8.5 04/24/2017   No results found for: PROLACTIN Lab Results  Component Value Date   CHOL 258 (H) 10/09/2016   TRIG 190 (H) 10/09/2016   HDL 38 (L) 10/09/2016   CHOLHDL 6.8 (H) 10/09/2016   VLDL 38 (H) 10/09/2016   LDLCALC 182 (H)  10/09/2016   LDLCALC 171 (H) 11/27/2015   Lab Results  Component Value Date   TSH 0.45 01/22/2016   TSH 0.30 (L) 11/27/2015    Therapeutic Level Labs: No results found for: LITHIUM No results found for: VALPROATE No components found for:  CBMZ  Current Medications: Current Outpatient Medications  Medication Sig Dispense Refill  . ALPRAZolam (XANAX) 0.5 MG tablet Take 1 tablet (0.5 mg total) by mouth daily as needed for anxiety. 30 tablet 0  . aspirin EC 81 MG tablet Take 1 tablet (81 mg total) by mouth daily. 30 tablet 0  . atorvastatin (LIPITOR) 40 MG tablet Take 1 tablet (40 mg total) by mouth daily. 90 tablet 3  . dapagliflozin propanediol (FARXIGA) 10 MG TABS tablet Take 10 mg by mouth daily. 30 tablet 3  . Dulaglutide (TRULICITY) 1.5 MG/0.5ML SOPN INJECT CONTENTS OF 1 SYRINGE SUBCUTANEOUSLY ONCE A WEEK 4 pen 3  . glipiZIDE (GLUCOTROL) 10 MG tablet Take 1 tablet (10 mg total) by mouth 2 (two) times daily before a meal. 60 tablet 11  . lisinopril-hydrochlorothiazide (ZESTORETIC) 20-12.5 MG tablet Take 1 tablet by mouth daily. 30 tablet 11  . metFORMIN (GLUCOPHAGE-XR) 500 MG 24 hr tablet Take 1 tablet (500 mg total) by mouth 2 (two) times daily. 180 tablet 3  . Multiple Vitamin (MULTIVITAMIN WITH MINERALS) TABS tablet Take 1 tablet by mouth daily.    . traZODone (DESYREL) 50 MG tablet Take 1-2 tablets at bedtime 180 tablet 1  . venlafaxine XR (EFFEXOR-XR) 150 MG 24 hr capsule Take 1 capsule (150 mg total) by mouth daily. 90 capsule 0  . methylphenidate (CONCERTA) 36 MG PO CR tablet Take 1 tablet (36 mg total) by mouth daily before breakfast. 30 tablet 0  . [START ON 05/05/2018] methylphenidate (CONCERTA) 36 MG PO CR tablet Take 1 tablet (36 mg total) by mouth daily before breakfast. 30 tablet 0  . [START ON 06/05/2018] methylphenidate (CONCERTA) 36 MG PO CR tablet Take 1 tablet (36 mg total) by mouth daily before breakfast. 30 tablet 0   No current facility-administered medications  for this visit.      Musculoskeletal: Strength & Muscle Tone: within normal limits Gait & Station: normal Patient leans: N/A  Psychiatric Specialty Exam: ROS  Blood pressure 134/85, pulse 81, temperature 97.9 F (36.6 C), temperature source Oral, weight 172 lb 12.8 oz (78.4 kg).Body mass index  is 27.89 kg/m.  General Appearance: Casual and Fairly Groomed  Eye Contact:  Good  Speech:  Clear and Coherent and Normal Rate  Volume:  Normal  Mood:  Euthymic  Affect:  Congruent  Thought Process:  Coherent, Goal Directed and Descriptions of Associations: Intact  Orientation:  Full (Time, Place, and Person)  Thought Content: Logical   Suicidal Thoughts:  No  Homicidal Thoughts:  No  Memory:  Immediate;   Good  Judgement:  Fair  Insight:  Good  Psychomotor Activity:  Normal  Concentration:  Concentration: Fair  Recall:  Fair  Fund of Knowledge: Fair  Language: Good  Akathisia:  Negative  Handed:  Right  AIMS (if indicated): not done  Assets:  Communication Skills Desire for Improvement Financial Resources/Insurance Housing Intimacy Leisure Time Physical Health Resilience Social Support Talents/Skills Transportation Vocational/Educational  ADL's:  Intact  Cognition: WNL  Sleep:  Good   Screenings: PHQ2-9     [facility]  PHQ-2 Total Score  0  0  6  0  0  PHQ-9 Total Score  -  0  13  -  -     Assessment and Plan:  Frances Wilson reports stable mood and anxiety, ADHD medications tolerated well.  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  She is continuing to use Xanax very sparingly.  Reports sleep is fair at night, we agreed to increase trazodone to 100 mg nightly.  She  denies any other significant concerns at this time and we will follow-up in 3 months or sooner if needed.  1. Attention deficit hyperactivity disorder (ADHD), predominantly hyperactive type   2. Severe episode of recurrent major depressive disorder, without psychotic features (HCC)     Status of current problems: stable  Labs Ordered: No orders of the defined types were placed in this encounter.   Labs Reviewed: n/a  Collateral Obtained/Records Reviewed: n/a  Plan:  Continue Effexor 150 mg XR Continue trazodone 50-100 mg nightly as needed Continue Concerta 36 mg for ADHD Patient uses Xanax very rarely for acute panic Return to clinic in 12 weeks  Burnard LeighAlexander Arya Timaya Bojarski, MD 04/04/2018, 2:20 PM

## 2018-04-09 ENCOUNTER — Emergency Department (HOSPITAL_COMMUNITY)
Admission: EM | Admit: 2018-04-09 | Discharge: 2018-04-10 | Disposition: A | Discharge status: Home / Self Care | Payer: 59 | Attending: Emergency Medicine | Admitting: Emergency Medicine

## 2018-04-09 ENCOUNTER — Emergency Department (HOSPITAL_COMMUNITY): Payer: 59

## 2018-04-09 ENCOUNTER — Other Ambulatory Visit: Payer: Self-pay

## 2018-04-09 ENCOUNTER — Encounter (HOSPITAL_COMMUNITY): Payer: Self-pay | Admitting: *Deleted

## 2018-04-09 DIAGNOSIS — Z79899 Other long term (current) drug therapy: Secondary | ICD-10-CM | POA: Diagnosis not present

## 2018-04-09 DIAGNOSIS — Z7984 Long term (current) use of oral hypoglycemic drugs: Secondary | ICD-10-CM | POA: Diagnosis not present

## 2018-04-09 DIAGNOSIS — Z7982 Long term (current) use of aspirin: Secondary | ICD-10-CM | POA: Insufficient documentation

## 2018-04-09 DIAGNOSIS — K529 Noninfective gastroenteritis and colitis, unspecified: Secondary | ICD-10-CM | POA: Diagnosis not present

## 2018-04-09 DIAGNOSIS — E119 Type 2 diabetes mellitus without complications: Secondary | ICD-10-CM | POA: Diagnosis not present

## 2018-04-09 DIAGNOSIS — F909 Attention-deficit hyperactivity disorder, unspecified type: Secondary | ICD-10-CM | POA: Diagnosis not present

## 2018-04-09 DIAGNOSIS — I10 Essential (primary) hypertension: Secondary | ICD-10-CM | POA: Insufficient documentation

## 2018-04-09 DIAGNOSIS — R112 Nausea with vomiting, unspecified: Secondary | ICD-10-CM | POA: Diagnosis present

## 2018-04-09 DIAGNOSIS — Z87891 Personal history of nicotine dependence: Secondary | ICD-10-CM | POA: Diagnosis not present

## 2018-04-09 LAB — URINALYSIS, ROUTINE W REFLEX MICROSCOPIC
BACTERIA UA: NONE SEEN
BILIRUBIN URINE: NEGATIVE
Glucose, UA: >=500 mg/dL — AB
Ketones, ur: 80 mg/dL — AB
Leukocytes, UA: NEGATIVE
NITRITE: NEGATIVE
PH: 8 (ref 5.0–8.0)
Protein, ur: NEGATIVE mg/dL
Specific Gravity, Urine: 1.036 — ABNORMAL HIGH (ref 1.005–1.030)

## 2018-04-09 LAB — COMPREHENSIVE METABOLIC PANEL
ALBUMIN: 4.1 g/dL (ref 3.5–5.0)
ALK PHOS: 95 U/L (ref 38–126)
ALT: 18 U/L (ref 0–44)
AST: 23 U/L (ref 15–41)
Anion gap: 10 (ref 5–15)
BILIRUBIN TOTAL: 1 mg/dL (ref 0.3–1.2)
BUN: 9 mg/dL (ref 6–20)
CALCIUM: 9.8 mg/dL (ref 8.9–10.3)
CO2: 27 mmol/L (ref 22–32)
Chloride: 103 mmol/L (ref 98–111)
Creatinine, Ser: 0.87 mg/dL (ref 0.44–1.00)
GFR calc Af Amer: >60 mL/min (ref >60)
GFR calc non Af Amer: >60 mL/min (ref >60)
GLUCOSE: 170 mg/dL — AB (ref 70–99)
POTASSIUM: 3.8 mmol/L (ref 3.5–5.1)
Sodium: 140 mmol/L (ref 135–145)
TOTAL PROTEIN: 7.6 g/dL (ref 6.5–8.1)

## 2018-04-09 LAB — CBC
HEMATOCRIT: 44 % (ref 36.0–46.0)
Hemoglobin: 14.2 g/dL (ref 12.0–15.0)
MCH: 26.9 pg (ref 26.0–34.0)
MCHC: 32.3 g/dL (ref 30.0–36.0)
MCV: 83.3 fL (ref 78.0–100.0)
Platelets: 351 10*3/uL (ref 150–400)
RBC: 5.28 MIL/uL — ABNORMAL HIGH (ref 3.87–5.11)
RDW: 13.2 % (ref 11.5–15.5)
WBC: 11.7 10*3/uL — ABNORMAL HIGH (ref 4.0–10.5)

## 2018-04-09 LAB — I-STAT BETA HCG BLOOD, ED (MC, WL, AP ONLY)

## 2018-04-09 LAB — LIPASE, BLOOD: Lipase: 29 U/L (ref 11–51)

## 2018-04-09 MED ORDER — OXYCODONE-ACETAMINOPHEN 5-325 MG PO TABS
1.0000 | ORAL_TABLET | ORAL | Status: DC | PRN
  Administered 2018-04-09: 1 via ORAL
  Filled 2018-04-09: qty 1

## 2018-04-09 MED ORDER — ONDANSETRON 4 MG PO TBDP
4.0000 mg | ORAL_TABLET | Freq: Once | ORAL | Status: AC | PRN
  Administered 2018-04-09: 4 mg via ORAL
  Filled 2018-04-09: qty 1

## 2018-04-09 MED ORDER — MORPHINE SULFATE (PF) 4 MG/ML IV SOLN
4.0000 mg | Freq: Once | INTRAVENOUS | Status: AC
  Administered 2018-04-09: 4 mg via INTRAVENOUS
  Filled 2018-04-09: qty 1

## 2018-04-09 MED ORDER — IOPAMIDOL (ISOVUE-300) INJECTION 61%
100.0000 mL | Freq: Once | INTRAVENOUS | Status: AC | PRN
  Administered 2018-04-09: 100 mL via INTRAVENOUS

## 2018-04-09 MED ORDER — SODIUM CHLORIDE 0.9 % IV BOLUS
1000.0000 mL | Freq: Once | INTRAVENOUS | Status: AC
  Administered 2018-04-09: 1000 mL via INTRAVENOUS

## 2018-04-09 MED ORDER — DICYCLOMINE HCL 10 MG/ML IM SOLN
20.0000 mg | Freq: Once | INTRAMUSCULAR | Status: AC
  Administered 2018-04-09: 20 mg via INTRAMUSCULAR
  Filled 2018-04-09: qty 2

## 2018-04-09 MED ORDER — IOPAMIDOL (ISOVUE-300) INJECTION 61%
INTRAVENOUS | Status: AC
  Filled 2018-04-09: qty 100

## 2018-04-09 NOTE — ED Notes (Signed)
Patient transported to CT 

## 2018-04-09 NOTE — ED Triage Notes (Signed)
Pt started having NV this morning with diarrhea that started at 1pm. Pt reports worsening pain from the belly button down.

## 2018-04-09 NOTE — ED Notes (Signed)
On coming RN will give meds when pt returns from [place]

## 2018-04-10 LAB — POC OCCULT BLOOD, ED: FECAL OCCULT BLD: POSITIVE — AB

## 2018-04-10 MED ORDER — ONDANSETRON 4 MG PO TBDP: 4.0000 mg | ORAL_TABLET | Freq: Three times a day (TID) | ORAL | 0 refills | Status: DC | PRN

## 2018-04-10 MED ORDER — CIPROFLOXACIN HCL 500 MG PO TABS: 500.0000 mg | ORAL_TABLET | Freq: Two times a day (BID) | ORAL | 0 refills | Status: DC

## 2018-04-10 MED ORDER — METRONIDAZOLE 500 MG PO TABS
500.0000 mg | ORAL_TABLET | Freq: Once | ORAL | Status: AC
  Administered 2018-04-10: 500 mg via ORAL
  Filled 2018-04-10: qty 1

## 2018-04-10 MED ORDER — DICYCLOMINE HCL 20 MG PO TABS: 20.0000 mg | ORAL_TABLET | Freq: Two times a day (BID) | ORAL | 0 refills | Status: DC

## 2018-04-10 MED ORDER — METRONIDAZOLE 500 MG PO TABS: 500.0000 mg | ORAL_TABLET | Freq: Three times a day (TID) | ORAL | 0 refills | Status: DC

## 2018-04-10 MED ORDER — CIPROFLOXACIN HCL 500 MG PO TABS
500.0000 mg | ORAL_TABLET | Freq: Once | ORAL | Status: AC
  Administered 2018-04-10: 500 mg via ORAL
  Filled 2018-04-10: qty 1

## 2018-04-10 NOTE — ED Provider Notes (Signed)
[facility] Provider Note   CSN: [id number] Arrival date & time: 04/09/18  2102     History   Chief Complaint Chief Complaint  Patient presents with  . Abdominal Pain    HPI Frances Wilson is a 52 y.o. female.  HPI 52 year old African-American female past medical history significant for diabetes and hypertension presents to the ED for evaluation of nausea, vomiting, diarrhea, hematochezia and abdominal pain.  Onset of symptoms around 1 PM this afternoon.  Patient reports generalized abdominal cramping that has been constant.  She states that her symptoms began after eating [employer] this afternoon.  She reports that she has had several loose stools, she was concerned that she may have a viral GI illness however this evening she started to have some blood in her stool and was concerned and came to the ED for further evaluation.  Patient states that she continues to have bloody diarrhea.  She reports low-grade fever.  She denies any vomiting at this time.  She has not taken anything for her symptoms prior to arrival.  No known sick contacts.  No recent travel.  No new foods.  Nothing makes her symptoms better or worse.  Pt denies any fever, chill, ha, vision changes, lightheadedness, dizziness, congestion, neck pain, cp, sob, cough,  urinary symptoms, hematochezia, lower extremity paresthesias.  Past Medical History:  Diagnosis Date  . Anxiety    [facility]  . Attention deficit disorder (ADD)    onset age 52; [facility]  . Depression   . Diabetes mellitus without complication (HCC) 08/19/2009  . Hypertension     Patient Active Problem List   Diagnosis Date Noted  . Chronic post-traumatic stress disorder (PTSD) 11/14/2016  . Hyperlipidemia 01/22/2016  . Anxiety and depression 11/29/2014  . Essential hypertension, benign 11/29/2014  . Goiter 11/29/2014  . Diabetes type 2, controlled (HCC) 09/23/2014  . ADHD (attention deficit  hyperactivity disorder) 09/23/2014    Past Surgical History:  Procedure Laterality Date  . ABDOMINAL HYSTERECTOMY     ovaries intact; no cancer.  . LEG SURGERY     L hip fracture s/p ORIF.      OB History   None      Home Medications    Prior to Admission medications   Medication Sig Start Date End Date Taking? Authorizing Provider  ALPRAZolam Prudy Feeler(XANAX) 0.5 MG tablet Take 1 tablet (0.5 mg total) by mouth daily as needed for anxiety. 12/09/17   Burnard LeighEksir, Alexander Arya, MD  aspirin EC 81 MG tablet Take 1 tablet (81 mg total) by mouth daily. 10/09/16   Arvilla MarketWallace, Catherine Lauren, DO  atorvastatin (LIPITOR) 40 MG tablet Take 1 tablet (40 mg total) by mouth daily. 10/28/16   Moses MannersHensel, William A, MD  ciprofloxacin (CIPRO) 500 MG tablet Take 1 tablet (500 mg total) by mouth 2 (two) times daily. 04/10/18   Rise MuLeaphart, Keliah Harned T, PA-C  dapagliflozin propanediol (FARXIGA) 10 MG TABS tablet Take 10 mg by mouth daily. 03/28/18   Marthenia RollingBland, Scott, DO  dicyclomine (BENTYL) 20 MG tablet Take 1 tablet (20 mg total) by mouth 2 (two) times daily. 04/10/18   Rise MuLeaphart, Kadynce Bonds T, PA-C  Dulaglutide (TRULICITY) 1.5 MG/0.5ML SOPN INJECT CONTENTS OF 1 SYRINGE SUBCUTANEOUSLY ONCE A WEEK 03/28/18   Bland, Scott, DO  glipiZIDE (GLUCOTROL) 10 MG tablet Take 1 tablet (10 mg total) by mouth 2 (two) times daily before a meal. 09/30/16   Hensel, Santiago BumpersWilliam A, MD  lisinopril-hydrochlorothiazide (ZESTORETIC) 20-12.5 MG tablet Take 1  tablet by mouth daily. 09/30/16   Moses Manners, MD  metFORMIN (GLUCOPHAGE-XR) 500 MG 24 hr tablet Take 1 tablet (500 mg total) by mouth 2 (two) times daily. 12/01/17   Arvilla Market, DO  methylphenidate (CONCERTA) 36 MG PO CR tablet Take 1 tablet (36 mg total) by mouth daily before breakfast. 04/04/18 05/04/18  Burnard Leigh, MD  methylphenidate (CONCERTA) 36 MG PO CR tablet Take 1 tablet (36 mg total) by mouth daily before breakfast. 05/05/18 06/04/18  Burnard Leigh, MD    methylphenidate (CONCERTA) 36 MG PO CR tablet Take 1 tablet (36 mg total) by mouth daily before breakfast. 06/05/18 07/05/18  Eksir, Bo Mcclintock, MD  metroNIDAZOLE (FLAGYL) 500 MG tablet Take 1 tablet (500 mg total) by mouth 3 (three) times daily. DO NOT CONSUME ALCOHOL WHILE TAKING THIS MEDICATION. 04/10/18   Rise Mu, PA-C  Multiple Vitamin (MULTIVITAMIN WITH MINERALS) TABS tablet Take 1 tablet by mouth daily.    [provider]  ondansetron (ZOFRAN ODT) 4 MG disintegrating tablet Take 1 tablet (4 mg total) by mouth every 8 (eight) hours as needed for nausea or vomiting. 04/10/18   Rise Mu, PA-C  traZODone (DESYREL) 50 MG tablet Take 1-2 tablets at bedtime 04/04/18   Burnard Leigh, MD  venlafaxine XR (EFFEXOR-XR) 150 MG 24 hr capsule Take 1 capsule (150 mg total) by mouth daily. 04/04/18 04/04/19  Burnard Leigh, MD    Family History Family History  Problem Relation Age of Onset  . Alcohol abuse Mother   . Thyroid disease Mother   . Heart disease Sister        AMI secondary to substance abuse    Social History Social History   Tobacco Use  . Smoking status: Former Smoker    Packs/day: 1.50    Years: 18.00    Pack years: 27.00    Types: Cigarettes    Start date: 08/19/1981    Last attempt to quit: 12/18/1999    Years since quitting: 18.3  . Smokeless tobacco: Never Used  Substance Use Topics  . Alcohol use: Yes    Alcohol/week: 0.0 standard drinks    Comment: Occasional use   . Drug use: No     Allergies   Maxipime [cefepime]   Review of Systems Review of Systems  All other systems reviewed and are negative.    Physical Exam Updated Vital Signs BP (!) 132/93 (BP Location: Left Arm)   Pulse 82   Temp 98.6 F (37 C) (Oral)   Resp 17   SpO2 99%   Physical Exam  Constitutional: She appears well-developed and well-nourished. No distress.  HENT:  Head: Normocephalic and atraumatic.  Eyes: Right eye exhibits no  discharge. Left eye exhibits no discharge. No scleral icterus.  Neck: Normal range of motion.  Cardiovascular: Normal rate, regular rhythm and normal heart sounds.  Pulmonary/Chest: Effort normal and breath sounds normal. No respiratory distress.  Abdominal: Soft. Normal appearance. Bowel sounds are increased. There is tenderness in the suprapubic area and left lower quadrant. There is no rigidity, no rebound, no guarding, no CVA tenderness, no tenderness at McBurney's point and negative Murphy's sign.  Genitourinary:  Genitourinary Comments: Chaperone present for exam. Pt tolerated without difficulty. No external hemorrhoids or fissures noted. No pain with palpation of the rectal vault. No internal hemorrhoids noted. Soft brown stool noted in the rectal vault. No gross hematochezia or melena. Hemoccult positive.   Musculoskeletal: Normal range of motion.  Neurological:  She is alert.  Skin: Skin is warm and dry. Capillary refill takes less than 2 seconds. No pallor.  Psychiatric: Her behavior is normal. Judgment and thought content normal.  Nursing note and vitals reviewed.    ED Treatments / Results  Labs (all labs ordered are listed, but only abnormal results are displayed) Labs Reviewed  COMPREHENSIVE METABOLIC PANEL - Abnormal; Notable for the following components:      Result Value   Glucose, Bld 170 (*)    All other components within normal limits  CBC - Abnormal; Notable for the following components:   WBC 11.7 (*)    RBC 5.28 (*)    All other components within normal limits  URINALYSIS, ROUTINE W REFLEX MICROSCOPIC - Abnormal; Notable for the following components:   Specific Gravity, Urine 1.036 (*)    Glucose, UA >=500 (*)    Hgb urine dipstick SMALL (*)    Ketones, ur 80 (*)    All other components within normal limits  POC OCCULT BLOOD, ED - Abnormal; Notable for the following components:   Fecal Occult Bld POSITIVE (*)    All other components within normal limits    GASTROINTESTINAL PANEL BY PCR, STOOL (REPLACES STOOL CULTURE)  LIPASE, BLOOD  I-STAT BETA HCG BLOOD, ED ([facility], [facility], [facility] ONLY)    EKG None  Radiology Ct Abdomen Pelvis W Contrast  Result Date: 04/10/2018 CLINICAL DATA:  Nausea and vomiting beginning this morning, diarrhea beginning this afternoon. History of hysterectomy. EXAM: CT ABDOMEN AND PELVIS WITH CONTRAST TECHNIQUE: Multidetector CT imaging of the abdomen and pelvis was performed using the standard protocol following bolus administration of intravenous contrast. CONTRAST:  ISOVUE-300 IOPAMIDOL (ISOVUE-300) INJECTION 61% COMPARISON:  CT abdomen and pelvis February 18, 2017 FINDINGS: LOWER CHEST: RIGHT middle lobe subpleural scarring associated with old rib fracture. Smaller 6 mm subsolid LEFT lower lobe pulmonary nodule. Included heart size is normal. Small, decreased from prior study pericardial effusion. HEPATOBILIARY: Liver and gallbladder are normal. PANCREAS: Normal. SPLEEN: Normal. ADRENALS/URINARY TRACT: Kidneys are orthotopic, demonstrating symmetric enhancement. No nephrolithiasis, hydronephrosis or solid renal masses. The unopacified ureters are normal in course and caliber. Delayed imaging through the kidneys demonstrates symmetric prompt contrast excretion within the proximal urinary collecting system. Urinary bladder is partially distended and unremarkable. Normal adrenal glands. STOMACH/BOWEL: Very small hiatal hernia. Mild descending and rectosigmoid colonic wall thickening and pericolonic inflammation. VASCULAR/LYMPHATIC: Aortoiliac vessels are normal in course and caliber. Moderate intimal thickening calcific atherosclerosis. No lymphadenopathy by CT size criteria. REPRODUCTIVE: Status post hysterectomy. OTHER: No intraperitoneal free fluid or free air. MUSCULOSKELETAL: Nonacute. Status post LEFT femoral neck pinning. Mild degenerative change of the thoracic spine. Moderate RIGHT L4-5 facet arthropathy. Very small fat containing  umbilical hernia. IMPRESSION: 1. Mild colitis. Aortic Atherosclerosis (ICD10-I70.0). Electronically Signed   By: Awilda Metro M.D.   On: 04/10/2018 00:05    Procedures Procedures (including critical care time)  Medications Ordered in ED Medications  ondansetron (ZOFRAN-ODT) disintegrating tablet 4 mg (4 mg Oral Given 04/09/18 2122)  sodium chloride 0.9 % bolus 1,000 mL (0 mLs Intravenous Stopped 04/10/18 0103)  morphine 4 MG/ML injection 4 mg (4 mg Intravenous Given 04/09/18 2353)  dicyclomine (BENTYL) injection 20 mg (20 mg Intramuscular Given 04/09/18 2354)  iopamidol (ISOVUE-300) 61 % injection 100 mL (100 mLs Intravenous Contrast Given 04/09/18 2334)  ciprofloxacin (CIPRO) tablet 500 mg (500 mg Oral Given 04/10/18 0131)  metroNIDAZOLE (FLAGYL) tablet 500 mg (500 mg Oral Given 04/10/18 0131)     Initial  Impression / Assessment and Plan / ED Course  I have reviewed the triage vital signs and the nursing notes.  Pertinent labs & imaging results that were available during my care of the patient were reviewed by me and considered in my medical decision making (see chart for details).     Patient presents to the ED for evaluation of bloody stools, abdominal pain, nausea and vomiting.  On assessment patient has a low-grade temperature of 100.1.  She has no tachycardia or hypotension.  Exam patient does have pain to palpation the left lower abdomen.  Bowel sounds present in all 4 quadrants.  No rebound or guarding.  Lab work with mild leukocytosis with history of same.  Normal hemoglobin.  No significant electrolyte derangement.  Mildly elevated glucose of 170.  BUN and creatinine are normal.  Normal liver enzymes.  UA shows no signs of infection.  Normal lipase and negative pregnancy test.  Occult stool was positive.  CT imaging was performed that shows concerns for mild colitis.  Incidental findings of aortic atherosclerosis and pulmonary nodules were seen and discussed with patient.  Given  patient's low-grade fever, white count and bloody diarrhea we will treat antibiotics for possible infectious diarrhea.  Patient given Cipro and Flagyl.  She is tolerating p.o. fluids in the ED. pain improved with medication.  Repeat abdominal exam shows no signs of surgical abdomen.  Patient unable to provide stool sample in the ED.  Will need close outpatient follow-up.  Discussed reasons to return the ED immediately.  Patient does not meet Sirs or sepsis criteria.  Pt is hemodynamically stable, in NAD, & able to ambulate in the ED. Evaluation does not show pathology that would require ongoing emergent intervention or inpatient treatment. I explained the diagnosis to the patient. Pain has been managed & has no complaints prior to dc. Pt is comfortable with above plan and is stable for discharge at this time. All questions were answered prior to disposition. Strict return precautions for f/u to the ED were discussed. Encouraged follow up with PCP.  Patient was discussed with my attending who is agreeable the above plan.  Final Clinical Impressions(s) / ED Diagnoses   Final diagnoses:  Colitis    ED Discharge Orders         Ordered    metroNIDAZOLE (FLAGYL) 500 MG tablet  3 times daily     04/10/18 0125    ciprofloxacin (CIPRO) 500 MG tablet  2 times daily     04/10/18 0125    ondansetron (ZOFRAN ODT) 4 MG disintegrating tablet  Every 8 hours PRN     04/10/18 0126    dicyclomine (BENTYL) 20 MG tablet  2 times daily     04/10/18 0126           Rise Mu, PA-C 04/10/18 0617    Derwood Kaplan, MD 04/10/18 [phone]

## 2018-04-10 NOTE — Discharge Instructions (Signed)
Your CAT scan does show signs of inflammation of your intestines called colitis.  Have given you antibiotics to take.  Do not drink alcohol with these medications of the will make you sick.  May take Motrin and Tylenol for any fevers or pain.  Have also given you Bentyl to help with abdominal cramping.  Also given you Zofran to help with nausea.  If you are not improving the next 2 to 3 days or you acutely worsen with worsening abdominal pain, worsening bloody stools or high fevers return the ED immediately.

## 2018-04-14 ENCOUNTER — Other Ambulatory Visit: Payer: Self-pay

## 2018-04-14 ENCOUNTER — Ambulatory Visit (INDEPENDENT_AMBULATORY_CARE_PROVIDER_SITE_OTHER): Payer: 59 | Admitting: Family Medicine

## 2018-04-14 VITALS — BP 138/84 | HR 81 | Temp 98.5°F | Ht 67.0 in | Wt 171.0 lb

## 2018-04-14 DIAGNOSIS — Z23 Encounter for immunization: Secondary | ICD-10-CM

## 2018-04-14 DIAGNOSIS — K529 Noninfective gastroenteritis and colitis, unspecified: Secondary | ICD-10-CM

## 2018-04-14 DIAGNOSIS — R911 Solitary pulmonary nodule: Secondary | ICD-10-CM | POA: Diagnosis not present

## 2018-04-14 DIAGNOSIS — E119 Type 2 diabetes mellitus without complications: Secondary | ICD-10-CM | POA: Diagnosis not present

## 2018-04-14 HISTORY — DX: Noninfective gastroenteritis and colitis, unspecified: K52.9

## 2018-04-14 LAB — POCT GLYCOSYLATED HEMOGLOBIN (HGB A1C): HbA1c, POC (controlled diabetic range): 8 % — AB (ref 0.0–7.0)

## 2018-04-14 LAB — POCT HEMOGLOBIN: HEMOGLOBIN: 13.1 g/dL (ref 12.2–16.2)

## 2018-04-14 NOTE — Assessment & Plan Note (Signed)
Given multiple episodes, curious whether this is actually autoimmune/IBD. Have replaced GI referral and explained to patient why this is important.  She denies continued blood in stools.  Hemoglobin stable on finger pricked today.  Return if worsening prior to GI evaluation.  She also needs colonoscopy for screening purposes, which she has not had.  Finish Cipro Flagyl.

## 2018-04-14 NOTE — Progress Notes (Signed)
CC: ED f/u  HPI  Colitis [facility] min [facility], then had vomiting and diarrhea and sweating. Had blood squirts with BM that evening. Then went to the ED and diagnosed with colitis. Blood in BMs lasted the next day and one Saturday morning. No blood since then. Lives alone, no other sick contacts. No more vomiting since Thursday. Felt lightheaded on ED visit, but no longer.  Still taking both cipro and flagyl. This is the third time this has happened in the last year or so. First time she did go to the ED (appears to be 02/2017). She thinks that visit was food poisoning because she feels she ate undercooked meat at that time. No BM since saturday. ED gave her GI follow up but No GI eval since that visit.  Denies family or personal hx of IBD.   Lung nodule - Used to smoke, quit 2001. No SOB or cough. This was the first she had heard of this.   Dm - metformin BID And farxiga and truclicity. Doesn't really check CBGs. She is pleased with her a1c improvement. She is pleased with her intentional weight loss. Tries to avoid sugars and carbs but has "cheated with ice cream" this summer.  Wt Readings from Last 3 Encounters:  04/14/18 171 lb (77.6 kg)  04/24/17 180 lb 12.8 oz (82 kg)  03/20/17 181 lb (82.1 kg)    ROS: Denies CP, SOB, abdominal pain, dysuria, + changes in BMs.   CC, SH/smoking status, and VS noted  Objective: BP 138/84   Pulse 81   Temp 98.5 F (36.9 C) (Oral)   Ht 5\' 7"  (1.702 m)   Wt 171 lb (77.6 kg)   SpO2 98%   BMI 26.78 kg/m  Gen: NAD, alert, cooperative, and pleasant. HEENT: NCAT, EOMI, PERRL CV: RRR, no murmur Resp: CTAB, no wheezes, non-labored Abd: soft, mild LLQ tenderness to deep palpation, otherwise nontender, BS present, no guarding or organomegaly Ext: No edema, warm Neuro: Alert and oriented, Speech clear, No gross deficits  Assessment and plan:  Acute hemorrhagic colitis Given multiple episodes, curious whether this is  actually autoimmune/IBD. Have replaced GI referral and explained to patient why this is important.  She denies continued blood in stools.  Hemoglobin stable on finger pricked today.  Return if worsening prior to GI evaluation.  She also needs colonoscopy for screening purposes, which she has not had.  Finish Cipro Flagyl.  Diabetes type 2, controlled (HCC) Improved control today despite dietary indiscretion and lack of checking CBGs at home.  Patient is adamant she will not take extra medication.  Have encouraged her to follow-up with her PCP later this fall to continue to titrate her regimen.  She has not seen the eye doctor and is due for other diabetic health maintenance.  She says she is buying a house and will work on her healthcare maintenance later this fall after she completes this.  Lung nodule seen on imaging study Incidental finding on imaging.  Given that it has not changed since initial incidental finding of July 2018, should consider repeat noncontrast CT in 6 to 12 months.  Have discussed this with patient.   Orders Placed This Encounter  Procedures  . Flu Vaccine QUAD 36+ mos IM  . Ambulatory referral to Gastroenterology    Referral Priority:   Routine    Referral Type:   Consultation    Referral Reason:   Specialty Services Required    Number  of Visits Requested:   1  . POCT glycosylated hemoglobin (Hb A1C)  . Hemoglobin    No orders of the defined types were placed in this encounter.  Loni Muse, MD, PGY3 04/15/2018 8:58 AM

## 2018-04-14 NOTE — Patient Instructions (Signed)
It was a pleasure to see you today! Thank you for choosing [facility] for your primary care. Frances Wilson was seen for follow up ED, diabetes.   Our plans for today were:  Your blood count looks ok today. Finish the antibiotics and follow up with the GI doctor once they call you.   Keep your diabetes medicines the same and schedule an appt to see Dr. Parke SimmersBland soon.   Your lung nodule needs repeat imaging in 6 months.    Best,  Dr. Chanetta Marshallimberlake

## 2018-04-14 NOTE — Assessment & Plan Note (Signed)
Improved control today despite dietary indiscretion and lack of checking CBGs at home.  Patient is adamant she will not take extra medication.  Have encouraged her to follow-up with her PCP later this fall to continue to titrate her regimen.  She has not seen the eye doctor and is due for other diabetic health maintenance.  She says she is buying a house and will work on her healthcare maintenance later this fall after she completes this.

## 2018-04-15 DIAGNOSIS — R911 Solitary pulmonary nodule: Secondary | ICD-10-CM | POA: Insufficient documentation

## 2018-04-15 NOTE — Assessment & Plan Note (Signed)
Incidental finding on imaging.  Given that it has not changed since initial incidental finding of July 2018, should consider repeat noncontrast CT in 6 to 12 months.  Have discussed this with patient.

## 2018-04-16 ENCOUNTER — Encounter: Payer: Self-pay | Admitting: Gastroenterology

## 2018-04-22 ENCOUNTER — Encounter: Payer: Self-pay | Admitting: Gastroenterology

## 2018-04-22 ENCOUNTER — Ambulatory Visit: Payer: Managed Care, Other (non HMO) | Admitting: Gastroenterology

## 2018-04-22 VITALS — BP 138/78 | HR 86 | Ht 67.0 in | Wt 170.0 lb

## 2018-04-22 DIAGNOSIS — K5900 Constipation, unspecified: Secondary | ICD-10-CM

## 2018-04-22 DIAGNOSIS — K529 Noninfective gastroenteritis and colitis, unspecified: Secondary | ICD-10-CM

## 2018-04-22 DIAGNOSIS — K625 Hemorrhage of anus and rectum: Secondary | ICD-10-CM

## 2018-04-22 DIAGNOSIS — R935 Abnormal findings on diagnostic imaging of other abdominal regions, including retroperitoneum: Secondary | ICD-10-CM

## 2018-04-22 MED ORDER — POLYETHYLENE GLYCOL 3350 17 G PO PACK: 17.0000 g | PACK | Freq: Every day | ORAL | 0 refills | Status: DC

## 2018-04-22 MED ORDER — SUPREP BOWEL PREP KIT 17.5-3.13-1.6 GM/177ML PO SOLN: ORAL | 0 refills | Status: DC

## 2018-04-22 NOTE — Progress Notes (Signed)
HPI :  52 year old female with a history of diabetes, hypertension, depression, referred here by Garth Bigness, MD for episodic abdominal pain and abnormal CT scan.  The patient reports she was in the emergency room a few weeks ago for which she describes as "food poisoning". She reports she developed nausea vomiting, lower mid abdominal pain, diaphoresis and a temperature. This was followed by passing bright red blood per rectum. She states her pain slowly improved over time but it took a few days to resolve. She was seen in the ER and had a CT scan showing mild inflammation of the left colon. She was given a course of Cipro and Flagyl and states her symptoms eventually resolved on the [place]. She has returned back to normal status over the past 1-2 weeks. She does not have any abdominal pain anymore. She denies any new medications that preceded this episode.  She states this is the third episode in the past 2 years that has occurred like this, however prior episodes were not associated with rectal bleeding. In July last year she developed severe abdominal pain associated with nausea and vomiting. CT scan again showed mild left-sided colitis during her evaluation. This symptom resolved on its own. She also had another episode in June of this year which she dealt with at home and did not seek evaluation.  At baseline she denies any blood in her stools. She has some baseline constipation which bothers her, she does not take much for that. She has not had a prior colonoscopy. She denies any family history of colon cancer. Her father had prostate cancer.  CT scan 04/10/2018 - mild desceding and rectosigmoid wall thickening - mild colitis CT scan 02/18/2017 - mild left sided colitis  Past Medical History:  Diagnosis Date  . Anxiety    [facility]  . Attention deficit disorder (ADD)    onset age 57; [facility]  . Depression   . Diabetes mellitus without complication (HCC) 08/19/2009    . Hypertension      Past Surgical History:  Procedure Laterality Date  . ABDOMINAL HYSTERECTOMY     ovaries intact; no cancer.  . LEG SURGERY     L hip fracture s/p ORIF.    Family History  Problem Relation Age of Onset  . Alcohol abuse Mother   . Thyroid disease Mother   . Heart disease Sister        AMI secondary to substance abuse  . Pancreatic cancer Maternal Grandfather   . Stomach cancer Other        GREAT AUNT  . Colon cancer Neg Hx    Social History   Tobacco Use  . Smoking status: Former Smoker    Packs/day: 1.50    Years: 18.00    Pack years: 27.00    Types: Cigarettes    Start date: 08/19/1981    Last attempt to quit: 12/18/1999    Years since quitting: 18.3  . Smokeless tobacco: Never Used  Substance Use Topics  . Alcohol use: Yes    Alcohol/week: 0.0 standard drinks    Comment: Occasional use   . Drug use: No   Current Outpatient Medications  Medication Sig Dispense Refill  . ALPRAZolam (XANAX) 0.5 MG tablet Take 1 tablet (0.5 mg total) by mouth daily as needed for anxiety. 30 tablet 0  . aspirin EC 81 MG tablet Take 1 tablet (81 mg total) by mouth daily. 30 tablet 0  . ciprofloxacin (CIPRO) 500 MG tablet Take 1  tablet (500 mg total) by mouth 2 (two) times daily. 10 tablet 0  . dapagliflozin propanediol (FARXIGA) 10 MG TABS tablet Take 10 mg by mouth daily. 30 tablet 3  . dicyclomine (BENTYL) 20 MG tablet Take 1 tablet (20 mg total) by mouth 2 (two) times daily. 20 tablet 0  . Dulaglutide (TRULICITY) 1.5 MG/0.5ML SOPN INJECT CONTENTS OF 1 SYRINGE SUBCUTANEOUSLY ONCE A WEEK 4 pen 3  . metFORMIN (GLUCOPHAGE-XR) 500 MG 24 hr tablet Take 1 tablet (500 mg total) by mouth 2 (two) times daily. 180 tablet 3  . methylphenidate (CONCERTA) 36 MG PO CR tablet Take 1 tablet (36 mg total) by mouth daily before breakfast. 30 tablet 0  . metroNIDAZOLE (FLAGYL) 500 MG tablet Take 1 tablet (500 mg total) by mouth 3 (three) times daily. DO NOT CONSUME ALCOHOL WHILE TAKING  THIS MEDICATION. 15 tablet 0  . Multiple Vitamin (MULTIVITAMIN WITH MINERALS) TABS tablet Take 1 tablet by mouth daily.    . ondansetron (ZOFRAN ODT) 4 MG disintegrating tablet Take 1 tablet (4 mg total) by mouth every 8 (eight) hours as needed for nausea or vomiting. 6 tablet 0  . traZODone (DESYREL) 50 MG tablet Take 1-2 tablets at bedtime 180 tablet 1  . venlafaxine XR (EFFEXOR-XR) 150 MG 24 hr capsule Take 1 capsule (150 mg total) by mouth daily. 90 capsule 0   No current facility-administered medications for this visit.    Allergies  Allergen Reactions  . Maxipime [Cefepime] Other (See Comments)    Unknown, reports she was taken off of due to a severe reaction     Review of Systems: All systems reviewed and negative except where noted in HPI.    Ct Abdomen Pelvis W Contrast  Result Date: 04/10/2018 CLINICAL DATA:  Nausea and vomiting beginning this morning, diarrhea beginning this afternoon. History of hysterectomy. EXAM: CT ABDOMEN AND PELVIS WITH CONTRAST TECHNIQUE: Multidetector CT imaging of the abdomen and pelvis was performed using the standard protocol following bolus administration of intravenous contrast. CONTRAST:  ISOVUE-300 IOPAMIDOL (ISOVUE-300) INJECTION 61% COMPARISON:  CT abdomen and pelvis February 18, 2017 FINDINGS: LOWER CHEST: RIGHT middle lobe subpleural scarring associated with old rib fracture. Smaller 6 mm subsolid LEFT lower lobe pulmonary nodule. Included heart size is normal. Small, decreased from prior study pericardial effusion. HEPATOBILIARY: Liver and gallbladder are normal. PANCREAS: Normal. SPLEEN: Normal. ADRENALS/URINARY TRACT: Kidneys are orthotopic, demonstrating symmetric enhancement. No nephrolithiasis, hydronephrosis or solid renal masses. The unopacified ureters are normal in course and caliber. Delayed imaging through the kidneys demonstrates symmetric prompt contrast excretion within the proximal urinary collecting system. Urinary bladder is  partially distended and unremarkable. Normal adrenal glands. STOMACH/BOWEL: Very small hiatal hernia. Mild descending and rectosigmoid colonic wall thickening and pericolonic inflammation. VASCULAR/LYMPHATIC: Aortoiliac vessels are normal in course and caliber. Moderate intimal thickening calcific atherosclerosis. No lymphadenopathy by CT size criteria. REPRODUCTIVE: Status post hysterectomy. OTHER: No intraperitoneal free fluid or free air. MUSCULOSKELETAL: Nonacute. Status post LEFT femoral neck pinning. Mild degenerative change of the thoracic spine. Moderate RIGHT L4-5 facet arthropathy. Very small fat containing umbilical hernia. IMPRESSION: 1. Mild colitis. Aortic Atherosclerosis (ICD10-I70.0). Electronically Signed   By: Awilda Metro M.D.   On: 04/10/2018 00:05   Lab Results  Component Value Date   WBC 11.7 (H) 04/09/2018   HGB 13.1 04/14/2018   HCT 44.0 04/09/2018   MCV 83.3 04/09/2018   PLT 351 04/09/2018    Lab Results  Component Value Date   CREATININE 0.87 04/09/2018  BUN 9 04/09/2018   NA 140 04/09/2018   K 3.8 04/09/2018   CL 103 04/09/2018   CO2 27 04/09/2018    Lab Results  Component Value Date   ALT 18 04/09/2018   AST 23 04/09/2018   ALKPHOS 95 04/09/2018   BILITOT 1.0 04/09/2018      Physical Exam: BP 138/78   Pulse 86   Ht 5\' 7"  (1.702 m)   Wt 170 lb (77.1 kg)   BMI 26.63 kg/m  Constitutional: Pleasant,well-developed, female in no acute distress. HEENT: Normocephalic and atraumatic. Conjunctivae are normal. No scleral icterus. Neck supple.  Cardiovascular: Normal rate, regular rhythm.  Pulmonary/chest: Effort normal and breath sounds normal. No wheezing, rales or rhonchi. Abdominal: Soft, nondistended, nontender.  There are no masses palpable. No hepatomegaly. Extremities: no edema Lymphadenopathy: No cervical adenopathy noted. Neurological: Alert and oriented to person place and time. Skin: Skin is warm and dry. No rashes noted. Psychiatric:  Normal mood and affect. Behavior is normal.   ASSESSMENT AND PLAN: 52 year old female here for new patient assessment the following issues:  Colitis / abnormal CT of the abdomen / rectal bleeding / constipation - 3 isolated episodes of acute lower abdominal pain over the past 2 years, with 2 CT scans showing left-sided colitis in this setting. This most recent episode was severe associated with rectal bleeding. Given her acute symptoms most recently, ischemic colitis is a possibility. Other etiologies include infectious colitis versus IBD. She is currently back to her baseline. If she had ischemic colitis, I don't see any high-risk medications she is taking to predispose to this. She's never had a prior colonoscopy. In light of her age and the symptoms a colonoscopy is recommended to further evaluate, rule out IBD, rule out polyps and mass lesions. I discussed risks and benefits of colonoscopy and anesthesia with her and she wanted to proceed. Further recommendations pending the results. Otherwise recommend she use MiraLAX once daily and titrate up as needed for constipation in the interim. She agreed with the plan.  Ileene Patrick, MD [facility]  CC: Garth Bigness, MD

## 2018-04-22 NOTE — Patient Instructions (Addendum)
If you are age 54 or older, your body mass index should be between 23-30. Your Body mass index is 26.63 kg/m. If this is out of the aforementioned range listed, please consider follow up with your Primary Care Provider.  If you are age 36 or younger, your body mass index should be between 19-25. Your Body mass index is 26.63 kg/m. If this is out of the aformentioned range listed, please consider follow up with your Primary Care Provider.   You have been scheduled for a colonoscopy. Please follow written instructions given to you at your visit today.  Please pick up your prep supplies at the pharmacy within the next 1-3 days. If you use inhalers (even only as needed), please bring them with you on the day of your procedure. Your physician has requested that you go to www.startemmi.com and enter the access code given to you at your visit today. This web site gives a general overview about your procedure. However, you should still follow specific instructions given to you by our office regarding your preparation for the procedure.  Please purchase the following medications over the counter and take as directed: Miralax: Take once a day and increase to twice a day as needed  Thank you for entrusting me with your care and for choosing [facility], Dr. Ileene Patrick

## 2018-05-06 ENCOUNTER — Encounter: Payer: Self-pay | Admitting: Gastroenterology

## 2018-05-06 ENCOUNTER — Ambulatory Visit (AMBULATORY_SURGERY_CENTER): Payer: Managed Care, Other (non HMO) | Admitting: Gastroenterology

## 2018-05-06 VITALS — BP 109/79 | HR 79 | Temp 97.3°F | Resp 16 | Ht 67.0 in | Wt 170.0 lb

## 2018-05-06 DIAGNOSIS — Z538 Procedure and treatment not carried out for other reasons: Secondary | ICD-10-CM

## 2018-05-06 DIAGNOSIS — K529 Noninfective gastroenteritis and colitis, unspecified: Secondary | ICD-10-CM | POA: Diagnosis present

## 2018-05-06 MED ORDER — SODIUM CHLORIDE 0.9 % IV SOLN: 500.0000 mL | Freq: Once | INTRAVENOUS | Status: DC

## 2018-05-06 NOTE — Progress Notes (Signed)
Pt's states no medical or surgical changes since previsit or office visit. 

## 2018-05-06 NOTE — Op Note (Signed)
[facility] Patient Name: Frances Wilson Procedure Date: 05/06/2018 8:44 AM MRN: [id number] Endoscopist: Viviann Spare P. Adela Lank , MD Age: 52 Referring MD:  Date of Birth: 1965-11-21 Gender: Female Account #: [id number] Procedure:                Colonoscopy Indications:              This is the patient's first colonoscopy, Abnormal                            CT of the GI tract (left sided colitis) - history                            concerning for possible ischemic colitis Medicines:                Monitored Anesthesia Care Procedure:                Pre-Anesthesia Assessment:                           - Prior to the procedure, a History and Physical                            was performed, and patient medications and                            allergies were reviewed. The patient's tolerance of                            previous anesthesia was also reviewed. The risks                            and benefits of the procedure and the sedation                            options and risks were discussed with the patient.                            All questions were answered, and informed consent                            was obtained. Prior Anticoagulants: The patient has                            taken no previous anticoagulant or antiplatelet                            agents. ASA Grade Assessment: II - A patient with                            mild systemic disease. After reviewing the risks                            and benefits, the patient was deemed in  satisfactory condition to undergo the procedure.                           After obtaining informed consent, the colonoscope                            was passed under direct vision. Throughout the                            procedure, the patient's blood pressure, pulse, and                            oxygen saturations were monitored continuously. The                            Colonoscope  was introduced through the anus with                            the intention of advancing to the cecum. The scope                            was advanced to the transverse colon before the                            procedure was aborted. Medications were given. The                            colonoscopy was technically difficult and complex                            due to inadequate bowel prep. The patient tolerated                            the procedure well. The quality of the bowel                            preparation was poor. The rectum was photographed. Scope In: 8:53:47 AM Scope Out: 9:00:56 AM Total Procedure Duration: 0 hours 7 minutes 9 seconds  Findings:                 The perianal and digital rectal examinations were                            normal.                           A large amount of semi-liquid stool was found in                            the entire colon, making visualization difficult.                            Lavage was attempted, but the colon could not be  cleared due to stool burden and due to impaired                            visualization, the transverse colon not be                            traversed and the procedure was aborted.                           The exam was otherwise without abnormality of what                            was visualized. The sigmoid colon appeared healthy                            without inflammatory changes. Complications:            No immediate complications. Estimated blood loss:                            None. Estimated Blood Loss:     Estimated blood loss: none. Impression:               - Preparation of the colon was poor, the procedure                            was aborted due to impaired visualization.                           - Of what was able to be visualized, the sigmoid                            colon appeared normal                           - Recommendation:            - Patient has a contact number available for                            emergencies. The signs and symptoms of potential                            delayed complications were discussed with the                            patient. Return to normal activities tomorrow.                            Written discharge instructions were provided to the                            patient.                           - Resume previous diet.                           -  Continue present medications.                           - Repeat colonoscopy because the bowel preparation                            was suboptimal, will discuss with the patient Willaim RayasSteven P. , MD 05/06/2018 9:08:41 AM This report has been signed electronically.

## 2018-05-06 NOTE — Patient Instructions (Signed)
Continue present medications. Reschedule colonoscopy.    YOU HAD AN ENDOSCOPIC PROCEDURE TODAY AT THE [facility]:   Refer to the procedure report that was given to you for any specific questions about what was found during the examination.  If the procedure report does not answer your questions, please call your gastroenterologist to clarify.  If you requested that your care partner not be given the details of your procedure findings, then the procedure report has been included in a sealed envelope for you to review at your convenience later.  YOU SHOULD EXPECT: Some feelings of bloating in the abdomen. Passage of more gas than usual.  Walking can help get rid of the air that was put into your GI tract during the procedure and reduce the bloating. If you had a lower endoscopy (such as a colonoscopy or flexible sigmoidoscopy) you may notice spotting of blood in your stool or on the toilet paper. If you underwent a bowel prep for your procedure, you may not have a normal bowel movement for a few days.  Please Note:  You might notice some irritation and congestion in your nose or some drainage.  This is from the oxygen used during your procedure.  There is no need for concern and it should clear up in a day or so.  SYMPTOMS TO REPORT IMMEDIATELY:   Following lower endoscopy (colonoscopy or flexible sigmoidoscopy):  Excessive amounts of blood in the stool  Significant tenderness or worsening of abdominal pains  Swelling of the abdomen that is new, acute  Fever of 100F or higher   For urgent or emergent issues, a gastroenterologist can be reached at any hour by calling [phone].   DIET:  We do recommend a small meal at first, but then you may proceed to your regular diet.  Drink plenty of fluids but you should avoid alcoholic beverages for 24 hours.  ACTIVITY:  You should plan to take it easy for the rest of today and you should NOT DRIVE or use heavy machinery until  tomorrow (because of the sedation medicines used during the test).    FOLLOW UP: Our staff will call the number listed on your records the next business day following your procedure to check on you and address any questions or concerns that you may have regarding the information given to you following your procedure. If we do not reach you, we will leave a message.  However, if you are feeling well and you are not experiencing any problems, there is no need to return our call.  We will assume that you have returned to your regular daily activities without incident.  If any biopsies were taken you will be contacted by phone or by letter within the next 1-3 weeks.  Please call us at [phone] if you have not heard about the biopsies in 3 weeks.    SIGNATURES/CONFIDENTIALITY: You and/or your care partner have signed paperwork which will be entered into your electronic medical record.  These signatures attest to the fact that that the information above on your After Visit Summary has been reviewed and is understood.  Full responsibility of the confidentiality of this discharge information lies with you and/or your care-partner.

## 2018-05-06 NOTE — Progress Notes (Signed)
Schedule is not out  past October with no available times for a colonoscopy that month. A June 19 2018  recall colonoscpy was put in for the patient and she will also need a double prep with this procedure and a previsit. SM

## 2018-05-07 ENCOUNTER — Telehealth: Payer: Self-pay | Admitting: *Deleted

## 2018-05-07 ENCOUNTER — Telehealth: Payer: Self-pay

## 2018-05-07 NOTE — Telephone Encounter (Signed)
  Follow up Call-  Call back number 05/06/2018  Post procedure Call Back phone  # [phone]  Permission to leave phone message Yes  Some recent data might be hidden     Patient questions:  Do you have a fever, pain , or abdominal swelling? No. Pain Score  0 *  Have you tolerated food without any problems? Yes.    Have you been able to return to your normal activities? Yes.    Do you have any questions about your discharge instructions: Diet   No. Medications  No. Follow up visit  No.  Do you have questions or concerns about your Care? No.  Actions: * If pain score is 4 or above: No action needed, pain <4.  No problems noted per pt.  [phone] was the correct number. maw

## 2018-05-07 NOTE — Telephone Encounter (Signed)
First follow up call attempt.  Restriction not allowing call to go through.

## 2018-06-08 ENCOUNTER — Telehealth: Payer: Self-pay | Admitting: Family Medicine

## 2018-06-09 ENCOUNTER — Encounter: Payer: Self-pay | Admitting: Family Medicine

## 2018-06-09 ENCOUNTER — Other Ambulatory Visit: Payer: Self-pay

## 2018-06-09 ENCOUNTER — Ambulatory Visit (INDEPENDENT_AMBULATORY_CARE_PROVIDER_SITE_OTHER): Payer: Managed Care, Other (non HMO) | Admitting: Family Medicine

## 2018-06-09 VITALS — BP 130/86 | HR 90 | Temp 98.2°F | Ht 67.0 in | Wt 175.6 lb

## 2018-06-09 DIAGNOSIS — Z1211 Encounter for screening for malignant neoplasm of colon: Secondary | ICD-10-CM | POA: Diagnosis not present

## 2018-06-09 DIAGNOSIS — E119 Type 2 diabetes mellitus without complications: Secondary | ICD-10-CM

## 2018-06-09 NOTE — Patient Instructions (Signed)
It was a pleasure to see you today! Thank you for choosing [facility] for your primary care. Frances Wilson was seen for diabetes followup. Come back to the clinic in 3 months and we'll recheck your A1C after you've made the diet changes you are planning, and go to the emergency room if you have any life threatening symptoms.  Don't forget to reschedule your colonoscopy and go to the eye doctor for your diabetic eye exam.  You can schedule an appt for a pap smear with the front desk, if you prefer a female provider for that, please mention it when scheduling.   Please bring all your medications to every doctors visit   Sign up for My Chart to have easy access to your labs results, and communication with your Primary care physician.     Please check-out at the front desk before leaving the clinic.     Best,  Dr. Marthenia Rolling FAMILY MEDICINE RESIDENT - PGY2 06/09/2018 1:56 PM

## 2018-06-09 NOTE — Assessment & Plan Note (Signed)
Patient not controlled with A1C >8.  Declines recheck today as she "knows it will be higher".  Still adamant about refusing more medication and insisting she can make diet/lifestyle changes.  She does consent to getting diabetic eye exam

## 2018-06-09 NOTE — Progress Notes (Signed)
    Subjective:  Frances Wilson is a 52 y.o. female who presents to the [facility] today with a chief complaint of DM checkup.   HPI: Has not been compliant at all with diet recommendations, was offered to check A1C and decided it wasn't going to be worth it because she knows it will be high.  We again discussed risks of uncontrolled DM and she has decided to make another attempt at diet/exercise changes.  She is currenlty on metformin/farxiga/trulicity and says she takes them as prescribed.  Has not had a diabetic eye exam but will go get one  Also reports she took all the prep for her colonoscopy, cleanout was determined to be insufficient and GI wants her to reschedule.  She is dissapointed but will do so.  Objective:  Physical Exam: BP 130/86   Pulse 90   Temp 98.2 F (36.8 C) (Oral)   Ht 5\' 7"  (1.702 m)   Wt 175 lb 9.6 oz (79.7 kg)   SpO2 99%   BMI 27.50 kg/m   Gen: NAD, resting comfortably CV: RRR with no murmurs appreciated Pulm: NWOB, CTAB with no crackles, wheezes, or rhonchi GI: Normal bowel sounds present. Soft, Nontender, Nondistended. MSK: no edema, cyanosis, or clubbing noted Skin: warm, dry Neuro: grossly normal, moves all extremities Psych: Normal affect and thought content  No results found for this or any previous visit (from the past 72 hour(s)).   Assessment/Plan:  Diabetes (HCC) Patient not controlled with A1C >8.  Declines recheck today as she "knows it will be higher".  Still adamant about refusing more medication and insisting she can make diet/lifestyle changes.  She does consent to getting diabetic eye exam  Screen for colon cancer Prep for prior colonoscopy was incomplete, patient to reschedule for repeat   Marthenia Rolling, DO FAMILY MEDICINE RESIDENT - PGY2 06/12/2018 11:30 AM

## 2018-06-12 DIAGNOSIS — Z1211 Encounter for screening for malignant neoplasm of colon: Secondary | ICD-10-CM | POA: Insufficient documentation

## 2018-06-12 NOTE — Assessment & Plan Note (Signed)
Prep for prior colonoscopy was incomplete, patient to reschedule for repeat

## 2018-09-02 ENCOUNTER — Ambulatory Visit: Payer: Managed Care, Other (non HMO) | Admitting: Family Medicine

## 2018-09-02 ENCOUNTER — Other Ambulatory Visit (HOSPITAL_COMMUNITY)
Admission: RE | Admit: 2018-09-02 | Discharge: 2018-09-02 | Disposition: A | Discharge status: Home / Self Care | Payer: Managed Care, Other (non HMO) | Source: Ambulatory Visit | Attending: Family Medicine | Admitting: Family Medicine

## 2018-09-02 VITALS — BP 125/80 | HR 100 | Temp 98.0°F | Wt 176.4 lb

## 2018-09-02 DIAGNOSIS — Z01419 Encounter for gynecological examination (general) (routine) without abnormal findings: Secondary | ICD-10-CM | POA: Diagnosis not present

## 2018-09-02 DIAGNOSIS — E119 Type 2 diabetes mellitus without complications: Secondary | ICD-10-CM

## 2018-09-02 DIAGNOSIS — N898 Other specified noninflammatory disorders of vagina: Secondary | ICD-10-CM | POA: Diagnosis not present

## 2018-09-02 LAB — POCT URINALYSIS DIP (MANUAL ENTRY)
BILIRUBIN UA: NEGATIVE
Glucose, UA: 500 mg/dL — AB
Leukocytes, UA: NEGATIVE
Nitrite, UA: NEGATIVE
Protein Ur, POC: NEGATIVE mg/dL
Spec Grav, UA: 1.02 (ref 1.010–1.025)
Urobilinogen, UA: 0.2 E.U./dL
pH, UA: 7 (ref 5.0–8.0)

## 2018-09-02 LAB — POCT WET PREP (WET MOUNT)
Clue Cells Wet Prep Whiff POC: NEGATIVE
Trichomonas Wet Prep HPF POC: ABSENT

## 2018-09-02 LAB — POCT GLYCOSYLATED HEMOGLOBIN (HGB A1C): HbA1c, POC (controlled diabetic range): 11.2 % — AB (ref 0.0–7.0)

## 2018-09-02 MED ORDER — METFORMIN HCL ER 500 MG PO TB24: 1000.0000 mg | ORAL_TABLET | Freq: Two times a day (BID) | ORAL | 3 refills | Status: DC

## 2018-09-02 MED ORDER — DULAGLUTIDE 1.5 MG/0.5ML ~~LOC~~ SOAJ: SUBCUTANEOUS | 3 refills | Status: DC

## 2018-09-02 NOTE — Progress Notes (Signed)
   [facility] Phone: [phone]   cc: vaginal itching/burning  Subjective:   Burning/itching: First happened in the summer, and then again in christmas/january.  She bought some yeast ifnection medication and it seemed to make it better.  It is not as bad now.  It was 'unbearable' before . Location of pain is 'on the outside' from area of pubic hair and surrounding vaginal orifice. Two days ago she noticed bleeding from her vaginal area when she wiped the area with toilet paper.  Has not had menstruation since she was 53 yo after partial hysterectomy.  No history of HSV.  DM: farxiga was causing burning, itching, bleeding on her vagina around.  Has been on farxiga for at least a year. Hasn't taken it in two weeks.  Hasn't taken trulicity since before christmas.  She is not taking it because pharmacy didn't approve her trulicity.  She takes metformin XR 500 BID.  Was on metformin once a day, and now twice a day.     ROS: See HPI for pertinent positives and negatives  Past Medical History  Family history reviewed for today's visit. No changes.   Objective: BP 125/80   Pulse 100   Temp 98 F (36.7 C)   Wt 176 lb 6.4 oz (80 kg)   SpO2 98%   BMI 27.63 kg/m  Gen: NAD, alert and oriented, cooperative with exam CV: normal rate, regular rhythm. No murmurs, no rubs.  Resp: LCTAB, no wheezes, crackles. normal work of breathing GI: nontender to palpation, BS present, no guarding or organomegaly GU: no lesions in perineal area.  No lesions on labia majora/minora. No cervical lesions. Small area , 0.5cm on right vaginal wall of erythema.  No bleeding noted. No abnormal odor. No cervical motion tenderness.  Skin: No rashes, no lesions Psych: Appropriate behavior  Assessment/Plan: Vaginal irritation - burning/itching pain on the skin surrounding her vagina over the past few weeks  - reports of intermittent bleeding on the past two days - GU exam appears normal except  for one small area on the left vaginal wall that could be a healed ulceration.  - DDx includes vaginal infection including STIs.  Unlikely caused by farxiga as patient believes. Will need to work up bleeding further if not explained by infection. Likely would refer to OB-GYN - pap performed. UA, Wet prep, GC tested  Diabetes (HCC) - patient not taking farxiga for > 2 weeks due to perceived SE - not taknig trluicity due to insurance issues.  - states her blood sugars have been > 300.  A1c performed today was 11.2  Was 8 four months ago  - resent prescription for trulicity.  Told patient to call us if she had difficulty with insurance coverage.  - holding farxiga until vaginal complaints can be further worked up - increased metformin XR to 1000mg  BID.     Frederic Jericho, MD PGY-1

## 2018-09-02 NOTE — Patient Instructions (Addendum)
It was nice to meet you today,   I don't believe your vaginal symptoms are caused by the farxiga.  They can cause UTI's sometimes so we checked your urine, but your symptoms don't sound like a UTI infection.  We can try the farxiga again if you would like, because it seemed to be working for your diabetes.  I also resent the prescription  For the trulicity. If your pharmacist still says it is not covered then you should ask them for the proper form you would need to cover the medication so that we can fill it out, otherwise I do not know which form you need.  I also increased your metformin to 1000mg  twice a day.  Please take it as it is directed on the bottle    As for your vaginal itching and burning, it is hard to know what is the caused based on your symptoms alone.  That is why I ordered a urinalysis and other tests for STI's or bacterial vaginosis.  I will let you know of the results if they are abnormal and we can prescribe the proper medication.    Please come back in one month for another diabetes follow up.

## 2018-09-03 DIAGNOSIS — N898 Other specified noninflammatory disorders of vagina: Secondary | ICD-10-CM | POA: Insufficient documentation

## 2018-09-03 DIAGNOSIS — IMO0002 Reserved for concepts with insufficient information to code with codable children: Secondary | ICD-10-CM | POA: Insufficient documentation

## 2018-09-03 DIAGNOSIS — E1165 Type 2 diabetes mellitus with hyperglycemia: Secondary | ICD-10-CM | POA: Insufficient documentation

## 2018-09-03 DIAGNOSIS — E119 Type 2 diabetes mellitus without complications: Secondary | ICD-10-CM

## 2018-09-03 NOTE — Assessment & Plan Note (Addendum)
-   burning/itching pain on the skin surrounding her vagina over the past few weeks  - reports of intermittent bleeding on the past two days - GU exam appears normal except for one small area on the left vaginal wall that could be a healed ulceration.  - DDx includes vaginal infection including STIs.  Unlikely caused by farxiga as patient believes. Will need to work up bleeding further if not explained by infection. Likely would refer to OB-GYN - pap performed. UA, Wet prep, GC tested

## 2018-09-03 NOTE — Assessment & Plan Note (Signed)
-   patient not taking farxiga for > 2 weeks due to perceived SE - not taknig trluicity due to insurance issues.  - states her blood sugars have been > 300.  A1c performed today was 11.2  Was 8 four months ago  - resent prescription for trulicity.  Told patient to call us if she had difficulty with insurance coverage.  - holding farxiga until vaginal complaints can be further worked up - increased metformin XR to 1000mg  BID.

## 2018-09-04 LAB — CYTOLOGY - PAP
Adequacy: ABSENT
Diagnosis: NEGATIVE
HPV: NOT DETECTED

## 2018-09-07 LAB — CERVICOVAGINAL ANCILLARY ONLY
Chlamydia: NEGATIVE
NEISSERIA GONORRHEA: NEGATIVE

## 2018-09-10 ENCOUNTER — Telehealth: Payer: Self-pay

## 2018-09-10 NOTE — Telephone Encounter (Signed)
Received fax from pharmacy, PA needed on Trulicity. Clinical questions submitted via Cover My Meds. Waiting on response, could take up to 72 hours.  Cover My Meds info: Key: [id number]  Ples Specter, RN [facility] RN)

## 2018-09-14 NOTE — Telephone Encounter (Signed)
Per Covermymeds "spoke with the pharmacy on 09/10/2018, and this request was created as a result of a refill to soon, They can pick up the medication after 09/23/2018 "  No further action needed from office at this time.  Quentina Fronek, Maryjo [place], CMA

## 2018-12-04 ENCOUNTER — Other Ambulatory Visit: Payer: Self-pay | Admitting: Family Medicine

## 2018-12-04 DIAGNOSIS — E119 Type 2 diabetes mellitus without complications: Secondary | ICD-10-CM

## 2018-12-23 ENCOUNTER — Encounter: Payer: Self-pay | Admitting: Gastroenterology

## 2019-01-19 ENCOUNTER — Other Ambulatory Visit: Payer: Self-pay | Admitting: Family Medicine

## 2019-01-19 DIAGNOSIS — E119 Type 2 diabetes mellitus without complications: Secondary | ICD-10-CM

## 2019-04-30 ENCOUNTER — Other Ambulatory Visit: Payer: Self-pay

## 2019-04-30 ENCOUNTER — Ambulatory Visit (INDEPENDENT_AMBULATORY_CARE_PROVIDER_SITE_OTHER): Payer: Managed Care, Other (non HMO) | Admitting: Family Medicine

## 2019-04-30 ENCOUNTER — Encounter: Payer: Self-pay | Admitting: Family Medicine

## 2019-04-30 VITALS — BP 118/84 | HR 82

## 2019-04-30 DIAGNOSIS — E119 Type 2 diabetes mellitus without complications: Secondary | ICD-10-CM

## 2019-04-30 DIAGNOSIS — B369 Superficial mycosis, unspecified: Secondary | ICD-10-CM | POA: Diagnosis not present

## 2019-04-30 LAB — POCT GLYCOSYLATED HEMOGLOBIN (HGB A1C): HbA1c, POC (controlled diabetic range): 14.7 % — AB (ref 0.0–7.0)

## 2019-04-30 LAB — POCT UA - MICROALBUMIN
Albumin/Creatinine Ratio, Urine, POC: >300
Creatinine, POC: 10 mg/dL
Microalbumin Ur, POC: 30 mg/L

## 2019-04-30 MED ORDER — ASPIRIN EC 81 MG PO TBEC: 81.0000 mg | DELAYED_RELEASE_TABLET | Freq: Every day | ORAL | 0 refills | Status: AC

## 2019-04-30 MED ORDER — CLOTRIMAZOLE 1 % EX CREA: 1.0000 "application " | TOPICAL_CREAM | Freq: Two times a day (BID) | CUTANEOUS | 0 refills | Status: DC

## 2019-04-30 MED ORDER — TRULICITY 1.5 MG/0.5ML ~~LOC~~ SOAJ: SUBCUTANEOUS | 3 refills | Status: DC

## 2019-04-30 MED ORDER — ADULT MULTIVITAMIN W/MINERALS CH: 1.0000 | ORAL_TABLET | Freq: Every day | ORAL | 0 refills | Status: AC

## 2019-04-30 MED ORDER — METFORMIN HCL ER 500 MG PO TB24: 1000.0000 mg | ORAL_TABLET | Freq: Two times a day (BID) | ORAL | 3 refills | Status: DC

## 2019-04-30 NOTE — Patient Instructions (Signed)
It was a pleasure to see you today! Thank you for choosing [facility] for your primary care. Frances Wilson was seen for skin irritation and diabetes. Come back to the clinic in a week or so.   Today we talked about the fact that we think your skin irritation is a fungal infection we have sent in a cream for you to use.  We also talked about the fact that your diabetes is uncontrolled since he stopped taking her medicine and it is extremely poor and your health to get started on again.  I will reorder this and had like you to see me again in approximately a week or so if I do not have an appointment next week it is fine to wait until you see me unless you start feeling that.  Please check your sugars daily and bring in the log from that.   Please bring all your medications to every doctors visit   Sign up for My Chart to have easy access to your labs results, and communication with your Primary care physician.     Please check-out at the front desk before leaving the clinic.     Best,  Dr. Sherene Sires FAMILY MEDICINE RESIDENT - PGY3 04/30/2019 4:02 PM

## 2019-05-01 LAB — BASIC METABOLIC PANEL
BUN/Creatinine Ratio: 14 (ref 9–23)
BUN: 12 mg/dL (ref 6–24)
CO2: 22 mmol/L (ref 20–29)
Calcium: 10.2 mg/dL (ref 8.7–10.2)
Chloride: 93 mmol/L — ABNORMAL LOW (ref 96–106)
Creatinine, Ser: 0.88 mg/dL (ref 0.57–1.00)
GFR calc Af Amer: 86 mL/min/{1.73_m2} (ref >59)
GFR calc non Af Amer: 75 mL/min/{1.73_m2} (ref >59)
Glucose: 312 mg/dL — ABNORMAL HIGH (ref 65–99)
Potassium: 4 mmol/L (ref 3.5–5.2)
Sodium: 134 mmol/L (ref 134–144)

## 2019-05-04 DIAGNOSIS — B369 Superficial mycosis, unspecified: Secondary | ICD-10-CM | POA: Insufficient documentation

## 2019-05-04 DIAGNOSIS — F332 Major depressive disorder, recurrent severe without psychotic features: Secondary | ICD-10-CM

## 2019-05-04 HISTORY — DX: Major depressive disorder, recurrent severe without psychotic features: F33.2

## 2019-05-04 HISTORY — DX: Superficial mycosis, unspecified: B36.9

## 2019-05-04 NOTE — Assessment & Plan Note (Signed)
Patient complaining with itching in her groin exterior to her vagina.  Denies discharge or dysuria.  Declines speculum exam.  External visualization of the skin folds in the groin show a likely superficial fungal infection.  Will prescribe topical for this.

## 2019-05-04 NOTE — Progress Notes (Signed)
    Subjective:  Frances Wilson is a 53 y.o. female who presents to the [facility] today with a chief complaint of diabetes medication refill.   HPI: Controlled type 2 diabetes mellitus without complication, without long-term current use of insulin ([place]) Patient comes in today to request refill of her diabetes medication that she stopped 2 months ago without any particular reason given, A1c is 14.7.   She denies DKA symptoms although says she does "I can tell my sugar is high".  Says she can afford the medication  Fungal skin infection Patient complaining with itching in her groin exterior to her vagina.  Denies discharge or dysuria.  Declines speculum exam.   Objective:  Physical Exam: BP 118/84   Pulse 82   SpO2 99%   Gen: NAD, conversing comfortably comfortably CV: RRR with no murmurs appreciated Pulm: NWOB, CTAB with no crackles, wheezes, or rhonchi GI: Normal bowel sounds present. Soft, Nontender, Nondistended. GU: GU exam performed entirely with CMA [name] in the room.  External visual exam only, in skin folds of groin there was skin breakdown consistent with fungal infection.  There were no obvious masses, lesions, bleeding, injuries. MSK: no edema, cyanosis, or clubbing noted Skin: warm, dry Neuro: grossly normal, moves all extremities Psych: Normal affect and thought content  No results found for this or any previous visit (from the past 72 hour(s)).   Assessment/Plan:  Controlled type 2 diabetes mellitus without complication, without long-term current use of insulin (Frances Wilson) Patient comes in today to request refill of her diabetes medication that she stopped 2 months ago without any particular reason given, A1c is 14.7.   She denies DKA symptoms although says she does "I can tell my sugar is high".  Patient refuses discussion about insulin, we will restart Trulicity and metformin.  She was prior on farxiga, but we will recheck her BMP allow her to restart his other medications  with a follow-up in approximately 1 week at which point we will likely start the [place] back to.  Patient instructed to check her blood glucose daily and told to hydrate thoroughly and start eating in a manner that reflects her diabetes.  Fungal skin infection Patient complaining with itching in her groin exterior to her vagina.  Denies discharge or dysuria.  Declines speculum exam.  External visualization of the skin folds in the groin show a likely superficial fungal infection.  Will prescribe topical for this.   Sherene Sires, DO FAMILY MEDICINE RESIDENT - PGY3 05/04/2019 8:47 AM

## 2019-05-04 NOTE — Assessment & Plan Note (Signed)
Patient comes in today to request refill of her diabetes medication that she stopped 2 months ago without any particular reason given, A1c is 14.7.   She denies DKA symptoms although says she does "I can tell my sugar is high".  Patient refuses discussion about insulin, we will restart Trulicity and metformin.  She was prior on farxiga, but we will recheck her BMP allow her to restart his other medications with a follow-up in approximately 1 week at which point we will likely start the [place] back to.  Patient instructed to check her blood glucose daily and told to hydrate thoroughly and start eating in a manner that reflects her diabetes.

## 2019-05-05 ENCOUNTER — Encounter (HOSPITAL_COMMUNITY): Payer: Self-pay | Admitting: Psychiatry

## 2019-05-05 ENCOUNTER — Ambulatory Visit (INDEPENDENT_AMBULATORY_CARE_PROVIDER_SITE_OTHER): Payer: 59 | Admitting: Psychiatry

## 2019-05-05 ENCOUNTER — Other Ambulatory Visit: Payer: Self-pay

## 2019-05-05 DIAGNOSIS — F331 Major depressive disorder, recurrent, moderate: Secondary | ICD-10-CM | POA: Diagnosis not present

## 2019-05-05 DIAGNOSIS — F4312 Post-traumatic stress disorder, chronic: Secondary | ICD-10-CM

## 2019-05-05 DIAGNOSIS — F901 Attention-deficit hyperactivity disorder, predominantly hyperactive type: Secondary | ICD-10-CM | POA: Diagnosis not present

## 2019-05-05 MED ORDER — TRAZODONE HCL 50 MG PO TABS: 50.0000 mg | ORAL_TABLET | Freq: Every evening | ORAL | 1 refills | Status: DC | PRN

## 2019-05-05 MED ORDER — METHYLPHENIDATE HCL ER (OSM) 27 MG PO TBCR: 27.0000 mg | EXTENDED_RELEASE_TABLET | Freq: Every day | ORAL | 0 refills | Status: DC

## 2019-05-05 MED ORDER — FLUOXETINE HCL 10 MG PO CAPS: ORAL_CAPSULE | ORAL | 1 refills | Status: DC
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
## 2019-05-10 ENCOUNTER — Other Ambulatory Visit: Payer: Self-pay

## 2019-05-10 ENCOUNTER — Encounter: Payer: Self-pay | Admitting: Family Medicine

## 2019-05-10 ENCOUNTER — Ambulatory Visit (INDEPENDENT_AMBULATORY_CARE_PROVIDER_SITE_OTHER): Payer: 59 | Admitting: Family Medicine

## 2019-05-10 DIAGNOSIS — E1165 Type 2 diabetes mellitus with hyperglycemia: Secondary | ICD-10-CM | POA: Diagnosis not present

## 2019-05-10 MED ORDER — FARXIGA 10 MG PO TABS: 10.0000 mg | ORAL_TABLET | Freq: Every day | ORAL | 0 refills | Status: DC

## 2019-05-12 ENCOUNTER — Telehealth: Payer: Self-pay | Admitting: *Deleted

## 2019-05-12 NOTE — Assessment & Plan Note (Addendum)
Patient had stopped all of her diabetes medications for approximately 2 months, has been back on her metformin and Trulicity for approximately last week.  She is checked her sugars twice which she says have now down to the mid 200s from the mid 300s.  We discussed that this is improvement but we still have significantly far to go.  Visual acuity is approximately 20/50 but patient has not been following with eye doctor so we are unsure how chronic this is.  Diabetes still uncontrolled but slightly improving, will add back her prior dose of farxiga, we discussed that her next visit we will likely add a statin because it does not appear that she has 1 and that is indicated for diabetic patients.  Patient instructed to get a diabetic eye exam

## 2019-05-12 NOTE — Telephone Encounter (Signed)
Contacted pt and she will call back at the first week or two in October to schedule this appointment. Frances [facility], CMA

## 2019-05-12 NOTE — Telephone Encounter (Signed)
-----   Message from Sherene Sires, DO sent at 05/12/2019  9:28 AM EDT ----- Patient should see either myself or Dr. Valentina Lucks sometime in October for further diabetes f/u.  Reason for epic "DM f/u, start statin?"

## 2019-05-12 NOTE — Progress Notes (Signed)
    Subjective:  Frances Wilson is a 53 y.o. female who presents to the [facility] today with a chief complaint of diabetes follow-up.   HPI: Uncontrolled diabetes mellitus ([place]) Patient had stopped all of her diabetes medications for approximately 2 months, has been back on her metformin and Trulicity for approximately last week.  She is checked her sugars twice which she says have now down to the mid 200s from the mid 300s.  We discussed that this is improvement but we still have significantly far to go.   Objective:  Physical Exam: BP 116/70   Pulse 82   Wt 177 lb (80.3 kg)   SpO2 100%   BMI 27.72 kg/m   Gen: NAD, conversing comfortably *Visual acuity 20/50, patient has been using reading glasses that she brought from the store so we are unsure how long her vision has been this way and if this is normal age or diabetes related, CV: RRR with no murmurs appreciated Pulm: NWOB, CTAB with no crackles, wheezes, or rhonchi MSK: no edema, cyanosis, or clubbing noted Skin: warm, dry Neuro: grossly normal, moves all extremities Psych: Normal affect and thought content  No results found for this or any previous visit (from the past 72 hour(s)).   Assessment/Plan:  Uncontrolled diabetes mellitus ([place]) Patient had stopped all of her diabetes medications for approximately 2 months, has been back on her metformin and Trulicity for approximately last week.  She is checked her sugars twice which she says have now down to the mid 200s from the mid 300s.  We discussed that this is improvement but we still have significantly far to go.  Visual acuity is approximately 20/50 but patient has not been following with eye doctor so we are unsure how chronic this is.  Diabetes still uncontrolled but slightly improving, will add back her prior dose of farxiga, we discussed that her next visit we will likely add a statin because it does not appear that she has 1 and that is indicated for diabetic patients.   Patient instructed to get a diabetic eye exam   Sherene Sires, [place] - PGY3 05/12/2019 9:25 AM

## 2019-06-01 ENCOUNTER — Encounter (HOSPITAL_COMMUNITY): Payer: Self-pay | Admitting: Psychiatry

## 2019-06-01 ENCOUNTER — Other Ambulatory Visit: Payer: Self-pay

## 2019-06-01 ENCOUNTER — Ambulatory Visit (INDEPENDENT_AMBULATORY_CARE_PROVIDER_SITE_OTHER): Payer: 59 | Admitting: Psychiatry

## 2019-06-01 DIAGNOSIS — F901 Attention-deficit hyperactivity disorder, predominantly hyperactive type: Secondary | ICD-10-CM | POA: Diagnosis not present

## 2019-06-01 DIAGNOSIS — F4312 Post-traumatic stress disorder, chronic: Secondary | ICD-10-CM

## 2019-06-01 DIAGNOSIS — F331 Major depressive disorder, recurrent, moderate: Secondary | ICD-10-CM | POA: Diagnosis not present

## 2019-06-01 MED ORDER — FLUOXETINE HCL 20 MG PO CAPS: ORAL_CAPSULE | ORAL | 1 refills | Status: DC

## 2019-06-01 MED ORDER — METHYLPHENIDATE HCL ER (OSM) 27 MG PO TBCR: 27.0000 mg | EXTENDED_RELEASE_TABLET | Freq: Every day | ORAL | 0 refills | Status: DC
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
## 2019-06-12 ENCOUNTER — Other Ambulatory Visit: Payer: Self-pay | Admitting: Family Medicine

## 2019-06-12 DIAGNOSIS — E1165 Type 2 diabetes mellitus with hyperglycemia: Secondary | ICD-10-CM

## 2019-06-21 ENCOUNTER — Other Ambulatory Visit: Payer: Self-pay

## 2019-06-21 DIAGNOSIS — Z20822 Contact with and (suspected) exposure to covid-19: Secondary | ICD-10-CM

## 2019-06-22 LAB — NOVEL CORONAVIRUS, NAA: SARS-CoV-2, NAA: NOT DETECTED

## 2019-07-14 ENCOUNTER — Other Ambulatory Visit: Payer: Self-pay

## 2019-07-14 ENCOUNTER — Encounter: Payer: Self-pay | Admitting: Family Medicine

## 2019-07-14 ENCOUNTER — Ambulatory Visit (INDEPENDENT_AMBULATORY_CARE_PROVIDER_SITE_OTHER): Payer: Self-pay | Admitting: Family Medicine

## 2019-07-14 VITALS — BP 122/72 | HR 94 | Wt 170.4 lb

## 2019-07-14 DIAGNOSIS — E1165 Type 2 diabetes mellitus with hyperglycemia: Secondary | ICD-10-CM

## 2019-07-14 DIAGNOSIS — Z1231 Encounter for screening mammogram for malignant neoplasm of breast: Secondary | ICD-10-CM

## 2019-07-14 MED ORDER — FARXIGA 10 MG PO TABS: 10.0000 mg | ORAL_TABLET | Freq: Every day | ORAL | 0 refills | Status: DC

## 2019-07-14 MED ORDER — TRULICITY 1.5 MG/0.5ML ~~LOC~~ SOAJ: SUBCUTANEOUS | 3 refills | Status: DC

## 2019-07-14 NOTE — Progress Notes (Signed)
    Subjective:  Frances Wilson is a 53 y.o. female who presents to the [facility] today with a chief complaint of diabetes follow-up.   HPI: Uncontrolled diabetes mellitus ([place]) Patient's last A1c was 2 months ago at 14.7 when she had stopped taking all of her medications.  She is now since on them and says her blood sugars are doing much better.  We will check A1c at the next visit   Objective:  Physical Exam: BP 122/72   Pulse 94   Wt 170 lb 6.4 oz (77.3 kg)   SpO2 98%   BMI 26.69 kg/m   Gen: NAD, conversing comfortably, pleasant CV: R regular rate Pulm: NWOB, no cough MSK: no edema, cyanosis, or clubbing noted Skin: warm, dry Neuro: grossly normal, moves all extremities Psych: Normal affect and thought content  No results found for this or any previous visit (from the past 72 hour(s)).   Assessment/Plan:  Uncontrolled diabetes mellitus ([place]) Patient's last A1c was 2 months ago at 14.7 when she had stopped taking all of her medications.  She is now since on them and says her blood sugars are doing much better.  We will check A1c at the next visit  Refill of diabetic medications no change at this time.  Encounter for screening mammogram for malignant neoplasm of breast No symptoms, overdue for mammogram.  Would like it scheduled and so we want to do that   Sherene Sires, [place] - PGY3 07/17/2019 8:21 PM

## 2019-07-17 DIAGNOSIS — Z1231 Encounter for screening mammogram for malignant neoplasm of breast: Secondary | ICD-10-CM | POA: Insufficient documentation

## 2019-07-17 NOTE — Assessment & Plan Note (Signed)
Patient's last A1c was 2 months ago at 14.7 when she had stopped taking all of her medications.  She is now since on them and says her blood sugars are doing much better.  We will check A1c at the next visit  Refill of diabetic medications no change at this time.

## 2019-07-17 NOTE — Assessment & Plan Note (Signed)
No symptoms, overdue for mammogram.  Would like it scheduled and so we want to do that

## 2019-08-02 ENCOUNTER — Other Ambulatory Visit: Payer: Self-pay

## 2019-08-02 ENCOUNTER — Ambulatory Visit (INDEPENDENT_AMBULATORY_CARE_PROVIDER_SITE_OTHER): Payer: 59 | Admitting: Psychiatry

## 2019-08-02 ENCOUNTER — Encounter (HOSPITAL_COMMUNITY): Payer: Self-pay | Admitting: Psychiatry

## 2019-08-02 DIAGNOSIS — F331 Major depressive disorder, recurrent, moderate: Secondary | ICD-10-CM | POA: Diagnosis not present

## 2019-08-02 DIAGNOSIS — F4312 Post-traumatic stress disorder, chronic: Secondary | ICD-10-CM | POA: Diagnosis not present

## 2019-08-02 DIAGNOSIS — F901 Attention-deficit hyperactivity disorder, predominantly hyperactive type: Secondary | ICD-10-CM | POA: Diagnosis not present

## 2019-08-02 DIAGNOSIS — F410 Panic disorder [episodic paroxysmal anxiety] without agoraphobia: Secondary | ICD-10-CM | POA: Diagnosis not present

## 2019-08-02 MED ORDER — METHYLPHENIDATE HCL ER (OSM) 27 MG PO TBCR: 27.0000 mg | EXTENDED_RELEASE_TABLET | Freq: Every day | ORAL | 0 refills | Status: DC

## 2019-08-02 MED ORDER — LORAZEPAM 0.5 MG PO TABS: 0.5000 mg | ORAL_TABLET | Freq: Every day | ORAL | 0 refills | Status: AC | PRN

## 2019-08-02 MED ORDER — FLUOXETINE HCL 20 MG PO CAPS: ORAL_CAPSULE | ORAL | 1 refills | Status: DC

## 2019-08-02 MED ORDER — TRAZODONE HCL 50 MG PO TABS: 50.0000 mg | ORAL_TABLET | Freq: Every evening | ORAL | 1 refills | Status: DC | PRN
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
## 2019-08-31 ENCOUNTER — Other Ambulatory Visit: Payer: Self-pay | Admitting: Family Medicine

## 2019-08-31 DIAGNOSIS — E1165 Type 2 diabetes mellitus with hyperglycemia: Secondary | ICD-10-CM

## 2019-09-01 MED ORDER — FARXIGA 10 MG PO TABS: 10.0000 mg | ORAL_TABLET | Freq: Every day | ORAL | 0 refills | Status: DC

## 2019-09-26 ENCOUNTER — Other Ambulatory Visit: Payer: Self-pay | Admitting: Family Medicine

## 2019-09-26 DIAGNOSIS — E1165 Type 2 diabetes mellitus with hyperglycemia: Secondary | ICD-10-CM

## 2019-10-16 ENCOUNTER — Ambulatory Visit: Payer: Managed Care, Other (non HMO) | Attending: Internal Medicine

## 2019-10-16 DIAGNOSIS — Z23 Encounter for immunization: Secondary | ICD-10-CM

## 2019-10-16 NOTE — Progress Notes (Signed)
   Covid-19 Vaccination Clinic  Name:  Frances Wilson    MRN: [id number] DOB: 1966/06/24  10/16/2019  [name] was observed post Covid-19 immunization for 15 minutes without incidence. She was provided with Vaccine Information Sheet and instruction to access the V-Safe system.   [name] was instructed to call 911 with any severe reactions post vaccine: Marland Kitchen Difficulty breathing  . Swelling of your face and throat  . A fast heartbeat  . A bad rash all over your body  . Dizziness and weakness    Immunizations Administered    Name Date Dose VIS Date Route   Pfizer COVID-19 Vaccine 10/16/2019 10:01 AM 0.3 mL 07/30/2019 Intramuscular   Manufacturer: [employer], [employer]   Lot: [id number]   NDC: [address]-2

## 2019-10-28 ENCOUNTER — Other Ambulatory Visit: Payer: Self-pay

## 2019-10-28 ENCOUNTER — Ambulatory Visit (INDEPENDENT_AMBULATORY_CARE_PROVIDER_SITE_OTHER): Payer: 59 | Admitting: Psychiatry

## 2019-10-28 ENCOUNTER — Encounter (HOSPITAL_COMMUNITY): Payer: Self-pay | Admitting: Psychiatry

## 2019-10-28 VITALS — Wt 160.0 lb

## 2019-10-28 DIAGNOSIS — F331 Major depressive disorder, recurrent, moderate: Secondary | ICD-10-CM

## 2019-10-28 DIAGNOSIS — F4312 Post-traumatic stress disorder, chronic: Secondary | ICD-10-CM | POA: Diagnosis not present

## 2019-10-28 DIAGNOSIS — F410 Panic disorder [episodic paroxysmal anxiety] without agoraphobia: Secondary | ICD-10-CM | POA: Diagnosis not present

## 2019-10-28 DIAGNOSIS — F339 Major depressive disorder, recurrent, unspecified: Secondary | ICD-10-CM | POA: Diagnosis not present

## 2019-10-28 DIAGNOSIS — F901 Attention-deficit hyperactivity disorder, predominantly hyperactive type: Secondary | ICD-10-CM

## 2019-10-28 DIAGNOSIS — F988 Other specified behavioral and emotional disorders with onset usually occurring in childhood and adolescence: Secondary | ICD-10-CM

## 2019-10-28 MED ORDER — FLUOXETINE HCL 20 MG PO CAPS: ORAL_CAPSULE | ORAL | 1 refills | Status: DC

## 2019-10-28 MED ORDER — METHYLPHENIDATE HCL ER (OSM) 27 MG PO TBCR: 27.0000 mg | EXTENDED_RELEASE_TABLET | Freq: Every day | ORAL | 0 refills | Status: DC
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
## 2019-10-30 ENCOUNTER — Other Ambulatory Visit: Payer: Self-pay | Admitting: Family Medicine

## 2019-10-30 DIAGNOSIS — E1165 Type 2 diabetes mellitus with hyperglycemia: Secondary | ICD-10-CM

## 2019-11-10 ENCOUNTER — Ambulatory Visit: Payer: Managed Care, Other (non HMO) | Attending: Internal Medicine

## 2019-11-10 DIAGNOSIS — Z23 Encounter for immunization: Secondary | ICD-10-CM

## 2019-11-10 NOTE — Progress Notes (Signed)
   Covid-19 Vaccination Clinic  Name:  Frances Wilson    MRN: [id number] DOB: 1965/10/24  11/10/2019  [name] was observed post Covid-19 immunization for 30 minutes based on pre-vaccination screening without incident. She was provided with Vaccine Information Sheet and instruction to access the V-Safe system.   [name] was instructed to call 911 with any severe reactions post vaccine: Marland Kitchen Difficulty breathing  . Swelling of face and throat  . A fast heartbeat  . A bad rash all over body  . Dizziness and weakness   Immunizations Administered    Name Date Dose VIS Date Route   Pfizer COVID-19 Vaccine 11/10/2019 12:25 PM 0.3 mL 07/30/2019 Intramuscular   Manufacturer: [employer], [employer]   Lot: [id number]   NDC: [address]-8

## 2019-12-05 ENCOUNTER — Other Ambulatory Visit: Payer: Self-pay | Admitting: Family Medicine

## 2019-12-05 DIAGNOSIS — E1165 Type 2 diabetes mellitus with hyperglycemia: Secondary | ICD-10-CM

## 2019-12-29 ENCOUNTER — Other Ambulatory Visit: Payer: Self-pay | Admitting: Family Medicine

## 2019-12-29 DIAGNOSIS — E119 Type 2 diabetes mellitus without complications: Secondary | ICD-10-CM

## 2020-01-09 ENCOUNTER — Other Ambulatory Visit: Payer: Self-pay | Admitting: Family Medicine

## 2020-01-09 DIAGNOSIS — E1165 Type 2 diabetes mellitus with hyperglycemia: Secondary | ICD-10-CM

## 2020-01-20 ENCOUNTER — Other Ambulatory Visit (HOSPITAL_COMMUNITY): Payer: Self-pay | Admitting: Psychiatry

## 2020-01-20 DIAGNOSIS — F4312 Post-traumatic stress disorder, chronic: Secondary | ICD-10-CM

## 2020-01-20 DIAGNOSIS — F331 Major depressive disorder, recurrent, moderate: Secondary | ICD-10-CM

## 2020-01-26 ENCOUNTER — Ambulatory Visit (HOSPITAL_COMMUNITY): Payer: 59 | Admitting: Psychiatry

## 2020-01-26 ENCOUNTER — Other Ambulatory Visit: Payer: Self-pay

## 2020-01-27 ENCOUNTER — Encounter (HOSPITAL_COMMUNITY): Payer: Self-pay | Admitting: Psychiatry

## 2020-01-27 ENCOUNTER — Other Ambulatory Visit: Payer: Self-pay

## 2020-01-27 ENCOUNTER — Telehealth (INDEPENDENT_AMBULATORY_CARE_PROVIDER_SITE_OTHER): Payer: 59 | Admitting: Psychiatry

## 2020-01-27 DIAGNOSIS — F901 Attention-deficit hyperactivity disorder, predominantly hyperactive type: Secondary | ICD-10-CM | POA: Diagnosis not present

## 2020-01-27 DIAGNOSIS — F4312 Post-traumatic stress disorder, chronic: Secondary | ICD-10-CM | POA: Diagnosis not present

## 2020-01-27 DIAGNOSIS — F331 Major depressive disorder, recurrent, moderate: Secondary | ICD-10-CM | POA: Diagnosis not present

## 2020-01-27 MED ORDER — FLUOXETINE HCL 20 MG PO CAPS: ORAL_CAPSULE | ORAL | 0 refills | Status: DC

## 2020-01-27 MED ORDER — METHYLPHENIDATE HCL ER (OSM) 27 MG PO TBCR: 27.0000 mg | EXTENDED_RELEASE_TABLET | Freq: Every day | ORAL | 0 refills | Status: DC
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
## 2020-02-08 ENCOUNTER — Other Ambulatory Visit: Payer: Self-pay | Admitting: Family Medicine

## 2020-02-08 DIAGNOSIS — E119 Type 2 diabetes mellitus without complications: Secondary | ICD-10-CM

## 2020-02-08 DIAGNOSIS — E1165 Type 2 diabetes mellitus with hyperglycemia: Secondary | ICD-10-CM

## 2020-03-21 ENCOUNTER — Other Ambulatory Visit: Payer: Self-pay | Admitting: *Deleted

## 2020-03-21 DIAGNOSIS — E1165 Type 2 diabetes mellitus with hyperglycemia: Secondary | ICD-10-CM

## 2020-03-21 MED ORDER — TRULICITY 1.5 MG/0.5ML ~~LOC~~ SOAJ: SUBCUTANEOUS | 0 refills | Status: DC

## 2020-03-21 MED ORDER — DAPAGLIFLOZIN PROPANEDIOL 10 MG PO TABS: 10.0000 mg | ORAL_TABLET | Freq: Every day | ORAL | 0 refills | Status: DC

## 2020-04-10 ENCOUNTER — Other Ambulatory Visit: Payer: Self-pay | Admitting: *Deleted

## 2020-04-10 DIAGNOSIS — E119 Type 2 diabetes mellitus without complications: Secondary | ICD-10-CM

## 2020-04-10 MED ORDER — METFORMIN HCL ER 500 MG PO TB24: 1000.0000 mg | ORAL_TABLET | Freq: Two times a day (BID) | ORAL | 0 refills | Status: DC

## 2020-04-27 ENCOUNTER — Other Ambulatory Visit: Payer: Self-pay | Admitting: Family Medicine

## 2020-04-27 ENCOUNTER — Other Ambulatory Visit: Payer: Self-pay

## 2020-04-27 ENCOUNTER — Encounter (HOSPITAL_COMMUNITY): Payer: Self-pay | Admitting: Psychiatry

## 2020-04-27 ENCOUNTER — Telehealth (INDEPENDENT_AMBULATORY_CARE_PROVIDER_SITE_OTHER): Payer: 59 | Admitting: Psychiatry

## 2020-04-27 DIAGNOSIS — F4312 Post-traumatic stress disorder, chronic: Secondary | ICD-10-CM | POA: Diagnosis not present

## 2020-04-27 DIAGNOSIS — F331 Major depressive disorder, recurrent, moderate: Secondary | ICD-10-CM | POA: Diagnosis not present

## 2020-04-27 DIAGNOSIS — E1165 Type 2 diabetes mellitus with hyperglycemia: Secondary | ICD-10-CM

## 2020-04-27 DIAGNOSIS — F901 Attention-deficit hyperactivity disorder, predominantly hyperactive type: Secondary | ICD-10-CM

## 2020-04-27 MED ORDER — METHYLPHENIDATE HCL ER (OSM) 36 MG PO TBCR: 36.0000 mg | EXTENDED_RELEASE_TABLET | Freq: Every day | ORAL | 0 refills | Status: DC

## 2020-04-27 MED ORDER — FLUOXETINE HCL 20 MG PO CAPS
ORAL_CAPSULE | ORAL | 0 refills | Status: DC
Start: 2020-04-27 — End: 2021-04-03
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
## 2020-06-05 ENCOUNTER — Other Ambulatory Visit: Payer: Self-pay | Admitting: Family Medicine

## 2020-06-05 DIAGNOSIS — E1165 Type 2 diabetes mellitus with hyperglycemia: Secondary | ICD-10-CM

## 2020-06-05 DIAGNOSIS — E119 Type 2 diabetes mellitus without complications: Secondary | ICD-10-CM

## 2020-07-14 ENCOUNTER — Other Ambulatory Visit: Payer: Self-pay | Admitting: Family Medicine

## 2020-07-14 DIAGNOSIS — E1165 Type 2 diabetes mellitus with hyperglycemia: Secondary | ICD-10-CM

## 2020-07-27 ENCOUNTER — Telehealth (HOSPITAL_COMMUNITY): Payer: 59 | Admitting: Psychiatry

## 2020-08-23 ENCOUNTER — Other Ambulatory Visit: Payer: Self-pay | Admitting: Family Medicine

## 2020-08-23 DIAGNOSIS — E119 Type 2 diabetes mellitus without complications: Secondary | ICD-10-CM

## 2020-08-23 DIAGNOSIS — E1165 Type 2 diabetes mellitus with hyperglycemia: Secondary | ICD-10-CM

## 2020-10-07 ENCOUNTER — Other Ambulatory Visit: Payer: Self-pay | Admitting: Family Medicine

## 2020-10-07 DIAGNOSIS — E1165 Type 2 diabetes mellitus with hyperglycemia: Secondary | ICD-10-CM

## 2020-10-08 ENCOUNTER — Other Ambulatory Visit: Payer: Self-pay | Admitting: Family Medicine

## 2020-10-08 DIAGNOSIS — E119 Type 2 diabetes mellitus without complications: Secondary | ICD-10-CM

## 2020-11-09 ENCOUNTER — Other Ambulatory Visit: Payer: Self-pay | Admitting: Family Medicine

## 2020-11-09 DIAGNOSIS — E1165 Type 2 diabetes mellitus with hyperglycemia: Secondary | ICD-10-CM

## 2020-12-26 ENCOUNTER — Other Ambulatory Visit: Payer: Self-pay | Admitting: Family Medicine

## 2020-12-26 DIAGNOSIS — E1165 Type 2 diabetes mellitus with hyperglycemia: Secondary | ICD-10-CM

## 2020-12-26 DIAGNOSIS — E119 Type 2 diabetes mellitus without complications: Secondary | ICD-10-CM

## 2021-01-01 ENCOUNTER — Encounter: Payer: Self-pay | Admitting: Family Medicine

## 2021-01-01 ENCOUNTER — Ambulatory Visit (INDEPENDENT_AMBULATORY_CARE_PROVIDER_SITE_OTHER): Payer: Managed Care, Other (non HMO) | Admitting: Family Medicine

## 2021-01-01 ENCOUNTER — Other Ambulatory Visit: Payer: Self-pay

## 2021-01-01 VITALS — BP 114/84 | HR 88 | Ht 67.0 in | Wt 176.0 lb

## 2021-01-01 DIAGNOSIS — I10 Essential (primary) hypertension: Secondary | ICD-10-CM

## 2021-01-01 DIAGNOSIS — Z124 Encounter for screening for malignant neoplasm of cervix: Secondary | ICD-10-CM | POA: Diagnosis not present

## 2021-01-01 DIAGNOSIS — E785 Hyperlipidemia, unspecified: Secondary | ICD-10-CM | POA: Diagnosis not present

## 2021-01-01 DIAGNOSIS — E1165 Type 2 diabetes mellitus with hyperglycemia: Secondary | ICD-10-CM | POA: Diagnosis not present

## 2021-01-01 LAB — POCT GLYCOSYLATED HEMOGLOBIN (HGB A1C): HbA1c, POC (controlled diabetic range): 9.2 % — AB (ref 0.0–7.0)

## 2021-01-01 NOTE — Patient Instructions (Signed)
It was great seeing you today.  I am going to collect some lab work including checking your hemoglobin A1c, your kidney function, your cholesterol.  We are also going to do a Pap smear but I discovered that you do not have a cervix and there is no need for further Pap smears.  If you have any questions or concerns please let us know.  Hope you have a wonderful afternoon!

## 2021-01-01 NOTE — Progress Notes (Signed)
    SUBJECTIVE:   CHIEF COMPLAINT / HPI:   Pap  Patient reports that she is here for a Pap smear.  She reports her last Pap smear was a few years ago and she thinks it is time for a repeat.  Most recent Pap smear was in 2020 and was normal.  Per further chart review patient has had total abdominal hysterectomy.  Patient denies any vaginal bleeding.  Diabetes  Previous hemoglobin A1c was 14.3.  Current medications include metformin 1000 mg twice daily, Farxiga 10 mg daily, Trulicity 1.5 mg weekly.  Patient reports most of her blood sugars have been in the low 200s with a few between 160 and 200.  Denies any hypoglycemic events.  Essential hypertension Patient reports he has history of hypertension but is not currently on any medications for the blood pressure control.  She has not had any abnormal headaches or issues with her blood pressure that she knows of. OBJECTIVE:   BP 114/84   Pulse 88   Ht 5\' 7"  (1.702 m)   Wt 176 lb (79.8 kg)   SpO2 98%   BMI 27.57 kg/m   General: Well-appearing 55 year old female, no acute distress Cardiac: Regular rate and rhythm, no murmurs appreciated Respiratory: Normal work of breathing, lungs clear to auscultation bilaterally Abdomen: Soft, nontender, positive bowel sounds GU: CMA present for exam, normal external vaginal tissue, normal internal vaginal tissue with no identifiable cervix.  ASSESSMENT/PLAN:   Hyperlipidemia ASCVD risk 16.8%.  High intensity statin recommended.  I have attempted to call patient to discuss this but she did not answer.  Will reattempt later and plan on prescribing Crestor 20 mg daily.  Essential hypertension, benign Hypertension currently well controlled at this time.  BMP collected at this visit.  Cervical cancer screening Patient with past medical history of abdominal hysterectomy.  Patient no longer has cervix and has had 1 negative Pap smear since her hysterectomy.  No need for further Pap smears.     57, MD [facility]

## 2021-01-02 LAB — BASIC METABOLIC PANEL
BUN/Creatinine Ratio: 14 (ref 9–23)
BUN: 12 mg/dL (ref 6–24)
CO2: 23 mmol/L (ref 20–29)
Calcium: 10.2 mg/dL (ref 8.7–10.2)
Chloride: 98 mmol/L (ref 96–106)
Creatinine, Ser: 0.84 mg/dL (ref 0.57–1.00)
Glucose: 144 mg/dL — ABNORMAL HIGH (ref 65–99)
Potassium: 3.9 mmol/L (ref 3.5–5.2)
Sodium: 141 mmol/L (ref 134–144)
eGFR: 82 mL/min/{1.73_m2} (ref >59)

## 2021-01-02 LAB — LIPID PANEL
Chol/HDL Ratio: 5.8 ratio — ABNORMAL HIGH (ref 0.0–4.4)
Cholesterol, Total: 286 mg/dL — ABNORMAL HIGH (ref 100–199)
HDL: 49 mg/dL (ref >39)
LDL Chol Calc (NIH): 184 mg/dL — ABNORMAL HIGH (ref 0–99)
Triglycerides: 272 mg/dL — ABNORMAL HIGH (ref 0–149)
VLDL Cholesterol Cal: 53 mg/dL — ABNORMAL HIGH (ref 5–40)

## 2021-01-03 DIAGNOSIS — Z124 Encounter for screening for malignant neoplasm of cervix: Secondary | ICD-10-CM | POA: Insufficient documentation

## 2021-01-03 NOTE — Assessment & Plan Note (Signed)
Hypertension currently well controlled at this time.  BMP collected at this visit.

## 2021-01-03 NOTE — Assessment & Plan Note (Signed)
ASCVD risk 16.8%.  High intensity statin recommended.  I have attempted to call patient to discuss this but she did not answer.  Will reattempt later and plan on prescribing Crestor 20 mg daily.

## 2021-01-03 NOTE — Assessment & Plan Note (Signed)
Patient with past medical history of abdominal hysterectomy.  Patient no longer has cervix and has had 1 negative Pap smear since her hysterectomy.  No need for further Pap smears.

## 2021-01-04 ENCOUNTER — Telehealth: Payer: Self-pay

## 2021-01-04 DIAGNOSIS — I10 Essential (primary) hypertension: Secondary | ICD-10-CM

## 2021-01-04 DIAGNOSIS — E785 Hyperlipidemia, unspecified: Secondary | ICD-10-CM

## 2021-01-04 MED ORDER — ROSUVASTATIN CALCIUM 20 MG PO TABS: 20.0000 mg | ORAL_TABLET | Freq: Every day | ORAL | 3 refills | Status: DC

## 2021-01-04 NOTE — Telephone Encounter (Signed)
Called patient regarding cholesterol.  ASCVD risk indicates she needs high intensity statin.  Patient reports she has taken statin in the past.  Sent prescription for Crestor to patient's pharmacy.  Patient is also requesting refill on methylphenidate but sees behavioral health for this.  We will discuss this at a future visit.  Patient has no further questions or concerns at this time.

## 2021-01-04 NOTE — Addendum Note (Signed)
Addended by: Celedonio Savage on: 01/04/2021 02:05 PM   Modules accepted: Orders

## 2021-01-04 NOTE — Telephone Encounter (Signed)
Reached out to patient regarding initiation of statin.  She has not had LFTs checked in 3 years so I have ordered a future order to check baseline LFTs as well as a baseline CK.  Patient will stop by the clinic to have those drawn.

## 2021-01-04 NOTE — Telephone Encounter (Signed)
Patient LVM on nurse line stating she was returning phone call to provider.   Please advise if you were trying to reach this patient. Best contact number is [phone].  Veronda Prude, RN

## 2021-01-05 ENCOUNTER — Other Ambulatory Visit: Payer: Managed Care, Other (non HMO)

## 2021-01-05 ENCOUNTER — Other Ambulatory Visit: Payer: Self-pay

## 2021-01-05 DIAGNOSIS — I10 Essential (primary) hypertension: Secondary | ICD-10-CM

## 2021-01-05 DIAGNOSIS — E785 Hyperlipidemia, unspecified: Secondary | ICD-10-CM

## 2021-01-06 LAB — COMPREHENSIVE METABOLIC PANEL
ALT: 12 IU/L (ref 0–32)
AST: 10 IU/L (ref 0–40)
Albumin/Globulin Ratio: 1.3 (ref 1.2–2.2)
Albumin: 4.2 g/dL (ref 3.8–4.9)
Alkaline Phosphatase: 97 IU/L (ref 44–121)
BUN/Creatinine Ratio: 13 (ref 9–23)
BUN: 9 mg/dL (ref 6–24)
Bilirubin Total: 0.4 mg/dL (ref 0.0–1.2)
CO2: 24 mmol/L (ref 20–29)
Calcium: 10.5 mg/dL — ABNORMAL HIGH (ref 8.7–10.2)
Chloride: 97 mmol/L (ref 96–106)
Creatinine, Ser: 0.71 mg/dL (ref 0.57–1.00)
Globulin, Total: 3.2 g/dL (ref 1.5–4.5)
Glucose: 143 mg/dL — ABNORMAL HIGH (ref 65–99)
Potassium: 4.4 mmol/L (ref 3.5–5.2)
Sodium: 138 mmol/L (ref 134–144)
Total Protein: 7.4 g/dL (ref 6.0–8.5)
eGFR: 100 mL/min/{1.73_m2} (ref >59)

## 2021-01-06 LAB — CK: Total CK: 84 U/L (ref 32–182)

## 2021-01-26 ENCOUNTER — Telehealth (HOSPITAL_COMMUNITY): Payer: Self-pay | Admitting: Family Medicine

## 2021-01-26 DIAGNOSIS — E1165 Type 2 diabetes mellitus with hyperglycemia: Secondary | ICD-10-CM

## 2021-01-26 MED ORDER — TRULICITY 1.5 MG/0.5ML ~~LOC~~ SOAJ: SUBCUTANEOUS | 0 refills | Status: DC

## 2021-01-26 NOTE — Telephone Encounter (Signed)
-----   Message from [place], [facility] sent at 01/26/2021  1:31 PM EDT ----- Hi Dr. Nobie Putnam,  I scheduled this patient for the 22, thanks!  She mentioned on the phone she needs a refill of Trulicity. Could you send in one box of 1.5 with no refills because I think I am going to titrate her up.  Thanks! Rachelle ----- Message ----- From: Derrel Nip, MD Sent: 01/19/2021   9:26 AM EDT To: Cheral Almas, RPH  Could you please reach out to her and schedule this.  I think she would benefit from it.  Thank you! ----- Message ----- From: Cheral Almas, [facility] Sent: 01/08/2021  10:49 AM EDT To: Derrel Nip, MD  Hello Dr. Nobie Putnam,   The Pharmacy team is starting an initiative to assess patients in the clinic with uncontrolled diabetes and a recent A1C of greater than or equal to 9%. I identified this patient that you saw in clinic on 01/01/21 with an A1C of 9.2%. This is a dramatic improvement from her previous A1C of 14%, and this patient appears to meet the criteria for pharmacy intervention to help with further A1C reduction.   Current diabetes medications include: metformin 1000 mg twice daily, Farxiga 10 mg daily, Trulicity 1.5 mg weekly.  If you are agreeable to including the pharmacy team I will reach out and schedule an initial diabetes visit with the patient.  Thanks! Cheral Almas, PharmD

## 2021-02-07 ENCOUNTER — Ambulatory Visit: Payer: Managed Care, Other (non HMO) | Admitting: Pharmacist

## 2021-02-08 ENCOUNTER — Other Ambulatory Visit: Payer: Self-pay

## 2021-02-08 ENCOUNTER — Ambulatory Visit: Payer: Managed Care, Other (non HMO) | Admitting: Pharmacist

## 2021-02-08 DIAGNOSIS — E1165 Type 2 diabetes mellitus with hyperglycemia: Secondary | ICD-10-CM | POA: Diagnosis not present

## 2021-02-08 DIAGNOSIS — E119 Type 2 diabetes mellitus without complications: Secondary | ICD-10-CM | POA: Diagnosis not present

## 2021-02-08 MED ORDER — METFORMIN HCL ER 500 MG PO TB24: 1000.0000 mg | ORAL_TABLET | Freq: Two times a day (BID) | ORAL | 11 refills | Status: DC

## 2021-02-08 MED ORDER — DAPAGLIFLOZIN PROPANEDIOL 10 MG PO TABS: 10.0000 mg | ORAL_TABLET | Freq: Every day | ORAL | 2 refills | Status: DC

## 2021-02-08 MED ORDER — TRULICITY 3 MG/0.5ML ~~LOC~~ SOAJ: 3.0000 mg | SUBCUTANEOUS | 0 refills | Status: DC

## 2021-02-08 NOTE — Patient Instructions (Signed)
It was a pleasure seeing you today.   Please do the following:  Increase Trulicity to 3 mg injection into the tissue of the abdomen once weekly as directed today during your appointment. If you have any questions or if you believe something has occurred because of this change, call me or your doctor to let one of [place] know.  Continue checking blood sugars at home. It's really important that you record these and bring these in to your next doctor's appointment.  Continue making the lifestyle changes we've discussed together during our visit. Diet and exercise play a significant role in improving your blood sugars.  Follow-up with me in 1 month.    Hypoglycemia or low blood sugar:   Low blood sugar can happen quickly and may become an emergency if not treated right away.   While this shouldn't happen often, it can be brought upon if you skip a meal or do not eat enough. Also, if your insulin or other diabetes medications are dosed too high, this can cause your blood sugar to go to low.   Warning signs of low blood sugar include: Feeling shaky or dizzy Feeling weak or tired  Excessive hunger Feeling anxious or upset  Sweating even when you aren't exercising  What to do if I experience low blood sugar? Follow the Rule of 15 Check your blood sugar with your meter. If lower than 70, proceed to step 2.  Treat with 15 grams of fast acting carbs which is found in 3-4 glucose tablets. If none are available you can try hard candy, 1 tablespoon of sugar or honey,4 ounces of fruit juice, or 6 ounces of REGULAR soda.  Re-check your sugar in 15 minutes. If it is still below 70, do what you did in step 2 again. If your blood sugar has come back up, go ahead and eat a snack or small meal made up of complex carbs (ex. Whole grains) and protein at this time to avoid recurrence of low blood sugar.

## 2021-02-08 NOTE — Progress Notes (Signed)
Subjective:    Patient ID: Frances Wilson, female    DOB: 19-May-1966, 55 y.o.   MRN: [id number]  HPI Patient is a 55 y.o. female who presents for diabetes management. She is in good spirits and presents without assistance. Patient was referred and last seen by Primary Care Provider on 01/01/21.  Patient reports diabetes was diagnosed in 2020.   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   Patient states that she is taking her diabetes medications as prescribed. Patient reports adherence with medications.  Does you feel that your medications are working for you?  no  Have you been experiencing any side effects to the medications prescribed? no  Do you have any problems obtaining medications due to transportation or finances?  no     Patient reported dietary habits:  Eats 2-3 meals/day and snacks throughout day   Breakfast: flavored oatmeal with fruits (strawberries, blueberries, raisins), nuts; eggs with cheese; sometimes bacon and hashbrowns or grits Lunch: sometimes fast food; sometimes piece of fruit and small bag of chips Dinner: protein + vegetable (spinach, green beans, cabbage, corn (rarely), asparagus, salads) Snacks: peanuts + raisins, peanut butter crackers, fruit Drinks: water, carbonated water, diet (rarely)  Patient-reported exercise habits: has gym membership but has not gone.   Patient denies hypoglycemic events. Patient denies polyuria (increased urination).  Patient denies polyphagia (increased appetite).  Patient denies polydipsia (increased thirst).  Patient reports neuropathy (nerve pain). Patient endorses pins and needles in one foot on and off. Patient denies visual changes. Patient reports self foot exams.    Patient denies taking fasting blood sugars 2 hour post-meal/random blood sugars: does not take often but generally low 200's  Objective:   Physical Exam Vitals reviewed.  Constitutional:      Appearance: Normal appearance. She is normal weight.  Neurological:     Mental Status: She is oriented to person, place, and time.  Psychiatric:        Mood and Affect: Mood normal.        Behavior: Behavior normal.        Thought Content: Thought content normal.        Judgment: Judgment normal.    Review of Systems  Neurological:  Positive for tingling (pins and needles sensation occurs ocassionally in left foot only).  All other systems reviewed and are negative.  Lab Results  Component Value Date   HGBA1C 9.2 (A) 01/01/2021   HGBA1C 14.7 (A) 04/30/2019   HGBA1C 11.2 (A) 09/02/2018   Lab Results  Component Value Date   MICRALBCREAT >300 04/30/2019    Lipid Panel     Component Value Date/Time   CHOL 286 (H) 01/01/2021 1654   TRIG 272 (H) 01/01/2021 1654   HDL 49 01/01/2021 1654   CHOLHDL 5.8 (H) 01/01/2021 1654   CHOLHDL 6.8 (H) 10/09/2016 1529   VLDL 38 (H) 10/09/2016 1529   LDLCALC 184 (H) 01/01/2021 1654    Clinical Atherosclerotic Cardiovascular Disease (ASCVD): No  The 10-year ASCVD risk score 01/03/2021 DC Jr., et al., 2013) is: 8.8%   Values used to calculate the score:     Age: 71 years     Sex: Female     Is Non-Hispanic African American: Yes     Diabetic: Yes     Tobacco smoker: No  Systolic Blood Pressure: 114 mmHg     Is BP treated: No     HDL Cholesterol: 49 mg/dL     Total Cholesterol: 286 mg/dL   PHQ-9 Score: 3  Assessment/Plan:   T2DM is not controlled likely due to diet and need for medication optimization. Medication adherence appears optimal. Additional pharmacotherapy is not indicated at this time. Benefits of medication include improved blood sugar, prevention of kidney disease and cardiovascular disease. Patient educated on purpose, proper  use and potential adverse effects of Trulicity.  Following instruction patient verbalized understanding of treatment plan.    Increased dose of GLP-1 Trulicity (generic name dulaglutide) to 3mg  [place] once weekly.  Continued SGLT2-I Farxiga (generic name dapagliflozin) 10 mg daily. Extensively discussed pathophysiology of diabetes, dietary effects on blood sugar control, and recommended lifestyle interventions Patient will adhere to dietary modifications Patient will exercise with goal to increase towards target of at least 150 minutes of moderate intensity exercise weekly. Encouraged patient to go to gym once weekly. Counseled on s/sx of and management of hypoglycemia Next A1C anticipated August 2022.   Follow-up appointment 4 weeks to review sugar readings. Written patient instructions provided.  This appointment required 45 minutes of patient care (this includes precharting, chart review, review of results, and face-to-face care).  Thank you for involving pharmacy to assist in providing this patient's care.  Patient seen with September 2022, PharmD - PGY-1 Resident.

## 2021-02-13 ENCOUNTER — Telehealth: Payer: Self-pay

## 2021-02-13 NOTE — Telephone Encounter (Signed)
Received fax from pharmacy, PA needed on Trulicity 3 mg/0.5 ml.  Clinical questions submitted via Cover My Meds.  Waiting on response, could take up to 72 hours.  Cover My Meds info: Key: [id number]   Veronda Prude, RN

## 2021-03-13 ENCOUNTER — Other Ambulatory Visit: Payer: Self-pay

## 2021-03-13 ENCOUNTER — Ambulatory Visit: Payer: Managed Care, Other (non HMO) | Admitting: Pharmacist

## 2021-03-13 DIAGNOSIS — E1165 Type 2 diabetes mellitus with hyperglycemia: Secondary | ICD-10-CM

## 2021-03-13 MED ORDER — TRULICITY 4.5 MG/0.5ML ~~LOC~~ SOAJ: 4.5000 mg | SUBCUTANEOUS | 0 refills | Status: DC

## 2021-03-13 NOTE — Assessment & Plan Note (Signed)
T2DM is not controlled but improving due to patient making lifestyle changes as well as optimization of current medications. Medication adherence appears optimal. Will titrate up Trulicity to max dose of 4.5mg  once weekly as patient tolerated 4 doses with no side effects. Patient educated on purpose, proper use and potential adverse effects of Trulicity.  Following instruction patient verbalized understanding of treatment plan.    1. Increased dose of GLP-1 Trulicity (generic name dulaglutide) to 4.5mg  [place] once weekly on Sundays 2. Continued SGLT2-I Farxiga (generic name dapagliflozin) 10 mg daily. 3. Extensively discussed pathophysiology of diabetes, dietary effects on blood sugar control, and recommended lifestyle interventions 4. Patient will adhere to dietary modifications 5. Patient will exercise with goal to increase towards target of at least 150 minutes of moderate intensity exercise weekly. Encouraged patient to go to gym once weekly. 6. Counseled on s/sx of and management of hypoglycemia 7. Next A1C anticipated August 2022.

## 2021-03-13 NOTE — Progress Notes (Deleted)
 Subjective:    Patient ID: Frances Wilson, female    DOB: 09/28/1965, 55 y.o.   MRN: [id number]  HPI Patient is a 55 y.o. female who presents for diabetes management. She is in good spirits and presents without assistance. Patient was referred and last seen by Primary Care Provider on 01/01/21.  Patient reports diabetes was diagnosed in 2020.   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   Patient states that she is taking her diabetes medications as prescribed. Patient reports adherence with medications.  Does you feel that your medications are working for you?  no  Have you been experiencing any side effects to the medications prescribed? no  Do you have any problems obtaining medications due to transportation or finances?  no     Patient reported dietary habits:  Eats 2-3 meals/day and snacks throughout day   Breakfast: flavored oatmeal with fruits (strawberries, blueberries, raisins), nuts; eggs with cheese; sometimes bacon and hashbrowns or grits Lunch: sometimes fast food; sometimes piece of fruit and small bag of chips Dinner: protein + vegetable (spinach, green beans, cabbage, corn (rarely), asparagus, salads) Snacks: peanuts + raisins, peanut butter crackers, fruit Drinks: water, carbonated water, diet Mountain Dew (rarely)  Patient-reported exercise habits: has gym membership but has not gone.   Patient denies hypoglycemic events. Patient denies polyuria (increased urination).  Patient denies polyphagia (increased appetite).  Patient denies polydipsia (increased thirst).  Patient reports neuropathy (nerve pain). Patient endorses pins and needles in one foot on and off. Patient denies visual changes. Patient reports self foot exams.    Patient denies taking fasting blood sugars 2 hour post-meal/random blood sugars: does not take often but generally low 200's  Objective:   Physical Exam Vitals reviewed.  Constitutional:      Appearance: Normal appearance. She is normal weight.  Neurological:     Mental Status: She is oriented to person, place, and time.  Psychiatric:        Mood and Affect: Mood normal.        Behavior: Behavior normal.        Thought Content: Thought content normal.        Judgment: Judgment normal.    Review of Systems  Neurological:  Positive for tingling (pins and needles sensation occurs ocassionally in left foot only).  All other systems reviewed and are negative.  Lab Results  Component Value Date   HGBA1C 9.2 (A) 01/01/2021   HGBA1C 14.7 (A) 04/30/2019   HGBA1C 11.2 (A) 09/02/2018   Lab Results  Component Value Date   MICRALBCREAT >300 04/30/2019    Lipid Panel     Component Value Date/Time   CHOL 286 (H) 01/01/2021 1654   TRIG 272 (H) 01/01/2021 1654   HDL 49 01/01/2021 1654   CHOLHDL 5.8 (H) 01/01/2021 1654   CHOLHDL 6.8 (H) 10/09/2016 1529   VLDL 38 (H) 10/09/2016 1529   LDLCALC 184 (H) 01/01/2021 1654    Clinical Atherosclerotic Cardiovascular Disease (ASCVD): No  The 10-year ASCVD risk score (Goff DC Jr., et al., 2013) is: 8.8%   Values used to calculate the score:     Age: 55 years     Sex: Female     Is Non-Hispanic African American: Yes     Diabetic: Yes     Tobacco smoker: No       Systolic Blood Pressure: 114 mmHg     Is BP treated: No     HDL Cholesterol: 49 mg/dL     Total Cholesterol: 286 mg/dL   PHQ-9 Score: 3  Assessment/Plan:   T2DM is not controlled likely due to diet and need for medication optimization. Medication adherence appears optimal. Additional pharmacotherapy is not indicated at this time. Benefits of medication include improved blood sugar, prevention of kidney disease and cardiovascular disease. Patient educated on purpose, proper  use and potential adverse effects of Trulicity.  Following instruction patient verbalized understanding of treatment plan.    Increased dose of GLP-1 Trulicity (generic name dulaglutide) to 3mg  [place] once weekly.  Continued SGLT2-I Farxiga (generic name dapagliflozin) 10 mg daily. Extensively discussed pathophysiology of diabetes, dietary effects on blood sugar control, and recommended lifestyle interventions Patient will adhere to dietary modifications Patient will exercise with goal to increase towards target of at least 150 minutes of moderate intensity exercise weekly. Encouraged patient to go to gym once weekly. Counseled on s/sx of and management of hypoglycemia Next A1C anticipated August 2022.   Follow-up appointment 4 weeks to review sugar readings. Written patient instructions provided.  This appointment required 45 minutes of patient care (this includes precharting, chart review, review of results, and face-to-face care).  Thank you for involving pharmacy to assist in providing this patient's care.  Patient seen with September 2022, PharmD - PGY-1 Resident.

## 2021-03-13 NOTE — Patient Instructions (Addendum)
Ms. Goers it was a pleasure seeing you today.   Please do the following:  Increase Trulicity to 4.5mg  once weekly as directed today during your appointment. If you have any questions or if you believe something has occurred because of this change, call me or your doctor to let one of [place] know.  Continue checking blood sugars at home. It's really important that you record these and bring these in to your next doctor's appointment.  Continue making the lifestyle changes we've discussed together during our visit. Diet and exercise play a significant role in improving your blood sugars.  Follow-up with me in four weeks.   Hypoglycemia or low blood sugar:   Low blood sugar can happen quickly and may become an emergency if not treated right away.   While this shouldn't happen often, it can be brought upon if you skip a meal or do not eat enough. Also, if your insulin or other diabetes medications are dosed too high, this can cause your blood sugar to go to low.   Warning signs of low blood sugar include: Feeling shaky or dizzy Feeling weak or tired  Excessive hunger Feeling anxious or upset  Sweating even when you aren't exercising  What to do if I experience low blood sugar? Follow the Rule of 15 Check your blood sugar with your meter. If lower than 70, proceed to step 2.  Treat with 15 grams of fast acting carbs which is found in 3-4 glucose tablets. If none are available you can try hard candy, 1 tablespoon of sugar or honey,4 ounces of fruit juice, or 6 ounces of REGULAR soda.  Re-check your sugar in 15 minutes. If it is still below 70, do what you did in step 2 again. If your blood sugar has come back up, go ahead and eat a snack or small meal made up of complex carbs (ex. Whole grains) and protein at this time to avoid recurrence of low blood sugar.

## 2021-03-13 NOTE — Progress Notes (Signed)
Subjective:    Patient ID: Frances Wilson, female    DOB: [birth year], 55 y.o.   MRN: [id number]  HPI Patient is a 55 y.o. female who presents for diabetes management. She is in good spirits and presents without assistance. Patient was referred and last seen by Primary Care Provider on 01/01/21. Last seen in pharmacy clinic 02/08/21.  Patient reports tolerating Trulicity 3mg  well and denies any side effects.  Patient reports diabetes was diagnosed in 2020.   Insurance coverage/medication affordability: 2021)  Social history: former smoker (27 pack-years, quit 2001); occasional alcohol use; denies drug use  Current diabetes medications include: metformin 1000 mg twice daily, Farxiga 10mg  daily, Trulicity 3mg  (completed 4 doses)  Patient states that she is taking her diabetes medications as prescribed. Patient reports adherence with medications.  Does you feel that your medications are working for you?  no  Have you been experiencing any side effects to the medications prescribed? no  Do you have any problems obtaining medications due to transportation or finances?  no     Patient reported dietary habits:  Eats 2-3 meals/day and snacks throughout day   Breakfast: flavored oatmeal with fruits (strawberries, blueberries, raisins), nuts; eggs with cheese; sometimes bacon and hashbrowns or grits Lunch: sometimes fast food; sometimes piece of fruit and small bag of chips Dinner: protein + vegetable (spinach, green beans, cabbage, corn (rarely), asparagus, salads) Snacks: peanuts + raisins, peanut butter crackers, fruit Drinks: water, carbonated water, diet 2002 (rarely)  Patient-reported exercise habits: Has been participating in a group aerobics class as well as yoga 2/week  Patient denies hypoglycemic events. Patient denies polyuria (increased urination).  Patient denies polyphagia (increased appetite).  Patient denies polydipsia (increased thirst).  Patient  denies neuropathy (nerve pain).  Patient denies visual changes. Patient reports self foot exams.   Fasting blood glucose: 150's   Objective:   Physical Exam Vitals reviewed.  Constitutional:      Appearance: Normal appearance. She is normal weight.  Neurological:     Mental Status: She is oriented to person, place, and time.  Psychiatric:        Mood and Affect: Mood normal.        Behavior: Behavior normal.        Thought Content: Thought content normal.        Judgment: Judgment normal.   Review of Systems  Gastrointestinal:  Negative for abdominal pain, nausea and vomiting.    Lab Results  Component Value Date   HGBA1C 9.2 (A) 01/01/2021   HGBA1C 14.7 (A) 04/30/2019   HGBA1C 11.2 (A) 09/02/2018   Lab Results  Component Value Date   MICRALBCREAT >300 04/30/2019    Lipid Panel     Component Value Date/Time   CHOL 286 (H) 01/01/2021 1654   TRIG 272 (H) 01/01/2021 1654   HDL 49 01/01/2021 1654   CHOLHDL 5.8 (H) 01/01/2021 1654   CHOLHDL 6.8 (H) 10/09/2016 1529   VLDL 38 (H) 10/09/2016 1529   LDLCALC 184 (H) 01/01/2021 1654    Clinical Atherosclerotic Cardiovascular Disease (ASCVD): No  The 10-year ASCVD risk score 10/11/2016 DC Jr., et al., 2013) is: 8.8%   Values used to calculate the score:     Age: 32 years     Sex: Female     Is Non-Hispanic African American: Yes     Diabetic: Yes     Tobacco smoker: No     Systolic Blood Pressure: 114 mmHg     Is BP treated:  No     HDL Cholesterol: 49 mg/dL     Total Cholesterol: 286 mg/dL   Assessment/Plan:   Y6RS is not controlled but improving due to patient making lifestyle changes as well as optimization of current medications. Medication adherence appears optimal. Will titrate up Trulicity to max dose of 4.5mg  once weekly as patient tolerated 4 doses with no side effects. Patient educated on purpose, proper use and potential adverse effects of Trulicity.  Following instruction patient verbalized understanding of  treatment plan.    Increased dose of GLP-1 Trulicity (generic name dulaglutide) to 4.5mg  [place] once weekly on Sundays Continued SGLT2-I Farxiga (generic name dapagliflozin) 10 mg daily. Extensively discussed pathophysiology of diabetes, dietary effects on blood sugar control, and recommended lifestyle interventions Patient will adhere to dietary modifications Patient will exercise with goal to increase towards target of at least 150 minutes of moderate intensity exercise weekly. Encouraged patient to go to gym once weekly. Counseled on s/sx of and management of hypoglycemia Next A1C anticipated August 2022.   Follow-up appointment 4 weeks to review sugar readings. Written patient instructions provided.  This appointment required 30 minutes of direct patient care.  Thank you for involving pharmacy to assist in providing this patient's care.

## 2021-03-15 ENCOUNTER — Telehealth: Payer: Self-pay

## 2021-03-15 NOTE — Telephone Encounter (Signed)
Received fax from pharmacy, PA needed on [employer]. Clinical questions submitted via Cover My Meds.   Cover My Meds info: Key: [id number]  Medication approved through 03/15/2022 for 25 dollar copay. The pharmacy has been updated.

## 2021-03-23 LAB — HM DIABETES EYE EXAM

## 2021-04-03 ENCOUNTER — Other Ambulatory Visit: Payer: Self-pay | Admitting: *Deleted

## 2021-04-03 DIAGNOSIS — F331 Major depressive disorder, recurrent, moderate: Secondary | ICD-10-CM

## 2021-04-03 DIAGNOSIS — F4312 Post-traumatic stress disorder, chronic: Secondary | ICD-10-CM

## 2021-04-03 MED ORDER — TRAZODONE HCL 50 MG PO TABS: 50.0000 mg | ORAL_TABLET | Freq: Every evening | ORAL | 1 refills | Status: DC | PRN

## 2021-04-03 MED ORDER — FLUOXETINE HCL 20 MG PO CAPS: ORAL_CAPSULE | ORAL | 0 refills | Status: DC

## 2021-04-03 NOTE — Telephone Encounter (Signed)
Patient requesting refills on her depression meds.  She was paying a $60 copay for telephone visits with her therapists and was unable to keep paying that.  Will forward to MD.  Burnard Hawthorne

## 2021-04-16 ENCOUNTER — Ambulatory Visit: Payer: Managed Care, Other (non HMO) | Admitting: Pharmacist

## 2021-04-16 ENCOUNTER — Other Ambulatory Visit: Payer: Self-pay

## 2021-04-16 DIAGNOSIS — E1165 Type 2 diabetes mellitus with hyperglycemia: Secondary | ICD-10-CM

## 2021-04-16 LAB — POCT GLYCOSYLATED HEMOGLOBIN (HGB A1C): HbA1c, POC (controlled diabetic range): 8.2 % — AB (ref 0.0–7.0)

## 2021-04-16 MED ORDER — TRULICITY 4.5 MG/0.5ML ~~LOC~~ SOAJ: 4.5000 mg | SUBCUTANEOUS | 0 refills | Status: DC

## 2021-04-16 NOTE — Patient Instructions (Addendum)
[name] it was a pleasure seeing you today.   Please do the following:  Continue your medications as directed today during your appointment. If you have any questions or if you believe something has occurred because of this change, call me or your doctor to let one of [place] know.  Continue checking blood sugars at home. It's really important that you record these and bring these in to your next doctor's appointment.  Continue making the lifestyle changes we've discussed together during our visit. Diet and exercise play a significant role in improving your blood sugars.  Follow-up with provider in one month.  Hypoglycemia or low blood sugar:   Low blood sugar can happen quickly and may become an emergency if not treated right away.   While this shouldn't happen often, it can be brought upon if you skip a meal or do not eat enough. Also, if your insulin or other diabetes medications are dosed too high, this can cause your blood sugar to go to low.   Warning signs of low blood sugar include: Feeling shaky or dizzy Feeling weak or tired  Excessive hunger Feeling anxious or upset  Sweating even when you aren't exercising  What to do if I experience low blood sugar? Follow the Rule of 15 Check your blood sugar with your meter. If lower than 70, proceed to step 2.  Treat with 15 grams of fast acting carbs which is found in 3-4 glucose tablets. If none are available you can try hard candy, 1 tablespoon of sugar or honey,4 ounces of fruit juice, or 6 ounces of REGULAR soda.  Re-check your sugar in 15 minutes. If it is still below 70, do what you did in step 2 again. If your blood sugar has come back up, go ahead and eat a snack or small meal made up of complex carbs (ex. Whole grains) and protein at this time to avoid recurrence of low blood sugar.

## 2021-04-16 NOTE — Assessment & Plan Note (Signed)
  T2DM is not controlled but improving based on A1C in clinic today down from 9.2 to 8.2%. Medication adherence appears optimal. Discussed with patient that making lifestyle changes will likely bring patient down to A1C goal of <7% without needing additional therapy. Patient will work towards making lifestyle modifications.  Following instruction patient verbalized understanding of treatment plan.    1. Continued GLP-1 Trulicity (generic name dulaglutide) to 4.5mg   once weekly on Sundays 2. Continued SGLT2-I Farxiga (generic name dapagliflozin) 10 mg daily. 3. Continued metformin 1000mg  BID 4. Extensively discussed pathophysiology of diabetes, dietary effects on blood sugar control, and recommended lifestyle interventions. 5. Patient will adhere to dietary modifications 6. Patient will exercise with goal to increase towards target of at least 150 minutes of moderate intensity exercise weekly. Encouraged patient to go to gym once weekly. 7. Counseled on s/sx of and management of hypoglycemia 8. Next A1C anticipated November 2022 9. Obtain lipid panel at next PCP visit  Follow-up appointment 4 weeks with provider to review sugar readings. Written patient instructions provided.

## 2021-04-16 NOTE — Progress Notes (Signed)
Subjective:    Patient ID: Svea Pusch, female    DOB: 10/28/1965, 55 y.o.   MRN: [id number]  HPI Patient is a 55 y.o. female who presents for diabetes management. She is in good spirits and presents without assistance. Patient was referred and last seen by Primary Care Provider on 01/01/21. Last seen in pharmacy clinic 03/13/21.  Patient reports sugars have been "weird, like all over the place." Patient reports some nausea with increased dose of Trulicity 4.5mg  but reports that it is tolerable and would like to continue on dose.  Patient reports diabetes was diagnosed in 2020.   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 Patient reports adherence with medications.  Does you feel that your medications are working for you?  no  Have you been experiencing any side effects to the medications prescribed? Yes; some mild nausea with Trulicity  Do you have any problems obtaining medications due to transportation or finances?  no     Patient reported dietary habits:  Eats 2-3 meals/day and snacks throughout day   Breakfast: flavored oatmeal with fruits (strawberries, blueberries, raisins), nuts; eggs with cheese; sometimes bacon and hashbrowns or grits Lunch: sometimes fast food; sometimes piece of fruit and small bag of chips Dinner: protein + vegetable (spinach, green beans, cabbage, corn (rarely), asparagus, salads) Snacks: peanuts + raisins, peanut butter crackers, fruit Drinks: water, carbonated water, diet (rarely)  Patient-reported exercise habits: Previously was participating in a group aerobics class as well as yoga 2/week but reports since being back at work  she has not been exercising  Patient denies hypoglycemic events. Patient denies polyuria (increased urination).  Patient denies polyphagia (increased appetite).  Patient denies polydipsia (increased thirst).  Patient denies neuropathy (nerve pain).  Patient denies visual changes. Patient reports self foot exams.   Fasting blood glucose: 138-160  Objective:   Physical Exam Vitals reviewed.  Constitutional:      Appearance: Normal appearance. She is normal weight.  Neurological:     Mental Status: She is oriented to person, place, and time.  Psychiatric:        Mood and Affect: Mood normal.        Behavior: Behavior normal.        Thought Content: Thought content normal.        Judgment: Judgment normal.   Review of Systems  Gastrointestinal:  Positive for nausea. Negative for abdominal pain and vomiting.    Lab Results  Component Value Date   HGBA1C 8.2 (A) 04/16/2021   HGBA1C 9.2 (A) 01/01/2021   HGBA1C 14.7 (A) 04/30/2019   Lab Results  Component Value Date   MICRALBCREAT >300 04/30/2019    Lipid Panel     Component Value Date/Time   CHOL 286 (H) 01/01/2021 1654   TRIG 272 (H) 01/01/2021 1654   HDL 49 01/01/2021 1654   CHOLHDL 5.8 (H) 01/01/2021 1654   CHOLHDL 6.8 (H) 10/09/2016 1529   VLDL 38 (H) 10/09/2016 1529   LDLCALC 184 (H) 01/01/2021 1654    Clinical Atherosclerotic Cardiovascular Disease (ASCVD): No  The 10-year ASCVD risk score 01/03/2021 DC Jr., et al., 2013) is: 8.8%   Values used to calculate the score:     Age: 41 years  Sex: Female     Is Non-Hispanic African American: Yes     Diabetic: Yes     Tobacco smoker: No     Systolic Blood Pressure: 114 mmHg     Is BP treated: No     HDL Cholesterol: 49 mg/dL     Total Cholesterol: 286 mg/dL   Assessment/Plan:   F3LK is not controlled but improving based on A1C in clinic today down from 9.2 to 8.2%. Medication adherence appears optimal. Discussed with patient that making lifestyle changes will  likely bring patient down to A1C goal of <7% without needing additional therapy. Patient will work towards making lifestyle modifications.  Following instruction patient verbalized understanding of treatment plan.    Continued GLP-1 Trulicity (generic name dulaglutide) to 4.5mg  [place] once weekly on Sundays Continued SGLT2-I Farxiga (generic name dapagliflozin) 10 mg daily. Continued metformin 1000mg  BID Extensively discussed pathophysiology of diabetes, dietary effects on blood sugar control, and recommended lifestyle interventions. Patient will adhere to dietary modifications Patient will exercise with goal to increase towards target of at least 150 minutes of moderate intensity exercise weekly. Encouraged patient to go to gym once weekly. Counseled on s/sx of and management of hypoglycemia Next A1C anticipated November 2022 Obtain lipid panel at next PCP visit  Follow-up appointment 4 weeks with provider to review sugar readings. Written patient instructions provided.  This appointment required 20 minutes of direct patient care.  Thank you for involving pharmacy to assist in providing this patient's care.

## 2021-05-04 ENCOUNTER — Other Ambulatory Visit (HOSPITAL_COMMUNITY): Payer: Self-pay

## 2021-05-12 ENCOUNTER — Other Ambulatory Visit: Payer: Self-pay | Admitting: Family Medicine

## 2021-05-12 DIAGNOSIS — E1165 Type 2 diabetes mellitus with hyperglycemia: Secondary | ICD-10-CM

## 2021-05-14 ENCOUNTER — Other Ambulatory Visit: Payer: Self-pay | Admitting: Family Medicine

## 2021-05-14 DIAGNOSIS — E1165 Type 2 diabetes mellitus with hyperglycemia: Secondary | ICD-10-CM

## 2021-05-15 ENCOUNTER — Ambulatory Visit: Payer: Managed Care, Other (non HMO) | Admitting: Family Medicine

## 2021-05-15 ENCOUNTER — Other Ambulatory Visit: Payer: Self-pay

## 2021-05-15 ENCOUNTER — Encounter: Payer: Self-pay | Admitting: Family Medicine

## 2021-05-15 VITALS — BP 141/80 | HR 72 | Ht 67.0 in | Wt 162.0 lb

## 2021-05-15 DIAGNOSIS — F4312 Post-traumatic stress disorder, chronic: Secondary | ICD-10-CM

## 2021-05-15 DIAGNOSIS — E1165 Type 2 diabetes mellitus with hyperglycemia: Secondary | ICD-10-CM

## 2021-05-15 DIAGNOSIS — Z23 Encounter for immunization: Secondary | ICD-10-CM | POA: Diagnosis not present

## 2021-05-15 DIAGNOSIS — I10 Essential (primary) hypertension: Secondary | ICD-10-CM

## 2021-05-15 DIAGNOSIS — F32A Depression, unspecified: Secondary | ICD-10-CM

## 2021-05-15 DIAGNOSIS — E785 Hyperlipidemia, unspecified: Secondary | ICD-10-CM

## 2021-05-15 DIAGNOSIS — F331 Major depressive disorder, recurrent, moderate: Secondary | ICD-10-CM

## 2021-05-15 DIAGNOSIS — F419 Anxiety disorder, unspecified: Secondary | ICD-10-CM

## 2021-05-15 MED ORDER — FLUOXETINE HCL 40 MG PO CAPS: ORAL_CAPSULE | ORAL | 3 refills | Status: DC

## 2021-05-15 NOTE — Assessment & Plan Note (Signed)
Patient currently on Crestor 20 mg daily.  Lipid panel collected today and will adjust as needed.

## 2021-05-15 NOTE — Progress Notes (Signed)
    SUBJECTIVE:   CHIEF COMPLAINT / HPI:   Diabetes  Patient reports that her blood sugars have been doing well and she has no concerns regarding her blood pressure medications.  Reports that she is currently taking her metformin 1000 mg twice daily as well as [place] and Trulicity.  Denies any signs or symptoms of hypoglycemia.  Depression  Patient reports that her depression has not been well controlled over the past few weeks.  She reports that over 2 weeks ago she did have an episode of severe depression where she felt like she would be better off not around.  She denies any plan for self-harm or thoughts of self-harm in general.  She called her sister and her best friend and they or her support system and she is doing much better now.  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  Reports she has been compliant with her Prozac 20 mg daily and that it helps.  Is interested in increasing the dose.  Denies any SI or HI today.  OBJECTIVE:   BP (!) 141/80   Pulse 72   Ht 5\' 7"  (1.702 m)   Wt 162 lb (73.5 kg)   SpO2 100%   BMI 25.37 kg/m   General: Well-appearing 55 year old female, no acute distress Cardiac: Regular rate and rhythm, no murmurs appreciated Respiratory: Normal breathing, lungs clear to station bilaterally MSK: No gross abnormalities Psych: Upbeat attitude today, does get soft-spoken when talking about the depression.  No tears.  Denies SI or HI  ASSESSMENT/PLAN:   Uncontrolled diabetes mellitus (HCC) Patient reports good compliance with her Trulicity, Farxiga, metformin.  She reports that she has been unable to exercise since starting school back because she has been so busy.  She has continued her weight loss.  She is going to work on exercise daily and continue her current medication regimen.  We will see the patient again in November for A1c check.  Lipid panel ordered today.  Hyperlipidemia Patient currently on Crestor 20  mg daily.  Lipid panel collected today and will adjust as needed.  Anxiety and depression Patient reports worsening depression.  Is currently on Prozac 20 mg daily.  Discussed options and have increased dosage to 40 mg daily.  We will follow-up in 1 month to discuss depression symptoms.  Strict ED and return precautions given.     December, MD [facility]

## 2021-05-15 NOTE — Assessment & Plan Note (Signed)
Patient reports worsening depression.  Is currently on Prozac 20 mg daily.  Discussed options and have increased dosage to 40 mg daily.  We will follow-up in 1 month to discuss depression symptoms.  Strict ED and return precautions given.

## 2021-05-15 NOTE — Patient Instructions (Signed)
It was great seeing you today!  Congratulations on the weight loss.  Normal to continue on your current medication regimen and we will recheck a hemoglobin A1c in November.  I have increased your Prozac to 40 mg daily and I would like to follow-up with you in about a month regarding your depression.  I know you are already on a cholesterol medication but I want to check your cholesterol today.  If I need to make any adjustments or there are any abnormalities I will give you a call.  If not I will send you a MyChart message.  If you have any questions or concerns call the clinic.  Hope you have a wonderful afternoon!

## 2021-05-15 NOTE — Assessment & Plan Note (Signed)
Patient reports good compliance with her Trulicity, Marcelline Deist, metformin.  She reports that she has been unable to exercise since starting school back because she has been so busy.  She has continued her weight loss.  She is going to work on exercise daily and continue her current medication regimen.  We will see the patient again in November for A1c check.  Lipid panel ordered today.

## 2021-05-16 LAB — LIPID PANEL
Chol/HDL Ratio: 3.5 ratio (ref 0.0–4.4)
Cholesterol, Total: 132 mg/dL (ref 100–199)
HDL: 38 mg/dL — ABNORMAL LOW (ref >39)
LDL Chol Calc (NIH): 70 mg/dL (ref 0–99)
Triglycerides: 138 mg/dL (ref 0–149)
VLDL Cholesterol Cal: 24 mg/dL (ref 5–40)

## 2021-06-12 ENCOUNTER — Other Ambulatory Visit: Payer: Self-pay | Admitting: Family Medicine

## 2021-06-12 DIAGNOSIS — E1165 Type 2 diabetes mellitus with hyperglycemia: Secondary | ICD-10-CM

## 2021-06-19 ENCOUNTER — Other Ambulatory Visit: Payer: Self-pay | Admitting: Family Medicine

## 2021-06-19 DIAGNOSIS — E1165 Type 2 diabetes mellitus with hyperglycemia: Secondary | ICD-10-CM

## 2021-07-01 ENCOUNTER — Other Ambulatory Visit: Payer: Self-pay

## 2021-07-01 ENCOUNTER — Ambulatory Visit (HOSPITAL_COMMUNITY)
Admission: EM | Admit: 2021-07-01 | Discharge: 2021-07-01 | Disposition: A | Discharge status: Home / Self Care | Payer: Managed Care, Other (non HMO) | Attending: Family Medicine | Admitting: Family Medicine

## 2021-07-01 ENCOUNTER — Encounter (HOSPITAL_COMMUNITY): Payer: Self-pay | Admitting: Emergency Medicine

## 2021-07-01 DIAGNOSIS — N3001 Acute cystitis with hematuria: Secondary | ICD-10-CM

## 2021-07-01 DIAGNOSIS — K921 Melena: Secondary | ICD-10-CM

## 2021-07-01 LAB — CBC WITH DIFFERENTIAL/PLATELET
Abs Immature Granulocytes: 0.03 10*3/uL (ref 0.00–0.07)
Basophils Absolute: 0.1 10*3/uL (ref 0.0–0.1)
Basophils Relative: 0 %
Eosinophils Absolute: 0.2 10*3/uL (ref 0.0–0.5)
Eosinophils Relative: 2 %
HCT: 44.8 % (ref 36.0–46.0)
Hemoglobin: 14.6 g/dL (ref 12.0–15.0)
Immature Granulocytes: 0 %
Lymphocytes Relative: 26 %
Lymphs Abs: 3.5 10*3/uL (ref 0.7–4.0)
MCH: 26.9 pg (ref 26.0–34.0)
MCHC: 32.6 g/dL (ref 30.0–36.0)
MCV: 82.5 fL (ref 80.0–100.0)
Monocytes Absolute: 1 10*3/uL (ref 0.1–1.0)
Monocytes Relative: 8 %
Neutro Abs: 8.5 10*3/uL — ABNORMAL HIGH (ref 1.7–7.7)
Neutrophils Relative %: 64 %
Platelets: 370 10*3/uL (ref 150–400)
RBC: 5.43 MIL/uL — ABNORMAL HIGH (ref 3.87–5.11)
RDW: 13.6 % (ref 11.5–15.5)
WBC: 13.3 10*3/uL — ABNORMAL HIGH (ref 4.0–10.5)
nRBC: 0 % (ref 0.0–0.2)

## 2021-07-01 LAB — POCT URINALYSIS DIPSTICK, ED / UC
Glucose, UA: >=1000 mg/dL — AB
Ketones, ur: 40 mg/dL — AB
Nitrite: NEGATIVE
Protein, ur: 100 mg/dL — AB
Specific Gravity, Urine: >=1.03 (ref 1.005–1.030)
Urobilinogen, UA: 1 mg/dL (ref 0.0–1.0)
pH: 5 (ref 5.0–8.0)

## 2021-07-01 LAB — CBG MONITORING, ED: Glucose-Capillary: 138 mg/dL — ABNORMAL HIGH (ref 70–99)

## 2021-07-01 MED ORDER — NITROFURANTOIN MONOHYD MACRO 100 MG PO CAPS: 100.0000 mg | ORAL_CAPSULE | Freq: Two times a day (BID) | ORAL | 0 refills | Status: DC

## 2021-07-01 NOTE — ED Triage Notes (Signed)
Pt reports loose stools with bright red blood that started this morning. Having abd cramping. Also having burps "taste like toxic waste".  Denies taking blood blood thinners.  Reports that Trulicity makes her have diarrhea.

## 2021-07-01 NOTE — Discharge Instructions (Signed)
Start nitrofurantoin twice daily for treatment of what I suspect is a urinary tract infection.  I have placed a referral for further work-up and evaluation of the blood in stool. Let our GI will contact you to schedule your appointment. We will notify you if any of your blood work is abnormal.  If any of the bleeding worsens or you develop any severe abdominal pain go immediately to the emergency room for further work-up and evaluation.

## 2021-07-01 NOTE — ED Provider Notes (Signed)
[facility]    CSN: [id number] Arrival date & time: 07/01/21  1019      History   Chief Complaint Chief Complaint  Patient presents with   Rectal Bleeding    HPI Jovon Streetman is a 55 y.o. female.   HPI  Patient with history of uncontrolled diabetes, hypertension, presents today with nausea, vomiting, belching, loose stools with the presence of bright red streaks.  Reports that she suffers from chronic diarrhea and nausea with vomiting following administration of her Trulicity which she takes for management of her diabetes.  Patient reports she is never seen blood in her stool prior to this morning. Denies any specific abdominal pain.  Has abdominal cramping at baseline with prior to bowel movements.  Patient had a unremarkable colonoscopy in 2019 and is currently not due for another colon screening.   Past Medical History:  Diagnosis Date   Anxiety    [facility]   Attention deficit disorder (ADD)    onset age 36; [facility]   Depression    Diabetes mellitus without complication (HCC) 08/19/2009   Hypertension     Patient Active Problem List   Diagnosis Date Noted   Cervical cancer screening 01/03/2021   Encounter for screening mammogram for malignant neoplasm of breast 07/17/2019   Severe episode of recurrent major depressive disorder, without psychotic features (HCC) 05/04/2019   Fungal skin infection 05/04/2019   Uncontrolled diabetes mellitus 09/03/2018   Vaginal irritation 09/03/2018   Screen for colon cancer 06/12/2018   Lung nodule seen on imaging study 04/15/2018   Acute hemorrhagic colitis 04/14/2018   Chronic post-traumatic stress disorder (PTSD) 11/14/2016   Hyperlipidemia 01/22/2016   Anxiety and depression 11/29/2014   Essential hypertension, benign 11/29/2014   Goiter 11/29/2014   ADHD (attention deficit hyperactivity disorder) 09/23/2014    Past Surgical History:  Procedure Laterality Date   ABDOMINAL HYSTERECTOMY      ovaries intact; no cancer.   LEG SURGERY     L hip fracture s/p ORIF.     OB History   No obstetric history on file.      Home Medications    Prior to Admission medications   Medication Sig Start Date End Date Taking? Authorizing Provider  nitrofurantoin, macrocrystal-monohydrate, (MACROBID) 100 MG capsule Take 1 capsule (100 mg total) by mouth 2 (two) times daily. 07/01/21  Yes Bing Neighbors, FNP  aspirin EC 81 MG tablet Take 1 tablet (81 mg total) by mouth daily. 04/30/19   Marthenia Rolling, DO  FARXIGA 10 MG TABS tablet Take 1 tablet by mouth once daily 06/12/21   Derrel Nip, MD  FLUoxetine (PROZAC) 40 MG capsule Take 1 capsule daily. 05/15/21   Derrel Nip, MD  metFORMIN (GLUCOPHAGE-XR) 500 MG 24 hr tablet Take 2 tablets (1,000 mg total) by mouth 2 (two) times daily. 02/08/21   Derrel Nip, MD  methylphenidate (CONCERTA) 36 MG PO CR tablet Take 1 tablet (36 mg total) by mouth daily. Patient not taking: No sig reported 04/27/20 04/27/21  Arfeen, Phillips Grout, MD  rosuvastatin (CRESTOR) 20 MG tablet Take 1 tablet (20 mg total) by mouth daily. 01/04/21   Derrel Nip, MD  traZODone (DESYREL) 50 MG tablet Take 1 tablet (50 mg total) by mouth at bedtime as needed for sleep. 04/03/21   Derrel Nip, MD  TRULICITY 4.5 MG/0.5ML SOPN INJECT 4.5 MG SUBCUTANEOUSLY ONCE A WEEK 06/20/21   Derrel Nip, MD    Family History Family History  Problem Relation Age of Onset  Alcohol abuse Mother    Thyroid disease Mother    Heart disease Sister        AMI secondary to substance abuse   Pancreatic cancer Maternal Grandfather    Stomach cancer Other        GREAT AUNT   Colon cancer Neg Hx     Social History Social History   Tobacco Use   Smoking status: Former    Packs/day: 1.50    Years: 18.00    Pack years: 27.00    Types: Cigarettes    Start date: 08/19/1981    Quit date: 12/18/1999    Years since quitting: 21.5   Smokeless tobacco: Never  Vaping Use   Vaping Use:  Never used  Substance Use Topics   Alcohol use: Yes    Alcohol/week: 0.0 standard drinks    Comment: Occasional use    Drug use: No     Allergies   Maxipime [cefepime]   Review of Systems Review of Systems Pertinent negatives listed in HPI   Physical Exam Triage Vital Signs ED Triage Vitals  Enc Vitals Group     BP 07/01/21 1142 (!) 155/92     Pulse Rate 07/01/21 1142 93     Resp 07/01/21 1142 18     Temp 07/01/21 1142 99.2 F (37.3 C)     Temp Source 07/01/21 1142 Oral     SpO2 07/01/21 1142 98 %     Weight --      Height --      Head Circumference --      Peak Flow --      Pain Score 07/01/21 1140 4     Pain Loc --      Pain Edu? --      Excl. in [place]? --    No data found.  Updated Vital Signs BP (!) 155/92 (BP Location: Left Arm)   Pulse 93   Temp 99.2 F (37.3 C) (Oral)   Resp 18   SpO2 98%   Visual Acuity Right Eye Distance:   Left Eye Distance:   Bilateral Distance:    Right Eye Near:   Left Eye Near:    Bilateral Near:     Physical Exam HENT:     Head: Normocephalic.  Cardiovascular:     Rate and Rhythm: Normal rate and regular rhythm.  Pulmonary:     Effort: Pulmonary effort is normal.     Breath sounds: Normal breath sounds.  Abdominal:     General: Abdomen is flat. Bowel sounds are normal. There is no distension.     Tenderness: There is no abdominal tenderness. There is no right CVA tenderness, left CVA tenderness or guarding.  Genitourinary:    Rectum: Normal. Guaiac result negative.     Comments: No visible hemorrhoids or rectal fissures.  No visible active bleeding noted on exam Musculoskeletal:        General: Normal range of motion.  Skin:    General: Skin is warm and dry.     Capillary Refill: Capillary refill takes less than 2 seconds.  Neurological:     General: No focal deficit present.     Mental Status: She is alert and oriented to person, place, and time.  Psychiatric:        Mood and Affect: Mood normal.         Thought Content: Thought content normal.     UC Treatments / Results  Labs (all labs ordered are listed, but only  abnormal results are displayed) Labs Reviewed  CBC WITH DIFFERENTIAL/PLATELET - Abnormal; Notable for the following components:      Result Value   WBC 13.3 (*)    RBC 5.43 (*)    Neutro Abs 8.5 (*)    All other components within normal limits  CBG MONITORING, ED - Abnormal; Notable for the following components:   Glucose-Capillary 138 (*)    All other components within normal limits  POCT URINALYSIS DIPSTICK, ED / UC - Abnormal; Notable for the following components:   Glucose, UA >=1000 (*)    Bilirubin Urine MODERATE (*)    Ketones, ur 40 (*)    Hgb urine dipstick MODERATE (*)    Protein, ur 100 (*)    Leukocytes,Ua TRACE (*)    All other components within normal limits  URINE CULTURE  POC OCCULT BLOOD, ED    EKG   Radiology No results found.  Procedures Procedures (including critical care time)  Medications Ordered in UC Medications - No data to display  Initial Impression / Assessment and Plan / UC Course  I have reviewed the triage vital signs and the nursing notes.  Pertinent labs & imaging results that were available during my care of the patient were reviewed by me and considered in my medical decision making (see chart for details).    Patient presents today with acute onset bright red blood in stool with bowel movement this morning. Guaiac stool card negative.  UA is significant for moderate hematuria (patient had a hysterectomy), trace leukocytes, protein, and glucose which is highly concerning for that of a urinary tract infection.  Given patient's history of uncontrolled diabetes and her report of dark urine I suspect bleeding is likely coming from urethra however patient is insistent that blood was in the actual stool therefore I am placing a referral to GI and treating for urinary tract infection.  Urine culture pending.  Patient advised that  if any of her symptoms worsen or the bleeding becomes severe or accompanied by severe abdominal pain to go immediately to the emergency department.  CBC pending to ensure that hemoglobin is stable. Follow-up with PCP as needed.  Return precautions as needed. Final Clinical Impressions(s) / UC Diagnoses   Final diagnoses:  Acute cystitis with hematuria  Blood in stool     Discharge Instructions      Start nitrofurantoin twice daily for treatment of what I suspect is a urinary tract infection.  I have placed a referral for further work-up and evaluation of the blood in stool. Let our GI will contact you to schedule your appointment. We will notify you if any of your blood work is abnormal.  If any of the bleeding worsens or you develop any severe abdominal pain go immediately to the emergency room for further work-up and evaluation.     ED Prescriptions     Medication Sig Dispense Auth. Provider   nitrofurantoin, macrocrystal-monohydrate, (MACROBID) 100 MG capsule Take 1 capsule (100 mg total) by mouth 2 (two) times daily. 20 capsule Scot Jun, FNP      PDMP not reviewed this encounter.   Scot Jun, FNP 07/01/21 1320

## 2021-07-02 LAB — POC OCCULT BLOOD, ED: Fecal Occult Bld: NEGATIVE

## 2021-07-03 LAB — URINE CULTURE: Culture: 10000 — AB

## 2021-07-11 ENCOUNTER — Other Ambulatory Visit: Payer: Self-pay | Admitting: Family Medicine

## 2021-07-11 DIAGNOSIS — E1165 Type 2 diabetes mellitus with hyperglycemia: Secondary | ICD-10-CM

## 2021-08-10 ENCOUNTER — Other Ambulatory Visit: Payer: Self-pay | Admitting: Family Medicine

## 2021-08-10 DIAGNOSIS — E1165 Type 2 diabetes mellitus with hyperglycemia: Secondary | ICD-10-CM

## 2021-08-14 NOTE — Progress Notes (Addendum)
° ° °  SUBJECTIVE:   CHIEF COMPLAINT / HPI:   Discuss Trulicity  Frances Wilson is a 55 y.o. female who presents to the clinic today to discuss Trulicity.  States that she has been off of it for the last month because she was having GI side effects of abdominal pain, nausea, diarrhea.  States that she was doing fine and all the previous dosages but when she increased to 4.5 mg, she started to feel symptoms.  She has T2DM, last A1c was 8.2 on 04/16/21.   Diabetes, Type 2 - Last A1c 8.2 in August. - Medications: Metformin 1000 mg BID, Farxiga 10 mg. Stopped Trulicity about 1 month ago because of GI side effects. - Compliance: Daily  - Checking BG at home: Check randomly - Diet: "typically it's good. I like fruit, I am not big on veggies". Admits to eating more poorly during this holiday season - Exercise: Has [employer] membership but has not been going recently. Wants to start back up - Eye exam: Has had this year  - Foot exam: Due - Microalbumin: Due - Statin: Rosuvastatin 20 mg  - Denies symptoms of hypoglycemia, polyuria, polydipsia, numbness extremities, foot ulcers/trauma   PERTINENT  PMH / PSH:  Past Medical History:  Diagnosis Date   Anxiety    [facility]   Attention deficit disorder (ADD)    onset age 70; [facility]   Depression    Diabetes mellitus without complication (HCC) 08/19/2009   Hypertension     OBJECTIVE:   BP 135/80    Pulse 95    Wt 160 lb 6.4 oz (72.8 kg)    SpO2 97%    BMI 25.12 kg/m    General: NAD, pleasant, able to participate in exam Respiratory: Breathing comfortably on room air Extremities: no edema Foot exam: No deformities, ulcerations, or other skin breakdown on feet bilaterally.  Sensation intact to monofilament and light touch.  PT and DP pulses intact BL.   Skin: warm and dry, no rashes noted Psych: Normal affect and mood, comical and making jokes throughout encounter  ASSESSMENT/PLAN:   Type 2 diabetes mellitus with  hyperglycemia (HCC) Patient reports having stopped Trulicity for the last months due to GI side effects. Was previously doing well on other dosages but couldn't tolerate max dose. A1c today 8.4, slightly worsened from prior A1c of 8.2.  -Restart Trulicity 0.75 mg/weekly; can increase in 4 weeks if tolerating -Continue Metformin and Faxiga -Discussed exercise and diet recommendations -Urine microalbumin today -Foot exam performed today -Continue statin -F/u in 3 months  Microscopic hematuria Noted on chart review from ED visit in November. Did not appear to have a UTI at that time. -Please repeat UA at follow up    Sabino Dick, DO [facility]

## 2021-08-15 ENCOUNTER — Other Ambulatory Visit: Payer: Self-pay

## 2021-08-15 ENCOUNTER — Encounter: Payer: Self-pay | Admitting: Family Medicine

## 2021-08-15 ENCOUNTER — Ambulatory Visit: Payer: Managed Care, Other (non HMO) | Admitting: Family Medicine

## 2021-08-15 VITALS — BP 135/80 | HR 95 | Wt 160.4 lb

## 2021-08-15 DIAGNOSIS — R3129 Other microscopic hematuria: Secondary | ICD-10-CM | POA: Diagnosis not present

## 2021-08-15 DIAGNOSIS — E1165 Type 2 diabetes mellitus with hyperglycemia: Secondary | ICD-10-CM | POA: Diagnosis not present

## 2021-08-15 LAB — POCT GLYCOSYLATED HEMOGLOBIN (HGB A1C): HbA1c, POC (controlled diabetic range): 8.4 % — AB (ref 0.0–7.0)

## 2021-08-15 MED ORDER — TRULICITY 0.75 MG/0.5ML ~~LOC~~ SOAJ: 0.7500 mg | SUBCUTANEOUS | 2 refills | Status: DC

## 2021-08-15 NOTE — Assessment & Plan Note (Signed)
Noted on chart review from ED visit in November. Did not appear to have a UTI at that time. -Please repeat UA at follow up

## 2021-08-15 NOTE — Assessment & Plan Note (Addendum)
Patient reports having stopped Trulicity for the last months due to GI side effects. Was previously doing well on other dosages but couldn't tolerate max dose. A1c today 8.4, slightly worsened from prior A1c of 8.2.  -Restart Trulicity 0.75 mg/weekly; can increase in 4 weeks if tolerating -Continue Metformin and Faxiga -Discussed exercise and diet recommendations -Urine microalbumin today -Foot exam performed today -Continue statin -F/u in 3 months

## 2021-08-15 NOTE — Patient Instructions (Signed)
It was wonderful to see you today.  Please bring ALL of your medications with you to every visit.   Today we talked about:  -Your A1c was 8.4 today! The goal will be to get this under 7.  -We are starting back on the Trulicity. Inject 0.75 mg every week. -In 4 weeks, if you are doing well, we can increase your dose. -Return in 3 months for another follow up. -We did a urine test to check on your kidneys, I will let you know the results via MyChart.    Diet Recommendations for Diabetes  Carbohydrate includes starch, sugar, and fiber.  Of these, only sugar and starch raise blood glucose.  (Fiber is found in fruits, vegetables [especially skin, seeds, and stalks], whole grains, and beans.)   Starchy (carb) foods: Bread, rice, pasta, potatoes, corn, cereal, grits, crackers, bagels, muffins, all baked goods.  (Fruit, milk, and yogurt also have carbohydrate, but most of these foods will not spike your blood sugar as most starchy or sweet foods will.)  A few fruits do cause high blood sugars; use small portions of bananas (limit to 1/2 at a time), grapes, watermelon, and oranges.   Protein foods: Meat, fish, poultry, eggs, dairy foods, and beans such as pinto and kidney beans (beans also provide carbohydrate).   1. Eat at least 3 REAL meals and 1-2 snacks per day. Eat breakfast within the first hour of getting up.  Have something to eat at least every 5 hours while awake.   2. Limit starchy foods to TWO per meal and ONE per snack. ONE portion of a starchy food is equal to the following:   - ONE slice of bread (or its equivalent, such as half of a hamburger bun).   - 1/2 cup of a "scoopable" starchy food such as potatoes or rice.   - 15 grams of Total Carbohydrate as shown on food label.   - Every 4 ounces of a sweet drink (including fruit juice). 3. Include twice the volume of vegetables as protein or carbohydrate foods for BOTH lunch and dinner as often as you can.   - Fresh or frozen vegetables  are best.   - Keep frozen vegetables on hand for a quick option.          Thank you for choosing [facility].   Please call [phone] with any questions about today's appointment.  Please be sure to schedule follow up at the front  desk before you leave today.   Sabino Dick, DO PGY-2 Family Medicine

## 2021-08-16 LAB — MICROALBUMIN / CREATININE URINE RATIO
Creatinine, Urine: 44.4 mg/dL
Microalb/Creat Ratio: 16 mg/g creat (ref 0–29)
Microalbumin, Urine: 7 ug/mL

## 2021-09-09 ENCOUNTER — Other Ambulatory Visit: Payer: Self-pay | Admitting: Family Medicine

## 2021-09-09 DIAGNOSIS — E1165 Type 2 diabetes mellitus with hyperglycemia: Secondary | ICD-10-CM

## 2021-10-09 ENCOUNTER — Other Ambulatory Visit: Payer: Self-pay | Admitting: Family Medicine

## 2021-10-09 DIAGNOSIS — E1165 Type 2 diabetes mellitus with hyperglycemia: Secondary | ICD-10-CM

## 2021-10-15 ENCOUNTER — Other Ambulatory Visit: Payer: Self-pay

## 2021-10-15 MED ORDER — ROSUVASTATIN CALCIUM 20 MG PO TABS: 20.0000 mg | ORAL_TABLET | Freq: Every day | ORAL | 3 refills | Status: DC

## 2021-11-08 ENCOUNTER — Other Ambulatory Visit: Payer: Self-pay | Admitting: Family Medicine

## 2021-11-08 DIAGNOSIS — E1165 Type 2 diabetes mellitus with hyperglycemia: Secondary | ICD-10-CM

## 2021-11-13 ENCOUNTER — Other Ambulatory Visit: Payer: Self-pay | Admitting: Family Medicine

## 2021-12-27 ENCOUNTER — Ambulatory Visit: Payer: Managed Care, Other (non HMO) | Admitting: Family Medicine

## 2021-12-27 ENCOUNTER — Telehealth: Payer: Self-pay

## 2021-12-27 ENCOUNTER — Encounter: Payer: Self-pay | Admitting: Family Medicine

## 2021-12-27 VITALS — BP 124/72 | HR 79 | Ht 67.0 in | Wt 170.2 lb

## 2021-12-27 DIAGNOSIS — F419 Anxiety disorder, unspecified: Secondary | ICD-10-CM

## 2021-12-27 DIAGNOSIS — Z1231 Encounter for screening mammogram for malignant neoplasm of breast: Secondary | ICD-10-CM

## 2021-12-27 DIAGNOSIS — I10 Essential (primary) hypertension: Secondary | ICD-10-CM | POA: Diagnosis not present

## 2021-12-27 DIAGNOSIS — F32A Depression, unspecified: Secondary | ICD-10-CM

## 2021-12-27 DIAGNOSIS — E1165 Type 2 diabetes mellitus with hyperglycemia: Secondary | ICD-10-CM

## 2021-12-27 DIAGNOSIS — Z0000 Encounter for general adult medical examination without abnormal findings: Secondary | ICD-10-CM

## 2021-12-27 DIAGNOSIS — E785 Hyperlipidemia, unspecified: Secondary | ICD-10-CM

## 2021-12-27 LAB — POCT GLYCOSYLATED HEMOGLOBIN (HGB A1C): HbA1c, POC (controlled diabetic range): 9 % — AB (ref 0.0–7.0)

## 2021-12-27 MED ORDER — TRULICITY 0.75 MG/0.5ML ~~LOC~~ SOAJ: 1.5000 mg | SUBCUTANEOUS | 0 refills | Status: DC

## 2021-12-27 NOTE — Patient Instructions (Addendum)
Thank you for coming to see me today. It was a pleasure.  ? ?We will get some labs today.  If they are abnormal or we need to do something about them, I will call you.  If they are normal, I will send you a message on MyChart (if it is active) or a letter in the mail.  If you don't hear from [place] in 2 weeks, please call the office at the number below.  ? ? ?You are due for an eye exam.  Please make sure that you call your eye doctor to have this scheduled and have them fax the results to our office.  ? ?I have placed an order for your mammogram.  Please call [facility] at [phone] to schedule your appointment within one week.  ? ?Recommend Shingles vaccine.  This is a 2 dose series and can be given at your local pharmacy.  Please talk to your pharmacist about this.  ? ?Please schedule appointment for PAP smear and Diabetic Foot exam with your PCP ? ? ?Please go to:  ?[facility] ?[address]  ?[place], [place] [address] ?[phone] ? ? ?Urgent psychiatry (medication management) Monday-Thursday 8-11AM.   ?It is highly recommended that you show up at 7/730 because it is first come first serve.  ? ? ?For urgent therapy (not medication) Walk in hours are 8-1pm Monday through Wednesday (please come at 7/730 to ensure you are seen)  ? ?Please follow-up with PCP in 3 weeks ? ?If you have any questions or concerns, please do not hesitate to call the office at [phone]. ? ?Best,  ? ?Dana Allan, MD   ?

## 2021-12-27 NOTE — Progress Notes (Signed)
    SUBJECTIVE:   CHIEF COMPLAINT / HPI: high blood pressure  Feeling unwell this morning.  Check blood glucose and was 272.  Went to work, consumed lots of water and recheck glucose 240's.  Reports thought blood pressure was high and made appointment.  Has been without Trulicity for 2 weeks.  Was taking 0.75 mg weekly and ran out. Had headache and dizziness this am that has since resolved.  Endorses polyuria.  Denies any chest pain, shortness of breath, nausea/vomiting, diarrhea, dysuria, weakness, slurred speech, numbness or tingling.    PERTINENT  PMH / PSH:  HTN DM Type 2 Mood disorder  OBJECTIVE:   BP 124/72   Pulse 79   Ht 5\' 7"  (1.702 m)   Wt 170 lb 3.2 oz (77.2 kg)   SpO2 98%   BMI 26.66 kg/m    General: Alert, no acute distress Cardio: Normal S1 and S2, RRR, no r/m/g Pulm: CTAB, normal work of breathing Abdomen: Bowel sounds normal. Abdomen soft and non-tender.  Extremities: No peripheral edema.  Neuro: CN II: PERRL CN III, IV,VI: EOMI CV V: Normal sensation in V1, V2, V3 CVII: Symmetric smile and brow raise CN VIII: Normal hearing CN IX,X: Symmetric palate raise  CN XI: 5/5 shoulder shrug CN XII: Symmetric tongue protrusion  UE and LE strength 5/5 2+ UE and LE reflexes  Normal sensation in UE and LE bilaterally  No ataxia with finger to nose, normal heel to shin  Negative Rhomberg     ASSESSMENT/PLAN:   Type 2 diabetes mellitus with hyperglycemia (HCC) A1c 9.0 today.   Increase Trulicity 1.5mg  weekly Continue Farxiga 10 mg daily Continue Metformin 1g BID Foot exam due, patient to schedule with PCP Eye exam due 03/2022 Follow up with PCP in 1-2 weeks  Strict return precautions provided  Essential hypertension, benign BP at goal today Monitor BP at home 1-2 times week, record readings and bring to next visit Consider initiating low dose ARB for kidney protection  Follow up with PCP in 1-2 weeks Strict return precautions provided  Anxiety and  depression PHQ 9 14.  No SI/HI.  Continue Prozac Mental health resources provided Follow up with PCP for continued monitoring   Hyperlipidemia Lipid panel today Continue Statin Follow up with results  Healthcare maintenance Recommend Shingles vaccine Mammogram ordered today Follow up with PCP for further HCM maintenance      04/2022, MD [facility]

## 2021-12-27 NOTE — Telephone Encounter (Signed)
Patient calls nurse line requesting "same day" apt for not "feeling well."  ? ?Patient reports fasting blood sugar this am ~220. Patient reports taking Metformin and [place]. Patient reports she has been out of Trulicity for ~ 2 weeks.  ? ?Patient reports her BP has been ~140s/90s checked with home cuff. Patient denies being on anything for BP control.  ? ?Patient scheduled for this afternoon for evaluation.  ?

## 2021-12-28 LAB — BASIC METABOLIC PANEL
BUN/Creatinine Ratio: 17 (ref 9–23)
BUN: 11 mg/dL (ref 6–24)
CO2: 24 mmol/L (ref 20–29)
Calcium: 10.3 mg/dL — ABNORMAL HIGH (ref 8.7–10.2)
Chloride: 97 mmol/L (ref 96–106)
Creatinine, Ser: 0.66 mg/dL (ref 0.57–1.00)
Glucose: 194 mg/dL — ABNORMAL HIGH (ref 70–99)
Potassium: 3.9 mmol/L (ref 3.5–5.2)
Sodium: 138 mmol/L (ref 134–144)
eGFR: 102 mL/min/{1.73_m2} (ref >59)

## 2021-12-28 LAB — LIPID PANEL
Chol/HDL Ratio: 3.4 ratio (ref 0.0–4.4)
Cholesterol, Total: 162 mg/dL (ref 100–199)
HDL: 47 mg/dL (ref >39)
LDL Chol Calc (NIH): 95 mg/dL (ref 0–99)
Triglycerides: 112 mg/dL (ref 0–149)
VLDL Cholesterol Cal: 20 mg/dL (ref 5–40)

## 2021-12-28 MED ORDER — TRULICITY 1.5 MG/0.5ML ~~LOC~~ SOAJ: 1.5000 mg | SUBCUTANEOUS | 0 refills | Status: DC

## 2021-12-29 ENCOUNTER — Other Ambulatory Visit: Payer: Self-pay | Admitting: Family Medicine

## 2021-12-29 DIAGNOSIS — E1165 Type 2 diabetes mellitus with hyperglycemia: Secondary | ICD-10-CM

## 2021-12-31 ENCOUNTER — Encounter: Payer: Self-pay | Admitting: Family Medicine

## 2021-12-31 DIAGNOSIS — Z0000 Encounter for general adult medical examination without abnormal findings: Secondary | ICD-10-CM | POA: Insufficient documentation

## 2021-12-31 NOTE — Assessment & Plan Note (Signed)
Recommend Shingles vaccine ?Mammogram ordered today ?Follow up with PCP for further HCM maintenance  ?

## 2021-12-31 NOTE — Assessment & Plan Note (Signed)
BP at goal today ?Monitor BP at home 1-2 times week, record readings and bring to next visit ?Consider initiating low dose ARB for kidney protection  ?Follow up with PCP in 1-2 weeks ?Strict return precautions provided ?

## 2021-12-31 NOTE — Assessment & Plan Note (Addendum)
A1c 9.0 today.   ?Increase Trulicity 1.5mg  weekly ?Continue Farxiga 10 mg daily ?Continue Metformin 1g BID ?Foot exam due, patient to schedule with PCP ?Eye exam due 03/2022 ?Follow up with PCP in 1-2 weeks  ?Strict return precautions provided ?

## 2021-12-31 NOTE — Assessment & Plan Note (Signed)
Lipid panel today ?Continue Statin ?Follow up with results ?

## 2021-12-31 NOTE — Assessment & Plan Note (Signed)
PHQ 9 14.  No SI/HI.  ?Continue Prozac ?Mental health resources provided ?Follow up with PCP for continued monitoring ? ?

## 2022-01-15 ENCOUNTER — Ambulatory Visit: Payer: Managed Care, Other (non HMO) | Admitting: Family Medicine

## 2022-01-15 ENCOUNTER — Encounter: Payer: Self-pay | Admitting: Family Medicine

## 2022-01-15 DIAGNOSIS — I10 Essential (primary) hypertension: Secondary | ICD-10-CM

## 2022-01-15 NOTE — Progress Notes (Unsigned)
    SUBJECTIVE:   CHIEF COMPLAINT / HPI:   Hypertension concerns Patient presenting today for concerns regarding hypertension.  She reports she has been checking her blood pressures at work as well as at home and they have been elevated with a recheck.  When she checks her blood pressures here she is within normal limits.  She also reports she has been having headaches.  Unclear etiology of the headaches but they do resolve with Tylenol.  No shortness of breath or chest pain.  OBJECTIVE:   BP 115/75   Pulse 87   Temp 98.9 F (37.2 C)   Ht 5\' 7"  (1.702 m)   Wt 168 lb 12.8 oz (76.6 kg)   SpO2 96%   BMI 26.44 kg/m   General: Pleasant, well-appearing 55 year old female Follow-up: Regular rate and rhythm Respiratory: Normal for breathing Abdomen: Soft, nontender, positive bowel sounds MSK: No gross abnormalities  ASSESSMENT/PLAN:   Essential hypertension, benign Blood pressure continues to be at goal today.  She is still concerned regarding her blood pressures at home.  Discussed ambulatory blood pressure monitoring with the patient and she would like to do this.  We will not prescribe any medications at this time.  Pharmacy team message regarding setting up ambulatory blood pressure monitoring.     Gifford Shave, MD [place]

## 2022-01-15 NOTE — Patient Instructions (Signed)
It was great seeing you today!  I am sorry about your job.  Your blood pressure looks good today but I would like to get ambulatory blood pressure monitoring.  I am sending a note to our scheduler to get that set up and someone should call you to schedule the test.  Regarding your Prozac if you would like to make adjustments to the dose we can do that so please let me know.  I hope you have a wonderful day!

## 2022-01-16 NOTE — Assessment & Plan Note (Signed)
Blood pressure continues to be at goal today.  She is still concerned regarding her blood pressures at home.  Discussed ambulatory blood pressure monitoring with the patient and she would like to do this.  We will not prescribe any medications at this time.  Pharmacy team message regarding setting up ambulatory blood pressure monitoring.

## 2022-01-22 ENCOUNTER — Encounter: Payer: Self-pay | Admitting: *Deleted

## 2022-01-24 ENCOUNTER — Other Ambulatory Visit: Payer: Self-pay | Admitting: Family Medicine

## 2022-01-26 ENCOUNTER — Other Ambulatory Visit: Payer: Self-pay | Admitting: Family Medicine

## 2022-01-26 DIAGNOSIS — E1165 Type 2 diabetes mellitus with hyperglycemia: Secondary | ICD-10-CM

## 2022-01-31 ENCOUNTER — Ambulatory Visit
Admission: RE | Admit: 2022-01-31 | Discharge: 2022-01-31 | Disposition: A | Discharge status: Home / Self Care | Payer: Managed Care, Other (non HMO) | Source: Ambulatory Visit | Attending: Family Medicine | Admitting: Family Medicine

## 2022-01-31 DIAGNOSIS — Z1231 Encounter for screening mammogram for malignant neoplasm of breast: Secondary | ICD-10-CM

## 2022-02-01 ENCOUNTER — Ambulatory Visit: Payer: Managed Care, Other (non HMO) | Admitting: Pharmacist

## 2022-02-01 DIAGNOSIS — I10 Essential (primary) hypertension: Secondary | ICD-10-CM | POA: Diagnosis not present

## 2022-02-01 NOTE — Patient Instructions (Signed)
Blood Pressure Activity Diary Time Lying down/ Sleeping Walking/ Exercise Stressed/ Angry Headache/ Pain Dizzy  9 AM       10 AM       11 AM       12 PM       1 PM       2 PM       Time Lying down/ Sleeping Walking/ Exercise Stressed/ Angry Headache/ Pain Dizzy  3 PM       4 PM        5 PM       6 PM       7 PM       8 PM       Time Lying down/ Sleeping Walking/ Exercise Stressed/ Angry Headache/ Pain Dizzy  9 PM       10 PM       11 PM       12 AM       1 AM       2 AM       3 AM       Time Lying down/ Sleeping Walking/ Exercise Stressed/ Angry Headache/ Pain Dizzy  4 AM       5 AM       6 AM       7 AM       8 AM       9 AM       10 AM        Time you woke up: _________                  Time you went to sleep:__________  Come back Monday at 8:30 to have the monitor removed Call the [facility] if you have any questions before then ([phone])  Wearing the Blood Pressure Monitor The cuff will inflate every 20 minutes during the day and every 30 minutes while you sleep. Your blood pressure readings will NOT display after cuff inflation Fill out the blood pressure-activity diary during the day, especially during activities that may affect your reading -- such as exercise, stress, walking, taking your blood pressure medications  Important things to know: Avoid taking the monitor off for the next 24 hours, unless it causes you discomfort or pain. Do NOT get the monitor wet and do NOT dry to clean the monitor with any cleaning products. Do NOT put the monitor on anyone else's arm. When the cuff inflates, avoid excess movement. Let the cuffed arm hang loosely, slightly away from the body. Avoid flexing the muscles or moving the hand/fingers. When you go to sleep, make sure that the hose is not kinked. Remember to fill out the blood pressure activity diary. If you experience severe pain or unusual pain (not associated with getting your blood pressure checked),  remove the monitor.  Troubleshooting:  Code  Troubleshooting   1  Check cuff position, tighten cuff   2, 3  Remain still during reading   4, 87  Check air hose connections and make sure cuff is tight   85, 89  Check hose connections and make tubing is not crimped   86  Push START/STOP to restart reading   88, 91  Retry by pushing START/STOP   90  Replace batteries. If problem persists, remove monitor and bring back to   clinic at follow up   97, 98, 99  Service required - Remove monitor and bring back to clinic at  follow up     Day #2 Restart Lisinopril/ HCTZ  Same as previous dose - 1 daily.  Follow-up with New PCP for recheck blood pressure.
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
## 2022-02-04 ENCOUNTER — Encounter: Payer: Self-pay | Admitting: Pharmacist

## 2022-02-04 ENCOUNTER — Telehealth: Payer: Self-pay

## 2022-02-04 MED ORDER — LISINOPRIL-HYDROCHLOROTHIAZIDE 20-12.5 MG PO TABS: 1.0000 | ORAL_TABLET | Freq: Every day | ORAL | 1 refills | Status: DC

## 2022-02-04 NOTE — Assessment & Plan Note (Signed)
History of hypertension including blood pressure medication use in past.  Stopped blood pressure medication Lisinopril/HCTZ 20/12.5mg  once daily when blood pressure we well controlled.  24-hour ambulatory blood pressure demonstrates elevated > goal of < 130/80 despite having a quiet restful day at home while wearing monitor.  Average awake blood pressure of 135/72mmHg, and a nocturnal dipping pattern that is abnormal  only 5% .  Changes to medications - Reinitiate lisinopril/HCTZ at previously tolerated and effective dose.  - Follow-up with PCP for blood pressure reevaluation.

## 2022-02-04 NOTE — Telephone Encounter (Signed)
A Prior Authorization reauthorization was initiated & APPROVED for this patients TRULICITY through 02/04/23 via CoverMyMeds.   Key:  [id number]

## 2022-02-04 NOTE — Progress Notes (Signed)
Reviewed: I agree with Dr. Koval's documentation and management. 

## 2022-02-15 ENCOUNTER — Other Ambulatory Visit: Payer: Self-pay | Admitting: Family Medicine

## 2022-02-17 ENCOUNTER — Other Ambulatory Visit: Payer: Self-pay | Admitting: Family Medicine

## 2022-02-17 DIAGNOSIS — E1165 Type 2 diabetes mellitus with hyperglycemia: Secondary | ICD-10-CM

## 2022-02-19 ENCOUNTER — Ambulatory Visit (HOSPITAL_COMMUNITY)
Admission: EM | Admit: 2022-02-19 | Discharge: 2022-02-19 | Disposition: A | Discharge status: Home / Self Care | Payer: Managed Care, Other (non HMO) | Attending: Internal Medicine | Admitting: Internal Medicine

## 2022-02-19 ENCOUNTER — Encounter (HOSPITAL_COMMUNITY): Payer: Self-pay | Admitting: Emergency Medicine

## 2022-02-19 ENCOUNTER — Ambulatory Visit (INDEPENDENT_AMBULATORY_CARE_PROVIDER_SITE_OTHER): Payer: Managed Care, Other (non HMO)

## 2022-02-19 DIAGNOSIS — S239XXA Sprain of unspecified parts of thorax, initial encounter: Secondary | ICD-10-CM | POA: Insufficient documentation

## 2022-02-19 DIAGNOSIS — R0602 Shortness of breath: Secondary | ICD-10-CM | POA: Insufficient documentation

## 2022-02-19 LAB — CBC
HCT: 45.6 % (ref 36.0–46.0)
Hemoglobin: 14.8 g/dL (ref 12.0–15.0)
MCH: 26.7 pg (ref 26.0–34.0)
MCHC: 32.5 g/dL (ref 30.0–36.0)
MCV: 82.3 fL (ref 80.0–100.0)
Platelets: 362 10*3/uL (ref 150–400)
RBC: 5.54 MIL/uL — ABNORMAL HIGH (ref 3.87–5.11)
RDW: 12.8 % (ref 11.5–15.5)
WBC: 9.4 10*3/uL (ref 4.0–10.5)
nRBC: 0 % (ref 0.0–0.2)

## 2022-02-19 LAB — COMPREHENSIVE METABOLIC PANEL
ALT: 11 U/L (ref 0–44)
AST: 28 U/L (ref 15–41)
Albumin: 4 g/dL (ref 3.5–5.0)
Alkaline Phosphatase: 64 U/L (ref 38–126)
Anion gap: 12 (ref 5–15)
BUN: 14 mg/dL (ref 6–20)
CO2: 25 mmol/L (ref 22–32)
Calcium: 9.7 mg/dL (ref 8.9–10.3)
Chloride: 101 mmol/L (ref 98–111)
Creatinine, Ser: 0.84 mg/dL (ref 0.44–1.00)
GFR, Estimated: >60 mL/min (ref >60)
Glucose, Bld: 154 mg/dL — ABNORMAL HIGH (ref 70–99)
Potassium: 4 mmol/L (ref 3.5–5.1)
Sodium: 138 mmol/L (ref 135–145)
Total Bilirubin: 0.5 mg/dL (ref 0.3–1.2)
Total Protein: 7.4 g/dL (ref 6.5–8.1)

## 2022-02-19 LAB — POCT URINALYSIS DIPSTICK, ED / UC
Bilirubin Urine: NEGATIVE
Glucose, UA: 500 mg/dL — AB
Leukocytes,Ua: NEGATIVE
Nitrite: NEGATIVE
Protein, ur: NEGATIVE mg/dL
Specific Gravity, Urine: 1.02 (ref 1.005–1.030)
Urobilinogen, UA: 0.2 mg/dL (ref 0.0–1.0)
pH: 5 (ref 5.0–8.0)

## 2022-02-19 LAB — CBG MONITORING, ED: Glucose-Capillary: 171 mg/dL — ABNORMAL HIGH (ref 70–99)

## 2022-02-19 MED ORDER — ACETAMINOPHEN 500 MG PO TABS: 1000.0000 mg | ORAL_TABLET | Freq: Four times a day (QID) | ORAL | 0 refills | Status: DC | PRN

## 2022-02-19 MED ORDER — IBUPROFEN 600 MG PO TABS: 600.0000 mg | ORAL_TABLET | Freq: Four times a day (QID) | ORAL | 0 refills | Status: DC | PRN

## 2022-02-19 NOTE — Discharge Instructions (Addendum)
Your physical exam and work-up are reassuring today at urgent care.  Your chest x-ray is negative for abnormality to your lungs and heart. We drew labs to look at your blood counts, your electrolytes, and kidney function. Follow-up with your PCP in the next week for further evaluation of your shortness of breath with exertion. If your shortness of breath suddenly becomes worse, you develop chest discomfort/pain/tightness, become sweaty, or dizzy, please return to urgent care or go to the emergency department if your symptoms are severe.   If you develop any new or worsening symptoms or do not improve in the next 2 to 3 days, please return.  If your symptoms are severe, please go to the emergency room.  Follow-up with your primary care provider for further evaluation and management of your symptoms as well as ongoing wellness visits.  I hope you feel better!

## 2022-02-19 NOTE — ED Triage Notes (Signed)
Pt reports that this morning was little SOB. Become very SOB when vacuuming this morning. Heaviness in arms and legs. Pain are different feeling in her back.

## 2022-02-19 NOTE — ED Provider Notes (Signed)
[facility]    CSN: [id number] Arrival date & time: 02/19/22  1638      History   Chief Complaint No chief complaint on file.   HPI Frances Wilson is a 56 y.o. female.   Patient presents to urgent care for evaluation of shortness of breath that started this morning after she was vacuuming her house.  She states that she is normally able to vacuum her house without becoming short of breath, but this morning she needed to sit down and rest/take breaks.  She denies chest pain, nausea, vomiting, dizziness, feeling jittery, rash, fatigue, headache, confusion, urinary frequency, dysuria, and feeling lightheaded.  Shortness of breath has improved since this morning. Denies syncope.  She is also reporting bilateral thoracic back pain that started today.  Pain starts in the middle of her thoracic spine, then radiates outward towards her bilateral flanks. Cannot identify aggravating or relieving factors for back pain and pain does not worsen or improve with movement.  She is a type II diabetic and states that she takes Trulicity, metformin, and fark [employer] for her diabetes.  Her blood sugar was 165 this morning when she checked it and she states that this is low for her.  Patient states that she usually starts feeling shaky when her blood sugar is 200.  She checks her blood sugar intermittently at home. Denies recent antibiotic use, surgical procedures, and illnesses.  No personal history of cardiac or neurologic problems.  States she was recently started back on her blood pressure medication after being off of it for 3 years.  2 weeks ago, she began taking lisinopril-hydrochlorothiazide prescribed by her PCP. No headache, blurry vision, ear ringing, or decreased visual acuity reported. Reports attempting to drink plenty of water, but states that she still feels thirsty. No other aggravating or relieving factors identified for symptoms at this time.     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   LEG SURGERY     L hip fracture s/p ORIF.     OB History   No obstetric history on file.      Home Medications    Prior to Admission medications   Medication Sig Start Date End Date Taking? Authorizing Provider  acetaminophen (TYLENOL) 500 MG tablet Take 2 tablets (1,000 mg total) by mouth every 6 (six) hours as  needed. 02/19/22  Yes Carlisle Beers, FNP  ibuprofen (ADVIL) 600 MG tablet Take 1 tablet (600 mg total) by mouth every 6 (six) hours as needed. 02/19/22  Yes Carlisle Beers, FNP  aspirin EC 81 MG tablet Take 1 tablet (81 mg total) by mouth daily. 04/30/19   Marthenia Rolling, DO  FARXIGA 10 MG TABS tablet Take 1 tablet by mouth once daily 02/18/22   Glendale Chard, DO  FLUoxetine (PROZAC)  40 MG capsule Take 1 capsule daily. 05/15/21   Celedonio Savage, MD  lisinopril-hydrochlorothiazide (ZESTORETIC) 20-12.5 MG tablet Take 1 tablet by mouth daily. 02/04/22   Moses Manners, MD  metFORMIN (GLUCOPHAGE-XR) 500 MG 24 hr tablet Take 2 tablets (1,000 mg total) by mouth 2 (two) times daily. 02/08/21   Celedonio Savage, MD  Multiple Vitamin (MULTIVITAMIN ADULT PO) Take 1 tablet by mouth daily.    [provider]  rosuvastatin (CRESTOR) 20 MG tablet Take 1 tablet (20 mg total) by mouth daily. 10/15/21   Celedonio Savage, MD  traZODone (DESYREL) 50 MG tablet Take 1 tablet (50 mg total) by mouth at bedtime as needed for sleep. 04/03/21   Celedonio Savage, MD  TRULICITY 1.5 MG/0.5ML SOPN INJECT 1.5 MG INTO THE SKIN ONCE A WEEK 02/15/22   Celedonio Savage, MD    Family History Family History  Problem Relation Age of Onset   Alcohol abuse Mother    Thyroid disease Mother    Heart disease Sister        AMI secondary to substance abuse   Breast cancer Maternal Grandmother    Pancreatic cancer Maternal Grandfather    Stomach cancer Other        GREAT AUNT   Colon cancer Neg Hx     Social History Social History   Tobacco Use   Smoking status: Former    Packs/day: 1.50    Years: 18.00    Total pack years: 27.00    Types: Cigarettes    Start date: 08/19/1981    Quit date: 12/18/1999    Years since quitting: 22.1   Smokeless tobacco: Never  Vaping Use   Vaping Use: Never used  Substance Use Topics   Alcohol use: Yes    Alcohol/week: 0.0 standard drinks of alcohol    Comment: Occasional use    Drug use: No     Allergies   Maxipime [cefepime]   Review of Systems Review of Systems Per HPI  Physical Exam Triage Vital Signs ED Triage Vitals  Enc Vitals Group     BP 02/19/22 1709 110/74     Pulse Rate 02/19/22 1709 94     Resp 02/19/22 1709 19     Temp 02/19/22 1709 98.7 F (37.1 C)     Temp src --      SpO2 02/19/22 1709 97 %     Weight --      Height --       Head Circumference --      Peak Flow --      Pain Score 02/19/22 1706 4     Pain Loc --      Pain Edu? --      Excl. in GC? --    No data found.  Updated Vital Signs BP 110/74 (BP Location: Left Arm)   Pulse 94   Temp 98.7 F (37.1 C)   Resp 19   SpO2 97%   Visual Acuity Right Eye Distance:   Left Eye  Distance:   Bilateral Distance:    Right Eye Near:   Left Eye Near:    Bilateral Near:     Physical Exam Vitals and nursing note reviewed.  Constitutional:      Appearance: Normal appearance. She is not ill-appearing or toxic-appearing.     Comments: Very pleasant patient sitting on exam in position of comfort table in no acute distress.   HENT:     Head: Normocephalic and atraumatic.     Right Ear: Hearing, tympanic membrane, ear canal and external ear normal.     Left Ear: Hearing, tympanic membrane, ear canal and external ear normal.     Nose: Nose normal. No rhinorrhea.     Mouth/Throat:     Lips: Pink.     Mouth: Mucous membranes are dry.     Pharynx: Oropharynx is clear. Uvula midline. No posterior oropharyngeal erythema.  Eyes:     General: Lids are normal. Vision grossly intact. Gaze aligned appropriately.     Extraocular Movements: Extraocular movements intact.     Conjunctiva/sclera: Conjunctivae normal.  Cardiovascular:     Rate and Rhythm: Normal rate and regular rhythm.     Pulses:          Dorsalis pedis pulses are 2+ on the right side and 2+ on the left side.     Heart sounds: Normal heart sounds, S1 normal and S2 normal.  Pulmonary:     Effort: Pulmonary effort is normal. No respiratory distress.     Breath sounds: Normal breath sounds and air entry. No decreased breath sounds or wheezing.  Abdominal:     General: Abdomen is flat. Bowel sounds are normal.     Palpations: Abdomen is soft.     Tenderness: There is no abdominal tenderness. There is no right CVA tenderness, left CVA tenderness or guarding.  Musculoskeletal:     Cervical back: Normal  and neck supple.     Thoracic back: Tenderness present. No signs of trauma, lacerations, spasms or bony tenderness. Normal range of motion.     Lumbar back: Normal.     Right lower leg: No edema.     Left lower leg: No edema.     Comments: Mid thoracic back pain with palpation that radiates to the thoracic paraspinal muscles.  No CVA tenderness bilaterally.  No ecchymosis, evidence of trauma or injury, or skin color change. No sciatic nerve pain. No numbness or tingling/radiculopathy elicited with palpation of C, T, and L spine.   Skin:    General: Skin is warm and dry.     Capillary Refill: Capillary refill takes less than 2 seconds.     Findings: No rash.     Comments: No skin tenting. No rash. Skin turgor normal.   Neurological:     General: No focal deficit present.     Mental Status: She is alert and oriented to person, place, and time. Mental status is at baseline.     Cranial Nerves: No dysarthria or facial asymmetry.     Motor: No weakness.     Gait: Gait is intact. Gait normal.  Psychiatric:        Mood and Affect: Mood normal.        Speech: Speech normal.        Behavior: Behavior normal.        Thought Content: Thought content normal.        Judgment: Judgment normal.      UC Treatments / Results  Labs (  all labs ordered are listed, but only abnormal results are displayed) Labs Reviewed  POCT URINALYSIS DIPSTICK, ED / UC - Abnormal; Notable for the following components:      Result Value   Glucose, UA 500 (*)    Ketones, ur TRACE (*)    Hgb urine dipstick SMALL (*)    All other components within normal limits  CBG MONITORING, ED - Abnormal; Notable for the following components:   Glucose-Capillary 171 (*)    All other components within normal limits  CBC  COMPREHENSIVE METABOLIC PANEL    EKG   Radiology DG Chest 2 View  Result Date: 02/19/2022 CLINICAL DATA:  Shortness of breath EXAM: CHEST - 2 VIEW COMPARISON:  None Available. FINDINGS: Heart size and  mediastinal contours are within normal limits. No suspicious pulmonary opacities identified. No pleural effusion or pneumothorax visualized. No acute osseous abnormality appreciated. IMPRESSION: No acute intrathoracic process identified. Electronically Signed   By: Jannifer Hick M.D.   On: 02/19/2022 18:45    Procedures Procedures (including critical care time)  Medications Ordered in UC Medications - No data to display  Initial Impression / Assessment and Plan / UC Course  I have reviewed the triage vital signs and the nursing notes.  Pertinent labs & imaging results that were available during my care of the patient were reviewed by me and considered in my medical decision making (see chart for details).  1.  Shortness of breath EKG in clinic is negative for acute cardiopulmonary abnormality and shows normal sinus rhythm with a ventricular rate of 89 bpm.  She denies orthopnea and her bilateral lower extremities are not swollen to physical exam.  She does not appear to be volume overloaded.  Neurologic exam is stable in clinic without focal deficit.  Cardiopulmonary exam is clear to auscultation.  Urinalysis shows mild dehydration, but is negative for urinary tract infection.  CBG is 171 in clinic.  This is stable compared to past CBGs and reported blood glucose levels at home.  Patient is without dizziness, jitteriness, and confusion in clinic. Chest x-ray obtained to rule out underlying cardiopulmonary abnormality and is negative for acute cardiopulmonary finding/abnormality.  CBC and CMP drawn in clinic today to assess for possible anemia and electrolyte abnormality.  We will call patient with results if abnormal.  Physical exam and work-up in clinic are overall reassuring.  Patient encouraged to increase water intake to at least 64 ounces of water per day to stay well-hydrated and eat a well-balanced diet.    2.  Thoracic back sprain Patient may take Tylenol 1000 mg and ibuprofen 600 mg  with food every 6 hours as needed for thoracic back pain. BMP  Unknown etiology to thoracic back pain, but suspect that this is musculoskeletal in nature.  She may apply heat to the upper back and perform gentle range of motion exercises to decrease back pain and prevent muscle stiffness.   Discussed physical exam and available lab work findings in clinic with patient.  Counseled patient regarding appropriate use of medications and potential side effects for all medications recommended or prescribed today. Discussed red flag signs and symptoms of worsening condition,when to call the PCP office, return to urgent care, and when to seek higher level of care in the emergency department. Patient verbalizes understanding and agreement with plan. All questions answered. Patient discharged in stable condition.  Final Clinical Impressions(s) / UC Diagnoses   Final diagnoses:  Shortness of breath  Thoracic back sprain, initial encounter  Discharge Instructions      Your physical exam and work-up are reassuring today at urgent care.  Your chest x-ray is negative for abnormality to your lungs and heart. We drew labs to look at your blood counts, your electrolytes, and kidney function. Follow-up with your PCP in the next week for further evaluation of your shortness of breath with exertion. If your shortness of breath suddenly becomes worse, you develop chest discomfort/pain/tightness, become sweaty, or dizzy, please return to urgent care or go to the emergency department if your symptoms are severe.   If you develop any new or worsening symptoms or do not improve in the next 2 to 3 days, please return.  If your symptoms are severe, please go to the emergency room.  Follow-up with your primary care provider for further evaluation and management of your symptoms as well as ongoing wellness visits.  I hope you feel better!     ED Prescriptions     Medication Sig Dispense Auth. Provider   acetaminophen  (TYLENOL) 500 MG tablet Take 2 tablets (1,000 mg total) by mouth every 6 (six) hours as needed. 30 tablet Reita MayStanhope, Savera Donson M, FNP   ibuprofen (ADVIL) 600 MG tablet Take 1 tablet (600 mg total) by mouth every 6 (six) hours as needed. 30 tablet Carlisle BeersStanhope, Arina Torry M, FNP      PDMP not reviewed this encounter.   Carlisle BeersStanhope, Makaiyah Schweiger M, [place]NP 02/19/22 1921

## 2022-02-25 ENCOUNTER — Other Ambulatory Visit: Payer: Self-pay | Admitting: Family Medicine

## 2022-02-25 DIAGNOSIS — E119 Type 2 diabetes mellitus without complications: Secondary | ICD-10-CM

## 2022-03-14 ENCOUNTER — Telehealth: Payer: Self-pay | Admitting: Student

## 2022-03-14 NOTE — Telephone Encounter (Signed)
Patient dropped off DMV placard form to be completed. Last DOS was 01/15/22. Placed in [employer].

## 2022-03-14 NOTE — Telephone Encounter (Signed)
Clinical info completed on Handicap form.  Placed form in Dr. Rondel Baton box for completion.    When form is completed, please route note to "RN Team" and place in wall pocket in front office.   Nylan Nevel Zimmerman Rumple, CMA

## 2022-03-15 ENCOUNTER — Other Ambulatory Visit: Payer: Self-pay | Admitting: Family Medicine

## 2022-03-15 NOTE — Telephone Encounter (Signed)
Patient informed that she needs an appt but she is transitioning to a new insurance and is not sure of when it will start.  Patient will call back once she gets this settled to set up an appt.  Kayode Petion,CMA

## 2022-03-15 NOTE — Telephone Encounter (Signed)
Patient called and informed that forms are ready for pick up. Copy made and placed in batch scanning. Original placed at front desk for pick up.   Starkisha Tullis C Rajiv Parlato, RN  

## 2022-03-26 LAB — HM DIABETES EYE EXAM

## 2022-03-31 ENCOUNTER — Other Ambulatory Visit: Payer: Self-pay | Admitting: Student

## 2022-03-31 ENCOUNTER — Other Ambulatory Visit: Payer: Self-pay | Admitting: Family Medicine

## 2022-03-31 DIAGNOSIS — E119 Type 2 diabetes mellitus without complications: Secondary | ICD-10-CM

## 2022-03-31 DIAGNOSIS — E1165 Type 2 diabetes mellitus with hyperglycemia: Secondary | ICD-10-CM

## 2022-04-24 ENCOUNTER — Other Ambulatory Visit: Payer: Self-pay | Admitting: Student

## 2022-04-24 DIAGNOSIS — E119 Type 2 diabetes mellitus without complications: Secondary | ICD-10-CM

## 2022-04-26 NOTE — Telephone Encounter (Signed)
LVM for pt to call office to give her the below message and to assist her in getting an appointment scheduled. Will also send a MyCHart message to her. Frances Wilson, CMA      Frances Chard, DO to [employer]       04/25/22  6:58 AM Hi Frances Wilson needs a diabetes follow up appointment within the next month. Thanks

## 2022-05-01 ENCOUNTER — Other Ambulatory Visit: Payer: Self-pay

## 2022-05-01 ENCOUNTER — Emergency Department (HOSPITAL_COMMUNITY)
Admission: EM | Admit: 2022-05-01 | Discharge: 2022-05-01 | Disposition: A | Discharge status: Home / Self Care | Payer: Managed Care, Other (non HMO) | Attending: Student | Admitting: Student

## 2022-05-01 DIAGNOSIS — T7840XA Allergy, unspecified, initial encounter: Secondary | ICD-10-CM | POA: Insufficient documentation

## 2022-05-01 DIAGNOSIS — E119 Type 2 diabetes mellitus without complications: Secondary | ICD-10-CM | POA: Insufficient documentation

## 2022-05-01 DIAGNOSIS — Z7982 Long term (current) use of aspirin: Secondary | ICD-10-CM | POA: Insufficient documentation

## 2022-05-01 DIAGNOSIS — Z79899 Other long term (current) drug therapy: Secondary | ICD-10-CM | POA: Insufficient documentation

## 2022-05-01 DIAGNOSIS — Z7984 Long term (current) use of oral hypoglycemic drugs: Secondary | ICD-10-CM | POA: Insufficient documentation

## 2022-05-01 DIAGNOSIS — I10 Essential (primary) hypertension: Secondary | ICD-10-CM | POA: Insufficient documentation

## 2022-05-01 DIAGNOSIS — D72829 Elevated white blood cell count, unspecified: Secondary | ICD-10-CM | POA: Insufficient documentation

## 2022-05-01 DIAGNOSIS — Z90710 Acquired absence of both cervix and uterus: Secondary | ICD-10-CM | POA: Insufficient documentation

## 2022-05-01 DIAGNOSIS — Z87891 Personal history of nicotine dependence: Secondary | ICD-10-CM | POA: Insufficient documentation

## 2022-05-01 LAB — COMPREHENSIVE METABOLIC PANEL
ALT: 18 U/L (ref 0–44)
AST: 24 U/L (ref 15–41)
Albumin: 4.1 g/dL (ref 3.5–5.0)
Alkaline Phosphatase: 56 U/L (ref 38–126)
Anion gap: 11 (ref 5–15)
BUN: 13 mg/dL (ref 6–20)
CO2: 26 mmol/L (ref 22–32)
Calcium: 10.2 mg/dL (ref 8.9–10.3)
Chloride: 103 mmol/L (ref 98–111)
Creatinine, Ser: 0.88 mg/dL (ref 0.44–1.00)
GFR, Estimated: >60 mL/min (ref >60)
Glucose, Bld: 169 mg/dL — ABNORMAL HIGH (ref 70–99)
Potassium: 3.9 mmol/L (ref 3.5–5.1)
Sodium: 140 mmol/L (ref 135–145)
Total Bilirubin: 0.8 mg/dL (ref 0.3–1.2)
Total Protein: 7.3 g/dL (ref 6.5–8.1)

## 2022-05-01 LAB — CBC WITH DIFFERENTIAL/PLATELET
Abs Immature Granulocytes: 0.06 10*3/uL (ref 0.00–0.07)
Basophils Absolute: 0 10*3/uL (ref 0.0–0.1)
Basophils Relative: 0 %
Eosinophils Absolute: 0 10*3/uL (ref 0.0–0.5)
Eosinophils Relative: 0 %
HCT: 41.6 % (ref 36.0–46.0)
Hemoglobin: 13.6 g/dL (ref 12.0–15.0)
Immature Granulocytes: 0 %
Lymphocytes Relative: 13 %
Lymphs Abs: 2 10*3/uL (ref 0.7–4.0)
MCH: 27.7 pg (ref 26.0–34.0)
MCHC: 32.7 g/dL (ref 30.0–36.0)
MCV: 84.7 fL (ref 80.0–100.0)
Monocytes Absolute: 1 10*3/uL (ref 0.1–1.0)
Monocytes Relative: 6 %
Neutro Abs: 12.2 10*3/uL — ABNORMAL HIGH (ref 1.7–7.7)
Neutrophils Relative %: 81 %
Platelets: 348 10*3/uL (ref 150–400)
RBC: 4.91 MIL/uL (ref 3.87–5.11)
RDW: 13.4 % (ref 11.5–15.5)
WBC: 15.3 10*3/uL — ABNORMAL HIGH (ref 4.0–10.5)
nRBC: 0 % (ref 0.0–0.2)

## 2022-05-01 MED ORDER — EPINEPHRINE 0.3 MG/0.3ML IJ SOAJ: 0.3000 mg | INTRAMUSCULAR | 0 refills | Status: DC | PRN

## 2022-05-01 NOTE — ED Triage Notes (Signed)
Pt BIB EMS for hives and emesis. Pt had multiple episodes of emesis with EMS. No known drug allergies. No differences in food or exposure to allergens. Initial vitals were hypotensive.   Pt received 0.3mg  Epi, 50mg  benadryl, 4mg  zofran with EMS.   EMS Vitals 138/76 HR 90 100%

## 2022-05-01 NOTE — ED Provider Notes (Signed)
[facility] EMERGENCY DEPARTMENT Provider Note  CSN: [id number] Arrival date & time: 05/01/22 1837  Chief Complaint(s) Allergic Reaction  HPI Frances Wilson is a 56 y.o. female with PMH anxiety, ADD, HTN, T2DM who presents the emergency department for evaluation of an allergic reaction.  Patient states that she was driving in her car today when she had sudden onset pruritus of her scalp that radiated down and involves her entire body.  She also had associated nausea and vomiting and was found to be hypotensive in the field.  EMS administered an EpiPen, 50 of Benadryl and Zofran prior to arrival.  Patient states symptoms have resolved after EpiPen administration.  Currently denies chest pain, shortness of breath, Donnell pain, nausea, vomiting, dysphagia or wheezing.   Past Medical History Past Medical History:  Diagnosis Date   Anxiety    [facility]   Attention deficit disorder (ADD)    onset age 80; [facility]   Depression    Diabetes mellitus without complication (HCC) 08/19/2009   Hypertension    Patient Active Problem List   Diagnosis Date Noted   Healthcare maintenance 12/31/2021   Type 2 diabetes mellitus with hyperglycemia (HCC) 08/15/2021   Microscopic hematuria 08/15/2021   Cervical cancer screening 01/03/2021   Encounter for screening mammogram for malignant neoplasm of breast 07/17/2019   Severe episode of recurrent major depressive disorder, without psychotic features (HCC) 05/04/2019   Fungal skin infection 05/04/2019   Uncontrolled diabetes mellitus 09/03/2018   Vaginal irritation 09/03/2018   Screen for colon cancer 06/12/2018   Lung nodule seen on imaging study 04/15/2018   Acute hemorrhagic colitis 04/14/2018   Chronic post-traumatic stress disorder (PTSD) 11/14/2016   Hyperlipidemia 01/22/2016   Anxiety and depression 11/29/2014   Essential hypertension, benign 11/29/2014   Goiter 11/29/2014   ADHD (attention deficit  hyperactivity disorder) 09/23/2014   Home Medication(s) Prior to Admission medications   Medication Sig Start Date End Date Taking? Authorizing Provider  dapagliflozin propanediol (FARXIGA) 10 MG TABS tablet Take 1 tablet by mouth once daily 04/01/22   Glendale Chard, DO  EPINEPHrine 0.3 mg/0.3 mL IJ SOAJ injection Inject 0.3 mg into the muscle as needed for anaphylaxis. 05/01/22  Yes Jorene Kaylor, MD  acetaminophen (TYLENOL) 500 MG tablet Take 2 tablets (1,000 mg total) by mouth every 6 (six) hours as needed. 02/19/22   Carlisle Beers, FNP  aspirin EC 81 MG tablet Take 1 tablet (81 mg total) by mouth daily. 04/30/19   Marthenia Rolling, DO  Dulaglutide (TRULICITY) 1.5 MG/0.5ML SOPN INJECT 1.5 MG SUBCUTANEOUSLY ONCE A WEEK 03/15/22   Glendale Chard, DO  FLUoxetine (PROZAC) 40 MG capsule Take 1 capsule daily. 05/15/21   Celedonio Savage, MD  ibuprofen (ADVIL) 600 MG tablet Take 1 tablet (600 mg total) by mouth every 6 (six) hours as needed. 02/19/22   Carlisle Beers, FNP  lisinopril-hydrochlorothiazide (ZESTORETIC) 20-12.5 MG tablet Take 1 tablet by mouth daily. 02/04/22   Moses Manners, MD  metFORMIN (GLUCOPHAGE-XR) 500 MG 24 hr tablet Take 2 tablets by mouth twice daily 04/25/22   Glendale Chard, DO  Multiple Vitamin (MULTIVITAMIN ADULT PO) Take 1 tablet by mouth daily.    [provider]  rosuvastatin (CRESTOR) 20 MG tablet Take 1 tablet (20 mg total) by mouth daily. 10/15/21   Celedonio Savage, MD  traZODone (DESYREL) 50 MG tablet Take 1 tablet (50 mg total) by mouth at bedtime as needed for sleep. 04/03/21   Celedonio Savage, MD  Past Surgical History Past Surgical History:  Procedure Laterality Date   ABDOMINAL HYSTERECTOMY     ovaries intact; no cancer.   LEG SURGERY     L hip fracture s/p ORIF.    Family History Family History  Problem Relation  Age of Onset   Alcohol abuse Mother    Thyroid disease Mother    Heart disease Sister        AMI secondary to substance abuse   Breast cancer Maternal Grandmother    Pancreatic cancer Maternal Grandfather    Stomach cancer Other        GREAT AUNT   Colon cancer Neg Hx     Social History Social History   Tobacco Use   Smoking status: Former    Packs/day: 1.50    Years: 18.00    Total pack years: 27.00    Types: Cigarettes    Start date: 08/19/1981    Quit date: 12/18/1999    Years since quitting: 22.3   Smokeless tobacco: Never  Vaping Use   Vaping Use: Never used  Substance Use Topics   Alcohol use: Yes    Alcohol/week: 0.0 standard drinks of alcohol    Comment: Occasional use    Drug use: No   Allergies Maxipime [cefepime]  Review of Systems Review of Systems  All other systems reviewed and are negative.   Physical Exam Vital Signs  I have reviewed the triage vital signs BP 105/66   Pulse 89   Temp 98.8 F (37.1 C) (Oral)   Resp 16   Ht 5\' 7"  (1.702 m)   Wt 73.5 kg   SpO2 98%   BMI 25.37 kg/m   Physical Exam Vitals and nursing note reviewed.  Constitutional:      General: She is not in acute distress.    Appearance: She is well-developed.  HENT:     Head: Normocephalic and atraumatic.  Eyes:     Conjunctiva/sclera: Conjunctivae normal.  Cardiovascular:     Rate and Rhythm: Normal rate and regular rhythm.     Heart sounds: No murmur heard. Pulmonary:     Effort: Pulmonary effort is normal. No respiratory distress.     Breath sounds: Normal breath sounds.  Abdominal:     Palpations: Abdomen is soft.     Tenderness: There is no abdominal tenderness.  Musculoskeletal:        General: No swelling.     Cervical back: Neck supple.  Skin:    General: Skin is warm and dry.     Capillary Refill: Capillary refill takes less than 2 seconds.  Neurological:     Mental Status: She is alert.  Psychiatric:        Mood and Affect: Mood normal.     ED  Results and Treatments Labs (all labs ordered are listed, but only abnormal results are displayed) Labs Reviewed  CBC WITH DIFFERENTIAL/PLATELET - Abnormal; Notable for the following components:      Result Value   WBC 15.3 (*)    Neutro Abs 12.2 (*)    All other components within normal limits  COMPREHENSIVE METABOLIC PANEL - Abnormal; Notable for the following components:   Glucose, Bld 169 (*)    All other components within normal limits  Radiology No results found.  Pertinent labs & imaging results that were available during my care of the patient were reviewed by me and considered in my medical decision making (see MDM for details).  Medications Ordered in ED Medications - No data to display                                                                                                                                   Procedures Procedures  (including critical care time)  Medical Decision Making / ED Course   This patient presents to the ED for concern of allergic reaction, this involves an extensive number of treatment options, and is a complaint that carries with it a high risk of complications and morbidity.  The differential diagnosis includes allergic reaction, anaphylaxis, contact dermatitis  MDM: Patient seen emergency room for evaluation of allergic reaction.  Laboratory evaluation with leukocytosis to 15.3 which is likely stress demargination from receiving an EpiPen today.  Physical exam is unremarkable.  Given that the patient received an EpiPen just prior to arrival, she was observed in the emergency department for proximately 4 hours with no return of symptoms.  At this time she is safe for discharge with outpatient allergy follow-up and a prescription was sent to the pharmacy for EpiPen's.   Additional history obtained:  -External records  from outside source obtained and reviewed including: Chart review including previous notes, labs, imaging, consultation notes   Lab Tests: -I ordered, reviewed, and interpreted labs.   The pertinent results include:   Labs Reviewed  CBC WITH DIFFERENTIAL/PLATELET - Abnormal; Notable for the following components:      Result Value   WBC 15.3 (*)    Neutro Abs 12.2 (*)    All other components within normal limits  COMPREHENSIVE METABOLIC PANEL - Abnormal; Notable for the following components:   Glucose, Bld 169 (*)    All other components within normal limits      EKG   EKG Interpretation  Date/Time:  Wednesday May 01 2022 18:46:22 EDT Ventricular Rate:  89 PR Interval:  160 QRS Duration: 92 QT Interval:  394 QTC Calculation: 480 R Axis:   31 Text Interpretation: Sinus rhythm Confirmed by Syana Degraffenreid ([id number]) on 05/01/2022 11:24:34 PM          Medicines ordered and prescription drug management: Meds ordered this encounter  Medications   EPINEPHrine 0.3 mg/0.3 mL IJ SOAJ injection    Sig: Inject 0.3 mg into the muscle as needed for anaphylaxis.    Dispense:  1 each    Refill:  0    -I have reviewed the patients home medicines and have made adjustments as needed  Critical interventions none    Cardiac Monitoring: The patient was maintained on a cardiac monitor.  I personally viewed and interpreted the cardiac monitored which showed an underlying rhythm of: NSR  Social Determinants of Health:  Factors impacting patients care  include: none   Reevaluation: After the interventions noted above, I reevaluated the patient and found that they have :improved  Co morbidities that complicate the patient evaluation  Past Medical History:  Diagnosis Date   Anxiety    [facility]   Attention deficit disorder (ADD)    onset age 76; [facility]   Depression    Diabetes mellitus without complication (HCC) 08/19/2009   Hypertension        Dispostion: I considered admission for this patient, but she does not meet inpatient criteria for admission she is safe for discharge with outpatient follow-up     Final Clinical Impression(s) / ED Diagnoses Final diagnoses:  Allergic reaction, initial encounter     @PCDICTATION @    , MD 05/01/22 2326

## 2022-06-29 ENCOUNTER — Other Ambulatory Visit: Payer: Self-pay | Admitting: Student

## 2022-06-29 DIAGNOSIS — E119 Type 2 diabetes mellitus without complications: Secondary | ICD-10-CM

## 2022-07-21 ENCOUNTER — Ambulatory Visit (HOSPITAL_COMMUNITY)
Admission: EM | Admit: 2022-07-21 | Discharge: 2022-07-21 | Disposition: A | Discharge status: Home / Self Care | Payer: Self-pay | Attending: Family Medicine | Admitting: Family Medicine

## 2022-07-21 ENCOUNTER — Encounter (HOSPITAL_COMMUNITY): Payer: Self-pay

## 2022-07-21 DIAGNOSIS — N3090 Cystitis, unspecified without hematuria: Secondary | ICD-10-CM

## 2022-07-21 LAB — POCT URINALYSIS DIPSTICK, ED / UC
Bilirubin Urine: NEGATIVE
Glucose, UA: >=1000 mg/dL — AB
Ketones, ur: NEGATIVE mg/dL
Leukocytes,Ua: NEGATIVE
Nitrite: NEGATIVE
Protein, ur: NEGATIVE mg/dL
Specific Gravity, Urine: 1.015 (ref 1.005–1.030)
Urobilinogen, UA: 0.2 mg/dL (ref 0.0–1.0)
pH: 5 (ref 5.0–8.0)

## 2022-07-21 MED ORDER — NITROFURANTOIN MONOHYD MACRO 100 MG PO CAPS: 100.0000 mg | ORAL_CAPSULE | Freq: Two times a day (BID) | ORAL | 0 refills | Status: AC

## 2022-07-21 NOTE — Discharge Instructions (Addendum)
The urinalysis showed some blood, which probably indicates a urinary infection  Take nitrofurantoin 100 mg--1 capsule 2 times daily for 7 days  Call your primary care about needing to adjust your medication, since she cannot fill the Trulicity for now

## 2022-07-21 NOTE — ED Triage Notes (Signed)
Pt is fos possible uti x 5days

## 2022-07-21 NOTE — ED Provider Notes (Signed)
[facility]    CSN: [id number] Arrival date & time: 07/21/22  1409      History   Chief Complaint Chief Complaint  Patient presents with   Dysuria    HPI Frances Wilson is a 56 y.o. female.    Dysuria  Here for dysuria and urinary frequency.  Symptoms have been going on for over a week.  No fever or chills or nausea or vomiting.  She does have a history of diabetes and is still taking her [place] and metformin, but has been off her Trulicity.  She is not taking her Trulicity due to insurance.  She is not checking her sugars  Past Medical History:  Diagnosis Date   Anxiety    [facility]   Attention deficit disorder (ADD)    onset age 62; [facility]   Depression    Diabetes mellitus without complication (HCC) 08/19/2009   Hypertension     Patient Active Problem List   Diagnosis Date Noted   Healthcare maintenance 12/31/2021   Type 2 diabetes mellitus with hyperglycemia (HCC) 08/15/2021   Microscopic hematuria 08/15/2021   Cervical cancer screening 01/03/2021   Encounter for screening mammogram for malignant neoplasm of breast 07/17/2019   Severe episode of recurrent major depressive disorder, without psychotic features (HCC) 05/04/2019   Fungal skin infection 05/04/2019   Uncontrolled diabetes mellitus 09/03/2018   Vaginal irritation 09/03/2018   Screen for colon cancer 06/12/2018   Lung nodule seen on imaging study 04/15/2018   Acute hemorrhagic colitis 04/14/2018   Chronic post-traumatic stress disorder (PTSD) 11/14/2016   Hyperlipidemia 01/22/2016   Anxiety and depression 11/29/2014   Essential hypertension, benign 11/29/2014   Goiter 11/29/2014   ADHD (attention deficit hyperactivity disorder) 09/23/2014    Past Surgical History:  Procedure Laterality Date   ABDOMINAL HYSTERECTOMY     ovaries intact; no cancer.   LEG SURGERY     L hip fracture s/p ORIF.     OB History   No obstetric history on file.      Home  Medications    Prior to Admission medications   Medication Sig Start Date End Date Taking? Authorizing Provider  dapagliflozin propanediol (FARXIGA) 10 MG TABS tablet Take 1 tablet by mouth once daily 04/01/22   Glendale Chard, DO  nitrofurantoin, macrocrystal-monohydrate, (MACROBID) 100 MG capsule Take 1 capsule (100 mg total) by mouth 2 (two) times daily for 7 days. 07/21/22 07/28/22 Yes Deeann Servidio, Janace Aris, MD  acetaminophen (TYLENOL) 500 MG tablet Take 2 tablets (1,000 mg total) by mouth every 6 (six) hours as needed. 02/19/22   Carlisle Beers, FNP  aspirin EC 81 MG tablet Take 1 tablet (81 mg total) by mouth daily. 04/30/19   Marthenia Rolling, DO  Dulaglutide (TRULICITY) 1.5 MG/0.5ML SOPN INJECT 1.5 MG SUBCUTANEOUSLY ONCE A WEEK 03/15/22   Glendale Chard, DO  EPINEPHrine 0.3 mg/0.3 mL IJ SOAJ injection Inject 0.3 mg into the muscle as needed for anaphylaxis. 05/01/22   Kommor, [place], MD  FLUoxetine (PROZAC) 40 MG capsule Take 1 capsule daily. 05/15/21   Celedonio Savage, MD  lisinopril-hydrochlorothiazide (ZESTORETIC) 20-12.5 MG tablet Take 1 tablet by mouth daily. 02/04/22   Moses Manners, MD  metFORMIN (GLUCOPHAGE-XR) 500 MG 24 hr tablet Take 2 tablets by mouth twice daily 07/01/22   Glendale Chard, DO  Multiple Vitamin (MULTIVITAMIN ADULT PO) Take 1 tablet by mouth daily.    [provider]  rosuvastatin (CRESTOR) 20 MG tablet Take 1 tablet (20 mg total)  by mouth daily. 10/15/21   Celedonio Savage, MD  traZODone (DESYREL) 50 MG tablet Take 1 tablet (50 mg total) by mouth at bedtime as needed for sleep. 04/03/21   Celedonio Savage, MD    Family History Family History  Problem Relation Age of Onset   Alcohol abuse Mother    Thyroid disease Mother    Heart disease Sister        AMI secondary to substance abuse   Breast cancer Maternal Grandmother    Pancreatic cancer Maternal Grandfather    Stomach cancer Other        GREAT AUNT   Colon cancer Neg Hx     Social History Social  History   Tobacco Use   Smoking status: Former    Packs/day: 1.50    Years: 18.00    Total pack years: 27.00    Types: Cigarettes    Start date: 08/19/1981    Quit date: 12/18/1999    Years since quitting: 22.6   Smokeless tobacco: Never  Vaping Use   Vaping Use: Never used  Substance Use Topics   Alcohol use: Yes    Alcohol/week: 0.0 standard drinks of alcohol    Comment: Occasional use    Drug use: No     Allergies   Maxipime [cefepime]   Review of Systems Review of Systems  Genitourinary:  Positive for dysuria.     Physical Exam Triage Vital Signs ED Triage Vitals  Enc Vitals Group     BP 07/21/22 1716 139/84     Pulse Rate 07/21/22 1716 66     Resp 07/21/22 1716 12     Temp 07/21/22 1716 98.3 F (36.8 C)     Temp Source 07/21/22 1716 Oral     SpO2 07/21/22 1716 99 %     Weight --      Height --      Head Circumference --      Peak Flow --      Pain Score 07/21/22 1714 6     Pain Loc --      Pain Edu? --      Excl. in GC? --    No data found.  Updated Vital Signs BP 139/84   Pulse 66   Temp 98.3 F (36.8 C) (Oral)   Resp 12   SpO2 99%   Visual Acuity Right Eye Distance:   Left Eye Distance:   Bilateral Distance:    Right Eye Near:   Left Eye Near:    Bilateral Near:     Physical Exam Vitals reviewed.  Constitutional:      General: She is not in acute distress.    Appearance: She is not ill-appearing, toxic-appearing or diaphoretic.  HENT:     Mouth/Throat:     Mouth: Mucous membranes are moist.  Cardiovascular:     Rate and Rhythm: Normal rate and regular rhythm.     Heart sounds: No murmur heard. Pulmonary:     Effort: Pulmonary effort is normal.     Breath sounds: Normal breath sounds.  Skin:    Coloration: Skin is not jaundiced or pale.  Neurological:     Mental Status: She is alert and oriented to person, place, and time.  Psychiatric:        Behavior: Behavior normal.      UC Treatments / Results  Labs (all labs  ordered are listed, but only abnormal results are displayed) Labs Reviewed  POCT URINALYSIS DIPSTICK, ED /  UC - Abnormal; Notable for the following components:      Result Value   Glucose, UA >=1000 (*)    Hgb urine dipstick MODERATE (*)    All other components within normal limits    EKG   Radiology No results found.  Procedures Procedures (including critical care time)  Medications Ordered in UC Medications - No data to display  Initial Impression / Assessment and Plan / UC Course  I have reviewed the triage vital signs and the nursing notes.  Pertinent labs & imaging results that were available during my care of the patient were reviewed by me and considered in my medical decision making (see chart for details).        Her UA shows moderate hemoglobin.  She does have over 1000 mg percent of sugar, but she is taking [place].  Her to contact her primary care, so they can adjust medications since she cannot get Trulicity until in January when she has new insurance Final Clinical Impressions(s) / UC Diagnoses   Final diagnoses:  Cystitis     Discharge Instructions      The urinalysis showed some blood, which probably indicates a urinary infection  Take nitrofurantoin 100 mg--1 capsule 2 times daily for 7 days  Call your primary care about needing to adjust your medication, since she cannot fill the Trulicity for now      ED Prescriptions     Medication Sig Dispense Auth. Provider   nitrofurantoin, macrocrystal-monohydrate, (MACROBID) 100 MG capsule Take 1 capsule (100 mg total) by mouth 2 (two) times daily for 7 days. 14 capsule Marlinda Mike, Janace Aris, MD      PDMP not reviewed this encounter.   Zenia Resides, MD 07/21/22 [id number]

## 2022-08-23 ENCOUNTER — Ambulatory Visit (INDEPENDENT_AMBULATORY_CARE_PROVIDER_SITE_OTHER): Payer: Self-pay | Admitting: Family Medicine

## 2022-08-23 ENCOUNTER — Encounter: Payer: Self-pay | Admitting: Family Medicine

## 2022-08-23 VITALS — BP 116/74 | HR 77 | Ht 67.0 in | Wt 172.5 lb

## 2022-08-23 DIAGNOSIS — F419 Anxiety disorder, unspecified: Secondary | ICD-10-CM

## 2022-08-23 DIAGNOSIS — F32A Depression, unspecified: Secondary | ICD-10-CM

## 2022-08-23 DIAGNOSIS — E1165 Type 2 diabetes mellitus with hyperglycemia: Secondary | ICD-10-CM

## 2022-08-23 DIAGNOSIS — Z23 Encounter for immunization: Secondary | ICD-10-CM

## 2022-08-23 LAB — POCT GLYCOSYLATED HEMOGLOBIN (HGB A1C): HbA1c, POC (controlled diabetic range): 12.6 % — AB (ref 0.0–7.0)

## 2022-08-23 MED ORDER — TRULICITY 1.5 MG/0.5ML ~~LOC~~ SOAJ: SUBCUTANEOUS | 3 refills | Status: DC

## 2022-08-23 MED ORDER — TRULICITY 1.5 MG/0.5ML ~~LOC~~ SOAJ: SUBCUTANEOUS | 0 refills | Status: DC

## 2022-08-23 MED ORDER — DAPAGLIFLOZIN PROPANEDIOL 10 MG PO TABS: 10.0000 mg | ORAL_TABLET | Freq: Every day | ORAL | 0 refills | Status: DC

## 2022-08-23 MED ORDER — DAPAGLIFLOZIN PROPANEDIOL 10 MG PO TABS: 10.0000 mg | ORAL_TABLET | Freq: Every day | ORAL | 1 refills | Status: DC

## 2022-08-23 NOTE — Patient Instructions (Signed)
It was great to see you! Thank you for allowing me to participate in your care!  I recommend that you always bring your medications to each appointment as this makes it easy to ensure we are on the correct medications and helps [place] not miss when refills are needed.  Our plans for today:  -Please follow-up in 1 month.  Please check your sugars at least twice a week and bring in with you.  It is very important we get your blood sugars under control or we may have to start insulin. -I have refilled your Trulicity and Farxiga sent to your pharmacy please take your insurance card there so the will be able to fill it.  If you are unable to fill it please make an appointment for earlier than 1 month so that we can discuss starting insulin.    Take care and seek immediate care sooner if you develop any concerns.   Dr. Salvadore Oxford, MD [place]

## 2022-08-23 NOTE — Progress Notes (Unsigned)
    SUBJECTIVE:   CHIEF COMPLAINT / HPI: diabetes follow-up  Diabetic Follow Up: Patient is a 57 y.o. female who present today for diabetic follow up.   Patient endorses difficulties affording medications, but able get insurance.  Home medications include: Wilder Glade, Trulicity, Metformin Patient last able to take [place] this week, still taking Metformin, has not had trulicity.  Most recent A1Cs:  Lab Results  Component Value Date   HGBA1C 12.6 (A) 08/23/2022   HGBA1C 9.0 (A) 12/27/2021   HGBA1C 8.4 (A) 08/15/2021   Last Microalbumin, LDL, Creatinine: Lab Results  Component Value Date   MICROALBUR 30 04/30/2019   [place] 95 12/27/2021   CREATININE 0.88 05/01/2022   Patient does not check blood glucose on a regular basis.  Patient is up to date on diabetic eye, states she did couple of months ago. Patient is not up to date on diabetic foot exam.  Anxiety/Depression - Patient states she is not taking Prozac, does not wish to restart. States her mood is okay and that she thinks a lot of it due to ADHD but she stopped going to her psychiatric provider who she was seeing virtually throughout [place] because he was a "pill pusher."  PERTINENT  PMH / PSH: HTN, T2DM  OBJECTIVE:   BP 116/74   Pulse 77   Ht 5\' 7"  (1.702 m)   Wt 172 lb 8 oz (78.2 kg)   SpO2 100%   BMI 27.02 kg/m   General: NAD  Respiratory: normal WOB on RA Extremities: Moving all 4 extremities equally  Foot exam: No deformities, ulcerations, or other skin breakdown on feet bilaterally.  Sensation intact to monofilament and light touch.  PT and DP pulses intact BL.     ASSESSMENT/PLAN:   Uncontrolled type 2 diabetes mellitus with hyperglycemia (HCC) Assessment & Plan: A1c today 12.6 from 9.0 25months ago. Patient reports being out of Trulicity for several months due to switching insurances. Discussed that we can trial restarting Trulicity and monitor sugars for follow-up in month. Patient refuses to measure  sugars more than twice a week. Discussed if reported sugars are not with goal range it is likely that we will have to start patient on insulin. Patient expressed understanding of this plan.  Orders: -     POCT glycosylated hemoglobin (Hb A1C) -     Microalbumin / creatinine urine ratio -     Dapagliflozin Propanediol; Take 1 tablet (10 mg total) by mouth daily.  Dispense: 30 tablet; Refill: 0 -     Trulicity; INJECT 1.5 MG SUBCUTANEOUSLY ONCE A WEEK  Dispense: 4 mL; Refill: 0  Encounter for immunization [employer] Fall 2023 Covid-19 Vaccine 64yrs and older  Anxiety and depression Assessment & Plan: Patient PHQ-9 21, not currently on Prozac and patient does not wish to restart. Patient has history of insomnia and ADHD make PHQ9 not representative of current anxiety and depression. Feels like her mood is okay. Negative question 9. Continue to discuss at follow-up.    Return in about 1 month (around 09/23/2022).    Salvadore Oxford, MD [place]

## 2022-08-24 NOTE — Assessment & Plan Note (Addendum)
Patient PHQ-9 21, not currently on Prozac and patient does not wish to restart. Patient has history of insomnia and ADHD make PHQ9 not representative of current anxiety and depression. Feels like her mood is okay. Negative question 9. Continue to discuss at follow-up.

## 2022-08-24 NOTE — Assessment & Plan Note (Signed)
A1c today 12.6 from 9.0 25months ago. Patient reports being out of Trulicity for several months due to switching insurances. Discussed that we can trial restarting Trulicity and monitor sugars for follow-up in month. Patient refuses to measure sugars more than twice a week. Discussed if reported sugars are not with goal range it is likely that we will have to start patient on insulin. Patient expressed understanding of this plan.

## 2022-08-25 LAB — MICROALBUMIN / CREATININE URINE RATIO
Creatinine, Urine: 51.8 mg/dL
Microalb/Creat Ratio: 23 mg/g creat (ref 0–29)
Microalbumin, Urine: 11.8 ug/mL

## 2022-08-27 ENCOUNTER — Telehealth: Payer: Self-pay

## 2022-08-27 ENCOUNTER — Other Ambulatory Visit (HOSPITAL_COMMUNITY): Payer: Self-pay

## 2022-08-27 NOTE — Telephone Encounter (Signed)
A Prior Authorization was initiated for this patients TRULICITY through CoverMyMeds.   Key: [id number]

## 2022-08-27 NOTE — Telephone Encounter (Signed)
Prior Auth for patients medication TRULICITY approved by [facility] [place] [facility] from 08/27/22 to 08/27/25.  Key: [id number]

## 2022-09-23 ENCOUNTER — Ambulatory Visit: Payer: BC Managed Care – PPO | Admitting: Student

## 2022-09-23 ENCOUNTER — Encounter: Payer: Self-pay | Admitting: Student

## 2022-09-23 VITALS — BP 128/78 | HR 84 | Ht 67.0 in | Wt 171.0 lb

## 2022-09-23 DIAGNOSIS — E119 Type 2 diabetes mellitus without complications: Secondary | ICD-10-CM | POA: Diagnosis not present

## 2022-09-23 DIAGNOSIS — E1165 Type 2 diabetes mellitus with hyperglycemia: Secondary | ICD-10-CM

## 2022-09-23 DIAGNOSIS — F4312 Post-traumatic stress disorder, chronic: Secondary | ICD-10-CM | POA: Diagnosis not present

## 2022-09-23 DIAGNOSIS — F419 Anxiety disorder, unspecified: Secondary | ICD-10-CM

## 2022-09-23 DIAGNOSIS — I10 Essential (primary) hypertension: Secondary | ICD-10-CM

## 2022-09-23 DIAGNOSIS — F32A Depression, unspecified: Secondary | ICD-10-CM

## 2022-09-23 DIAGNOSIS — E785 Hyperlipidemia, unspecified: Secondary | ICD-10-CM

## 2022-09-23 MED ORDER — DAPAGLIFLOZIN PROPANEDIOL 10 MG PO TABS: 10.0000 mg | ORAL_TABLET | Freq: Every day | ORAL | 0 refills | Status: DC

## 2022-09-23 MED ORDER — TRAZODONE HCL 50 MG PO TABS: 50.0000 mg | ORAL_TABLET | Freq: Every evening | ORAL | 1 refills | Status: DC | PRN

## 2022-09-23 MED ORDER — ROSUVASTATIN CALCIUM 20 MG PO TABS: 20.0000 mg | ORAL_TABLET | Freq: Every day | ORAL | 3 refills | Status: DC

## 2022-09-23 MED ORDER — VENLAFAXINE HCL ER 37.5 MG PO CP24: 37.5000 mg | ORAL_CAPSULE | Freq: Every day | ORAL | 0 refills | Status: DC

## 2022-09-23 MED ORDER — TRULICITY 1.5 MG/0.5ML ~~LOC~~ SOAJ: 3.0000 mg | SUBCUTANEOUS | 0 refills | Status: DC

## 2022-09-23 MED ORDER — METFORMIN HCL ER 500 MG PO TB24: 1000.0000 mg | ORAL_TABLET | Freq: Two times a day (BID) | ORAL | 0 refills | Status: DC

## 2022-09-23 NOTE — Patient Instructions (Signed)
It was great to see you today!   Today we addressed: Depression/Anxiety/ADHD: I am starting you on a new medication called Effexor. I am starting you on the lowest dose that we can increase as needed.  Diabetes: I am increasing your Trulicity to 3 mg weekly, we will recheck your A1c at the next visit.   You should return to our clinic Return in about 31 days (around 10/24/2022). At 4:10 PM for follow up.   Please arrive 15 minutes before your appointment to ensure smooth check in process.    Please call the clinic at [phone] if your symptoms worsen or you have any concerns.  Thank you for allowing me to participate in your care, Dr. Darci [facility]

## 2022-09-23 NOTE — Progress Notes (Signed)
    SUBJECTIVE:   CHIEF COMPLAINT / HPI:   Frances Wilson is a 57 y.o. female  presenting for follow up for chronic conditions.   T2DM:  Meds: Farxiga, Trulicity 1.5 mg 1x/wk  Home sugars: fasting 180-190s, does not check sugars daily Eye Exam: UTD, next due 03/2023 Last A1C: 12.6 on 08/23/22  HLD:  Lipid panel checked 1 year ago, LDL 95. Continued on Crestor 20 mg without problems.   HTN:  Has been off medication for several months.   Anxiety/Depression/PTSD/ADHD:  Has been struggling with focus and anxiety.  Has previously been on Prozac but reports she has not felt great effect from medication.  Denies prior history of mania.   PERTINENT  PMH / PSH: HTN, T2DM, HLD, PTSD, Anxiety, Depression   OBJECTIVE:   BP 128/78   Pulse 84   Ht 5\' 7"  (1.702 m)   Wt 171 lb (77.6 kg)   SpO2 99%   BMI 26.78 kg/m   Well-appearing, no acute distress Cardio: Regular rate, regular rhythm, no murmurs on exam. Pulm: Clear, no wheezing, no crackles. No increased work of breathing Abdominal: bowel sounds present, soft, non-tender, non-distended Extremities: no peripheral edema    PHQ-9: 15  ASSESSMENT/PLAN:   Type 2 diabetes mellitus with hyperglycemia ([place]) Patient is still not meeting weight loss and glycemic control with 1.5 mg Trulicity weekly.  Will titrate up for better response. - Continue metformin - Continue Farxiga - increase Trulicity to 3 mg  - recheck A1c at next visit  - check BMP 03/2023  Anxiety and depression With lack of response to SSRI (Prozac), will start SNRI Effexor and monitor for response to therapy.  Discussed with patient the risk and benefit of starting lorazepam and ADHD medicine.  Patient understands long-term risk of harm and risk of addiction with starting a benzodiazepine. - Start Effexor XR 37.5 mg daily - Appointment scheduled in 4 weeks for follow-up - Consider stopping trazodone to reduce risk of serotonin syndrome  Essential hypertension,  benign Appears to be well-controlled without medication. - Monitor blood pressure at next office visit  Hyperlipidemia Cholesterol panel checked 12/2021 - Refilled Crestor - Check lipid panel 03/2023    Darci Current, DO [place]

## 2022-09-23 NOTE — Assessment & Plan Note (Signed)
Appears to be well-controlled without medication. - Monitor blood pressure at next office visit

## 2022-09-23 NOTE — Assessment & Plan Note (Addendum)
Patient is still not meeting weight loss and glycemic control with 1.5 mg Trulicity weekly.  Will titrate up for better response. - Continue metformin - Continue Farxiga - increase Trulicity to 3 mg  - recheck A1c at next visit  - check BMP 03/2023

## 2022-09-23 NOTE — Assessment & Plan Note (Signed)
Cholesterol panel checked 12/2021 - Refilled Crestor - Check lipid panel 03/2023

## 2022-09-23 NOTE — Assessment & Plan Note (Signed)
With lack of response to SSRI (Prozac), will start SNRI Effexor and monitor for response to therapy.  Discussed with patient the risk and benefit of starting lorazepam and ADHD medicine.  Patient understands long-term risk of harm and risk of addiction with starting a benzodiazepine. - Start Effexor XR 37.5 mg daily - Appointment scheduled in 4 weeks for follow-up - Consider stopping trazodone to reduce risk of serotonin syndrome

## 2022-10-23 ENCOUNTER — Other Ambulatory Visit: Payer: Self-pay | Admitting: Student

## 2022-10-23 DIAGNOSIS — E119 Type 2 diabetes mellitus without complications: Secondary | ICD-10-CM

## 2022-10-24 ENCOUNTER — Ambulatory Visit: Payer: BC Managed Care – PPO | Admitting: Student

## 2022-10-24 ENCOUNTER — Other Ambulatory Visit (HOSPITAL_COMMUNITY): Payer: Self-pay

## 2022-10-24 ENCOUNTER — Encounter: Payer: Self-pay | Admitting: Student

## 2022-10-24 DIAGNOSIS — E1165 Type 2 diabetes mellitus with hyperglycemia: Secondary | ICD-10-CM

## 2022-10-24 DIAGNOSIS — F32A Depression, unspecified: Secondary | ICD-10-CM

## 2022-10-24 DIAGNOSIS — F4312 Post-traumatic stress disorder, chronic: Secondary | ICD-10-CM | POA: Diagnosis not present

## 2022-10-24 DIAGNOSIS — E119 Type 2 diabetes mellitus without complications: Secondary | ICD-10-CM

## 2022-10-24 DIAGNOSIS — F419 Anxiety disorder, unspecified: Secondary | ICD-10-CM

## 2022-10-24 MED ORDER — ROSUVASTATIN CALCIUM 20 MG PO TABS: 20.0000 mg | ORAL_TABLET | Freq: Every day | ORAL | 3 refills | Status: DC

## 2022-10-24 MED ORDER — DAPAGLIFLOZIN PROPANEDIOL 10 MG PO TABS: 10.0000 mg | ORAL_TABLET | Freq: Every day | ORAL | 0 refills | Status: DC

## 2022-10-24 MED ORDER — VENLAFAXINE HCL ER 37.5 MG PO CP24: 37.5000 mg | ORAL_CAPSULE | Freq: Every day | ORAL | 0 refills | Status: DC

## 2022-10-24 MED ORDER — METFORMIN HCL ER 500 MG PO TB24: 1000.0000 mg | ORAL_TABLET | Freq: Two times a day (BID) | ORAL | 0 refills | Status: DC

## 2022-10-24 MED ORDER — TRULICITY 1.5 MG/0.5ML ~~LOC~~ SOAJ
1.5000 mg | SUBCUTANEOUS | 0 refills | Status: DC
  Filled 2022-10-24 (×2): qty 2, 28d supply, fill #0

## 2022-10-24 NOTE — Progress Notes (Addendum)
    SUBJECTIVE:   CHIEF COMPLAINT / HPI:   Frances Wilson is a 57 y.o. female  presenting for follow up for her diabetes and depression.  Diabetes: She ran out of her Trulicity and her pharmacy was unable to refill it due to shortages.  She had previously been stable on 1.5 mg weekly.  Her A1c has gone back up to greater than 12 at this last visit due to being out of Trulicity.  She has been taking her metformin and [place] daily.  She denies severe polydipsia, increased urinary frequency.  She is a candidate for pharmacy CGM study.  Depression: She was started on Effexor at last visit.  She reports great response and feeling very well and controlled on this medication.  She does not feel the need to titrate this at this time.  PERTINENT  PMH / PSH: Reviewed   OBJECTIVE:   BP 124/64   Pulse 88   Ht '5\' 7"'$  (1.702 m)   Wt 168 lb (76.2 kg)   SpO2 98%   BMI 26.31 kg/m   Well-appearing, no acute distress Cardio: Regular rate, regular rhythm, no murmurs on exam. Pulm: Clear, no wheezing, no crackles. No increased work of breathing Abdominal: bowel sounds present, soft, non-tender, non-distended Extremities: no peripheral edema    ASSESSMENT/PLAN:   Type 2 diabetes mellitus with hyperglycemia ([place]) -Sent Trulicity to [facility] as they had box in stock -Follow-up in 2 months for A1c check and refill Trulicity -Continue talking about entering pharmacy study for CGM  Anxiety and depression Much improved on the Effexor.  Continue at current dose and titrate as needed     Darci Current, [place]

## 2022-10-24 NOTE — Assessment & Plan Note (Signed)
-  Sent Trulicity to [facility] as they had box in stock -Follow-up in 2 months for A1c check and refill Trulicity -Continue talking about entering pharmacy study for CGM

## 2022-10-24 NOTE — Assessment & Plan Note (Signed)
Much improved on the Effexor.  Continue at current dose and titrate as needed

## 2022-10-24 NOTE — Patient Instructions (Signed)
It was great to see you today!   Today we addressed: Your mood, I am so glad that the Effexor is helping you.  We will keep it at the same dose.  If you start feeling like you need more help please let me know and we can always increase this if you need. I sent in a prescription of Trulicity 1.5 mg to the [facility] pharmacy this is located on [address] should return to our clinic Return in about 3 months (around 01/24/2023).  Please arrive 15 minutes before your appointment to ensure smooth check in process.    Please call the clinic at [phone] if your symptoms worsen or you have any concerns.  Thank you for allowing me to participate in your care, Dr. Darci Current [facility]

## 2022-11-06 ENCOUNTER — Ambulatory Visit (INDEPENDENT_AMBULATORY_CARE_PROVIDER_SITE_OTHER): Payer: BC Managed Care – PPO

## 2022-11-06 ENCOUNTER — Ambulatory Visit (HOSPITAL_COMMUNITY)
Admission: EM | Admit: 2022-11-06 | Discharge: 2022-11-06 | Disposition: A | Discharge status: Home / Self Care | Payer: BC Managed Care – PPO | Attending: Emergency Medicine | Admitting: Emergency Medicine

## 2022-11-06 ENCOUNTER — Encounter (HOSPITAL_COMMUNITY): Payer: Self-pay

## 2022-11-06 DIAGNOSIS — E1165 Type 2 diabetes mellitus with hyperglycemia: Secondary | ICD-10-CM

## 2022-11-06 DIAGNOSIS — R519 Headache, unspecified: Secondary | ICD-10-CM | POA: Diagnosis not present

## 2022-11-06 DIAGNOSIS — M25511 Pain in right shoulder: Secondary | ICD-10-CM

## 2022-11-06 LAB — POCT URINALYSIS DIPSTICK, ED / UC
Bilirubin Urine: NEGATIVE
Glucose, UA: >=1000 mg/dL — AB
Leukocytes,Ua: NEGATIVE
Nitrite: NEGATIVE
Protein, ur: NEGATIVE mg/dL
Specific Gravity, Urine: 1.02 (ref 1.005–1.030)
Urobilinogen, UA: 0.2 mg/dL (ref 0.0–1.0)
pH: 5 (ref 5.0–8.0)

## 2022-11-06 LAB — CBG MONITORING, ED: Glucose-Capillary: 132 mg/dL — ABNORMAL HIGH (ref 70–99)

## 2022-11-06 NOTE — Discharge Instructions (Addendum)
Your shoulder imaging was negative for fracture or dislocation.  You can continue to use heat or ice, please call Raliegh Ip tomorrow and schedule follow-up appointment for further evaluation.  Please go home and get some rest, if your headache persists, you can take Tylenol or ibuprofen.  Please seek immediate care if you develop the worst headache of your life, changes in vision, nausea, emesis or worsening of symptoms.  Your urine today had glucose and trace ketones. Please follow-up with your primary care in regards to your diabetes treatment.

## 2022-11-06 NOTE — ED Provider Notes (Signed)
[place]    CSN: [id number] Arrival date & time: 11/06/22  1532      History   Chief Complaint Chief Complaint  Patient presents with   Headache   Shoulder Pain    HPI Frances Wilson is a 57 y.o. female.   Reports right shoulder pain ongoing for the past month, went to see a provider who recommended heat, which she has been doing daily. Patient reports limitations to ROM to her right shoulder, feels like her pain is radiating.Noticed one day she had trouble lifting her bad from the passenger seat.   Today she had a headache, denies current photophobia, headache is now nagging, has not taken anything for this. Pain behind right eye and her left temporal area. Reports headache is improving.   Was out of work for the last two days due to just feeling unwell. Recent A1C was 12.4 on 08/23/22 after pharmacy was out of her Trulicity. Reports increased urination. Denies dysuria.   The history is provided by the patient and medical records.  Headache Associated symptoms: no abdominal pain, no cough, no fatigue, no fever, no numbness, no photophobia, no sore throat and no weakness   Shoulder Pain Associated symptoms: no fatigue and no fever     History reviewed. No pertinent past medical history.  Patient Active Problem List   Diagnosis Date Noted   Type 2 diabetes mellitus with hyperglycemia ([place]) 08/15/2021   Microscopic hematuria 08/15/2021   Severe episode of recurrent major depressive disorder, without psychotic features ([place]) 05/04/2019   Fungal skin infection 05/04/2019   Lung nodule seen on imaging study 04/15/2018   Acute hemorrhagic colitis 04/14/2018   Chronic post-traumatic stress disorder (PTSD) 11/14/2016   Hyperlipidemia 01/22/2016   Anxiety and depression 11/29/2014   Essential hypertension, benign 11/29/2014   Goiter 11/29/2014   ADHD (attention deficit hyperactivity disorder) 09/23/2014    Past Surgical History:  Procedure Laterality Date    ABDOMINAL HYSTERECTOMY     ovaries intact; no cancer.   LEG SURGERY     L hip fracture s/p ORIF.     OB History   No obstetric history on file.      Home Medications    Prior to Admission medications   Medication Sig Start Date End Date Taking? Authorizing Provider  aspirin EC 81 MG tablet Take 1 tablet (81 mg total) by mouth daily. 04/30/19  Yes Sherene Sires, DO  dapagliflozin propanediol (FARXIGA) 10 MG TABS tablet Take 1 tablet (10 mg total) by mouth daily. 10/24/22  Yes Darci Current, DO  Dulaglutide (TRULICITY) 1.5 0000000 SOPN Inject 1.5 mg into the muscle once a week. 10/24/22  Yes Darci Current, DO  EPINEPHrine 0.3 mg/0.3 mL IJ SOAJ injection Inject 0.3 mg into the muscle as needed for anaphylaxis. 05/01/22  Yes Kommor, [place], MD  metFORMIN (GLUCOPHAGE-XR) 500 MG 24 hr tablet Take 2 tablets (1,000 mg total) by mouth 2 (two) times daily. 10/24/22  Yes Darci Current, DO  Multiple Vitamin (MULTIVITAMIN ADULT PO) Take 1 tablet by mouth daily.   Yes [provider]  rosuvastatin (CRESTOR) 20 MG tablet Take 1 tablet (20 mg total) by mouth daily. 10/24/22  Yes Darci Current, DO  traZODone (DESYREL) 50 MG tablet Take 1 tablet (50 mg total) by mouth at bedtime as needed for sleep. 09/23/22  Yes Darci Current, DO  venlafaxine XR (EFFEXOR XR) 37.5 MG 24 hr capsule Take 1 capsule (37.5 mg total) by mouth daily with breakfast. 10/24/22  Yes Sabra Heck,  Raquel Sarna, DO    Family History Family History  Problem Relation Age of Onset   Alcohol abuse Mother    Thyroid disease Mother    Heart disease Sister        AMI secondary to substance abuse   Breast cancer Maternal Grandmother    Pancreatic cancer Maternal Grandfather    Stomach cancer Other        GREAT AUNT   Colon cancer Neg Hx     Social History Social History   Tobacco Use   Smoking status: Former    Packs/day: 1.50    Years: 18.00    Additional pack years: 0.00    Total pack years: 27.00    Types: Cigarettes    Start date:  08/19/1981    Quit date: 12/18/1999    Years since quitting: 22.9   Smokeless tobacco: Never  Vaping Use   Vaping Use: Never used  Substance Use Topics   Alcohol use: Yes    Alcohol/week: 0.0 standard drinks of alcohol    Comment: Occasional use    Drug use: No     Allergies   Maxipime [cefepime]   Review of Systems Review of Systems  Constitutional:  Negative for fatigue and fever.  HENT:  Negative for sore throat.   Eyes:  Negative for photophobia and visual disturbance.  Respiratory:  Negative for cough and shortness of breath.   Cardiovascular:  Negative for chest pain.  Gastrointestinal:  Negative for abdominal pain.  Genitourinary:  Positive for urgency.  Musculoskeletal:  Positive for arthralgias.  Neurological:  Positive for headaches. Negative for weakness and numbness.     Physical Exam Triage Vital Signs ED Triage Vitals  Enc Vitals Group     BP 11/06/22 1716 (!) 149/92     Pulse Rate 11/06/22 1716 85     Resp 11/06/22 1716 12     Temp 11/06/22 1716 98.3 F (36.8 C)     Temp Source 11/06/22 1716 Oral     SpO2 11/06/22 1716 96 %     Weight --      Height --      Head Circumference --      Peak Flow --      Pain Score 11/06/22 1710 8     Pain Loc --      Pain Edu? --      Excl. in [place]? --    No data found.  Updated Vital Signs BP (!) 149/92 (BP Location: Right Arm)   Pulse 85   Temp 98.3 F (36.8 C) (Oral)   Resp 12   SpO2 96%   Visual Acuity Right Eye Distance:   Left Eye Distance:   Bilateral Distance:    Right Eye Near:   Left Eye Near:    Bilateral Near:     Physical Exam Vitals and nursing note reviewed.  Constitutional:      General: She is not in acute distress.    Appearance: She is well-developed.  HENT:     Head: Normocephalic and atraumatic.  Eyes:     General: No visual field deficit.    Conjunctiva/sclera: Conjunctivae normal.  Cardiovascular:     Rate and Rhythm: Normal rate and regular rhythm.     Heart sounds: S1  normal and S2 normal. No murmur heard. Pulmonary:     Effort: Pulmonary effort is normal. No respiratory distress.     Breath sounds: Normal breath sounds.     Comments: Lungs vesicular posteriorly. Abdominal:  Palpations: Abdomen is soft.     Tenderness: There is no abdominal tenderness.  Musculoskeletal:        General: Tenderness present. No swelling.     Right shoulder: Tenderness present. No swelling or deformity. Decreased range of motion.     Cervical back: Neck supple.     Comments: Limited internal rotation and adduction, as well as limited external rotation and abduction.   Point tenderness to acromion area that extends down bicep.   Flexion limited to R shoulder as well   Skin:    General: Skin is warm and dry.     Capillary Refill: Capillary refill takes less than 2 seconds.  Neurological:     Mental Status: She is alert.     GCS: GCS eye subscore is 4. GCS verbal subscore is 5. GCS motor subscore is 6.     Cranial Nerves: No cranial nerve deficit, dysarthria or facial asymmetry.     Sensory: No sensory deficit.     Motor: No weakness.     Coordination: Coordination normal.  Psychiatric:        Mood and Affect: Mood normal.        Behavior: Behavior is cooperative.      UC Treatments / Results  Labs (all labs ordered are listed, but only abnormal results are displayed) Labs Reviewed  POCT URINALYSIS DIPSTICK, ED / UC - Abnormal; Notable for the following components:      Result Value   Glucose, UA >=1000 (*)    Ketones, ur TRACE (*)    Hgb urine dipstick SMALL (*)    All other components within normal limits  CBG MONITORING, ED - Abnormal; Notable for the following components:   Glucose-Capillary 132 (*)    All other components within normal limits    EKG   Radiology DG Shoulder Right  Result Date: 11/06/2022 CLINICAL DATA:  Shoulder pain EXAM: RIGHT SHOULDER - 2+ VIEW COMPARISON:  None Available. FINDINGS: AC joint is intact. No fracture or  malalignment, limited Y-view. Mild glenohumeral degenerative change IMPRESSION: No acute osseous abnormality. Electronically Signed   By: Donavan Foil M.D.   On: 11/06/2022 17:56    Procedures Procedures (including critical care time)  Medications Ordered in UC Medications - No data to display  Initial Impression / Assessment and Plan / UC Course  I have reviewed the triage vital signs and the nursing notes.  Pertinent labs & imaging results that were available during my care of the patient were reviewed by me and considered in my medical decision making (see chart for details).  Vitals and triage reviewed, patient is hemodynamically stable.  Headache is improving, patient denies photophobia, phonophobia, nausea, vomiting, emesis or changes in vision.  This is reassuring, patient does report an increase in stress and lack of sleep.  Declined Tylenol or ibuprofen in clinic, reports she would like to go home and sleep instead.  Right shoulder with limited ROM due to pain, has been ongoing for over a month. Failed conservative treatment. Right shoulder imaging negative for fracture or dislocation, suspect MS etiology and referred to orthopedics for further evaluation.  Follow-up, return precautions and emergency precautions discussed, patient verbalized understanding, no questions at this time.      Final Clinical Impressions(s) / UC Diagnoses   Final diagnoses:  Pain in joint of right shoulder  Acute intractable headache, unspecified headache type  Type 2 diabetes mellitus with hyperglycemia, without long-term current use of insulin [facility])     Discharge  Instructions      Your shoulder imaging was negative for fracture or dislocation.  You can continue to use heat or ice, please call Raliegh Ip tomorrow and schedule follow-up appointment for further evaluation.  Please go home and get some rest, if your headache persists, you can take Tylenol or ibuprofen.  Please seek immediate care  if you develop the worst headache of your life, changes in vision, nausea, emesis or worsening of symptoms.  Your urine today had glucose and trace ketones. Please follow-up with your primary care in regards to your diabetes treatment.       ED Prescriptions   None    PDMP not reviewed this encounter.   Zhania Shaheen, [place] N, [place] 11/06/22 [phone]

## 2022-11-06 NOTE — ED Triage Notes (Signed)
Pt is here for headache , shoulder pain nausea, chills x3days

## 2022-11-20 ENCOUNTER — Telehealth: Payer: Self-pay | Admitting: Student

## 2022-11-20 DIAGNOSIS — E1165 Type 2 diabetes mellitus with hyperglycemia: Secondary | ICD-10-CM

## 2022-11-20 NOTE — Telephone Encounter (Signed)
Called pt Frances Wilson to ask if she had a pharmacy she wants [place] to send the medication. I  explained we have no way of knowing who may have Trulicity in stock. I asked the pt to let us know the pharmacy she would like [place] to send the Rx to and we will be glad to do so. Ottis Stain, CMA

## 2022-11-20 NOTE — Telephone Encounter (Signed)
Patient came in stating that her pharmacy was out of Trulicity so she was unable to pick it up. She is asking if it can be sent to any other pharmacy that has it in stock, and to let her her know please.

## 2022-11-21 ENCOUNTER — Other Ambulatory Visit (HOSPITAL_COMMUNITY): Payer: Self-pay

## 2022-11-21 MED ORDER — TRULICITY 1.5 MG/0.5ML ~~LOC~~ SOAJ
1.5000 mg | SUBCUTANEOUS | 3 refills | Status: DC
Start: 2022-11-21 — End: 2023-04-01
  Filled 2022-11-21: qty 2, 28d supply, fill #0
  Filled 2022-12-23: qty 2, 28d supply, fill #1
  Filled 2023-02-04: qty 2, 28d supply, fill #2
  Filled 2023-03-14: qty 2, 28d supply, fill #3

## 2022-11-21 NOTE — Telephone Encounter (Signed)
[facility] and they have Trulicity in stock. Called patient and left voicemail about prescription. Sent in prescription to [facility] on [address], [place], PGY-1 11/21/22 1:17 PM

## 2022-11-22 ENCOUNTER — Other Ambulatory Visit (HOSPITAL_COMMUNITY): Payer: Self-pay

## 2022-12-22 ENCOUNTER — Other Ambulatory Visit: Payer: Self-pay | Admitting: Student

## 2022-12-22 DIAGNOSIS — E119 Type 2 diabetes mellitus without complications: Secondary | ICD-10-CM

## 2022-12-23 ENCOUNTER — Other Ambulatory Visit (HOSPITAL_COMMUNITY): Payer: Self-pay

## 2023-01-18 ENCOUNTER — Other Ambulatory Visit: Payer: Self-pay | Admitting: Student

## 2023-01-18 DIAGNOSIS — E119 Type 2 diabetes mellitus without complications: Secondary | ICD-10-CM

## 2023-01-19 ENCOUNTER — Other Ambulatory Visit: Payer: Self-pay | Admitting: Student

## 2023-01-19 DIAGNOSIS — F32A Depression, unspecified: Secondary | ICD-10-CM

## 2023-01-19 DIAGNOSIS — F4312 Post-traumatic stress disorder, chronic: Secondary | ICD-10-CM

## 2023-02-07 ENCOUNTER — Other Ambulatory Visit (HOSPITAL_COMMUNITY): Payer: Self-pay

## 2023-03-03 ENCOUNTER — Other Ambulatory Visit: Payer: Self-pay

## 2023-03-03 ENCOUNTER — Ambulatory Visit: Payer: BC Managed Care – PPO | Admitting: Student

## 2023-03-03 ENCOUNTER — Encounter: Payer: Self-pay | Admitting: Student

## 2023-03-03 VITALS — BP 145/92 | HR 98 | Ht 67.0 in | Wt 167.6 lb

## 2023-03-03 DIAGNOSIS — F4312 Post-traumatic stress disorder, chronic: Secondary | ICD-10-CM | POA: Diagnosis not present

## 2023-03-03 DIAGNOSIS — E1165 Type 2 diabetes mellitus with hyperglycemia: Secondary | ICD-10-CM | POA: Diagnosis not present

## 2023-03-03 DIAGNOSIS — F32A Depression, unspecified: Secondary | ICD-10-CM | POA: Diagnosis not present

## 2023-03-03 DIAGNOSIS — F419 Anxiety disorder, unspecified: Secondary | ICD-10-CM

## 2023-03-03 LAB — POCT GLYCOSYLATED HEMOGLOBIN (HGB A1C): HbA1c, POC (controlled diabetic range): 10.9 % — AB (ref 0.0–7.0)

## 2023-03-03 MED ORDER — VENLAFAXINE HCL ER 75 MG PO CP24: 75.0000 mg | ORAL_CAPSULE | Freq: Every day | ORAL | 2 refills | Status: DC

## 2023-03-03 NOTE — Assessment & Plan Note (Addendum)
Due to current circumstances we will increase her Effexor to 75 mg daily.  Will also reach out to [facility] to discuss for ability options not requiring co-pay.  Rescheduled patient to follow-up in clinic in 4 weeks.  Provided resources for therapy options.

## 2023-03-03 NOTE — Progress Notes (Signed)
    SUBJECTIVE:   CHIEF COMPLAINT / HPI:   Frances Wilson is a 57 y.o. female  presenting for follow-up of severe anxiety and depression.  She has had many life stressors currently including financial.  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  She is very distressed by her current situation and fearful of returning back into poverty.   She reports the Effexor 37.5 mg tabs have been helpful for her mood.  She has had thoughts of wanting to end things but says she would not act on them due to having good family support and not wanting to hurt her family.  She has her daughters that she calls whenever she feels low.   Type 2 diabetes: She has been compliant on her metformin.  However she has not been able to get her [place].  PERTINENT  PMH / PSH: Reviewed and updated   OBJECTIVE:   BP (!) 145/92   Pulse 98   Ht 5\' 7"  (1.702 m)   Wt 167 lb 9.6 oz (76 kg)   SpO2 100%   BMI 26.25 kg/m   Well-appearing, no acute distress, tearful on exam Cardio: Regular rate, regular rhythm, no murmurs on exam. Pulm: Clear, no wheezing, no crackles. No increased work of breathing Abdominal: bowel sounds present, soft, non-tender, non-distended Extremities: no peripheral edema  Neuro: alert and oriented x3, speech normal in content, no facial asymmetry, strength intact and equal bilaterally in UE and LE, pupils equal and reactive to light.  Psych:  Cognition and judgment appear intact. Alert, communicative  and cooperative with normal attention span and concentration. No apparent delusions, illusions, hallucinations      03/03/2023    3:37 PM 10/24/2022    3:53 PM 09/23/2022    4:06 PM  PHQ9 SCORE ONLY  PHQ-9 Total Score 20 14 16       ASSESSMENT/PLAN:   Anxiety and depression Due to current circumstances we will increase her Effexor to 75 mg daily.  Will also reach out to [facility] to discuss for ability options not requiring co-pay.   Rescheduled patient to follow-up in clinic in 4 weeks.  Provided resources for therapy options.  Type 2 diabetes mellitus with hyperglycemia (HCC) A1c 10.9 which is improved from prior.  With stressful circumstances will attempt to get her in a better mental and financial position before working on improving her diabetes.   At Next Visit: - recheck blood pressure   Glendale Chard, DO [facility]

## 2023-03-03 NOTE — Patient Instructions (Addendum)
It was great to see you today!   Today we addressed: Increased Effexor to 75 mg daily. You can take two tablets of previous Effexor until new prescription   Future Appointments  Date Time Provider Department Center  04/01/2023  4:00 PM Glendale Chard, DO [facility]    Please arrive 15 minutes before your appointment to ensure smooth check in process.    Please call the clinic at [phone] if your symptoms worsen or you have any concerns.  Thank you for allowing me to participate in your care, Dr. Glendale Chard [facility]    For information on therapists, please go to [email or web address]. You can also contact your insurance company to find an Hospital doctor.   Outpatient Mental Health Providers (No Insurance required or Self Pay)  [facility]  [phone] [email or web address] Marcy Panning and [facility]  [address] [place], [place] [phone] Crisis [phone]  [facility]) can see uninsured folks for outpatient therapy [email or web address] [address] [place], [place] [address] [phone]  [facility]    Walk-in Mon-Fri, 8am-3pm [email or web address] [address] [place], [place]  [phone]   [address]  [place] [phone] [facility] for psych med management, there may be a wait- if [facility] is working with clients for OPT, they will coordinate with [facility] for psych  [facility]   Walk-in-Clinic: Monday- Friday 9:00 AM - 4:00 PM [address]   [place], [place] [phone]  [facility] [place] ([employer]) walk in M-F 8am-12pm and  1pm-3pm [place]- [address]     [phone]  [employer] -[address]  Phone: [phone]  [employer] (Mental Health and substance challenges) [address]   [place]  [phone]    [email or web address]    [address]  [employer] [facility]  [facility] -[address], [place]     Phone:  [phone] [facility]-  [address]  [phone]    [facility] ( virtual or zoom therapy) [email or web address]  [address] [place]  [phone]    [facility] [phone]  grief counseling, dementia and caregiver support    [facility] Walk-in MWF 12:30 to 3:00     [address] [place] [address]  [phone]  [email or web address] call to schedule an appointment     National Alliance on Mental Illness (NAMI) [place]- Wellness classes, Support groups        [address] [place], [place] [address] [phone]   [email or web address]   [facility]  (Psycho-social Rehabilitation clubhouse, Individual and group therapy) [address] [place], [place] [address]   [phone]  24- Hour Availability:  [facility] [phone] or [phone] * [facility] [employer] (Domestic Violence, Rape, etc. )5793677950 Vesta Mixer [phone] or [phone] * [facility] 7327232025 only) [phone] (after hours) *[facility] Mobile Crisis Unit [phone] *[place] National Suicide Hotline [phone] Len Childs)

## 2023-03-03 NOTE — Assessment & Plan Note (Addendum)
A1c 10.9 which is improved from prior.  With stressful circumstances will attempt to get her in a better mental and financial position before working on improving her diabetes.

## 2023-03-05 ENCOUNTER — Other Ambulatory Visit (HOSPITAL_COMMUNITY): Payer: Self-pay

## 2023-03-06 ENCOUNTER — Other Ambulatory Visit (HOSPITAL_COMMUNITY): Payer: Self-pay

## 2023-03-06 ENCOUNTER — Telehealth: Payer: Self-pay | Admitting: Student

## 2023-03-06 MED ORDER — VENLAFAXINE HCL ER 75 MG PO CP24
75.0000 mg | ORAL_CAPSULE | Freq: Every day | ORAL | 3 refills | Status: DC
  Filled 2023-03-06: qty 30, 30d supply, fill #0

## 2023-03-06 NOTE — Telephone Encounter (Signed)
Attempted to call patient about medication affordability, left HIPAA compliant voicemail.   Patient has been struggling financially and needs help with medication affordability. Contacted [facility] and a 30 day supply of 75 mg Effexor will be $5 at [facility] [employer].   Will send in prescription to [facility] on [address].   Glendale Chard, DO [facility], PGY-2 03/06/23 12:52 PM

## 2023-03-14 ENCOUNTER — Other Ambulatory Visit (HOSPITAL_COMMUNITY): Payer: Self-pay

## 2023-03-20 ENCOUNTER — Other Ambulatory Visit: Payer: Self-pay | Admitting: Student

## 2023-03-20 DIAGNOSIS — E119 Type 2 diabetes mellitus without complications: Secondary | ICD-10-CM

## 2023-04-01 ENCOUNTER — Ambulatory Visit: Payer: BC Managed Care – PPO | Admitting: Student

## 2023-04-01 ENCOUNTER — Other Ambulatory Visit (HOSPITAL_COMMUNITY): Payer: Self-pay

## 2023-04-01 VITALS — BP 149/86 | HR 85 | Ht 67.0 in | Wt 167.2 lb

## 2023-04-01 DIAGNOSIS — E1165 Type 2 diabetes mellitus with hyperglycemia: Secondary | ICD-10-CM | POA: Diagnosis not present

## 2023-04-01 DIAGNOSIS — E119 Type 2 diabetes mellitus without complications: Secondary | ICD-10-CM | POA: Diagnosis not present

## 2023-04-01 DIAGNOSIS — F32A Depression, unspecified: Secondary | ICD-10-CM

## 2023-04-01 DIAGNOSIS — F4312 Post-traumatic stress disorder, chronic: Secondary | ICD-10-CM | POA: Diagnosis not present

## 2023-04-01 DIAGNOSIS — F419 Anxiety disorder, unspecified: Secondary | ICD-10-CM

## 2023-04-01 DIAGNOSIS — I10 Essential (primary) hypertension: Secondary | ICD-10-CM | POA: Diagnosis not present

## 2023-04-01 MED ORDER — METFORMIN HCL ER 500 MG PO TB24
1000.0000 mg | ORAL_TABLET | Freq: Two times a day (BID) | ORAL | 0 refills | Status: DC
  Filled 2023-04-01: qty 120, 30d supply, fill #0

## 2023-04-01 MED ORDER — TRULICITY 1.5 MG/0.5ML ~~LOC~~ SOAJ
1.5000 mg | SUBCUTANEOUS | 3 refills | Status: DC
Start: 2023-04-01 — End: 2023-04-09
  Filled 2023-04-01: qty 4, 56d supply, fill #0

## 2023-04-01 MED ORDER — DAPAGLIFLOZIN PROPANEDIOL 10 MG PO TABS
10.0000 mg | ORAL_TABLET | Freq: Every day | ORAL | 0 refills | Status: DC
Start: 2023-04-01 — End: 2023-06-03
  Filled 2023-04-01: qty 30, 30d supply, fill #0

## 2023-04-01 MED ORDER — ROSUVASTATIN CALCIUM 20 MG PO TABS
20.0000 mg | ORAL_TABLET | Freq: Every day | ORAL | 3 refills | Status: DC
Start: 2023-04-01 — End: 2024-04-23
  Filled 2023-04-01 – 2023-08-11 (×2): qty 90, 90d supply, fill #0
  Filled 2023-11-16: qty 90, 90d supply, fill #1
  Filled 2024-01-23: qty 90, 90d supply, fill #2

## 2023-04-01 MED ORDER — VENLAFAXINE HCL ER 75 MG PO CP24
75.0000 mg | ORAL_CAPSULE | Freq: Every day | ORAL | 3 refills | Status: DC
  Filled 2023-04-01: qty 30, 30d supply, fill #0

## 2023-04-01 MED ORDER — TRAZODONE HCL 50 MG PO TABS
50.0000 mg | ORAL_TABLET | Freq: Every evening | ORAL | 1 refills | Status: DC | PRN
Start: 2023-04-01 — End: 2024-07-13
  Filled 2023-04-01: qty 30, 30d supply, fill #0

## 2023-04-01 NOTE — Assessment & Plan Note (Signed)
BP elevated in the office, due to her stress will monitor for now and once she gets into a better mental space will recheck and consider medication therapy like an ARB.

## 2023-04-01 NOTE — Patient Instructions (Signed)
It was great to see you today!   Call to schedule an appointment in early October to follow up on your mental health and your blood pressure. We will also check your A1c and labs at that visit.   I have sent all of your medications to [facility]  Please arrive 15 minutes before your appointment to ensure smooth check in process.    Please call the clinic at [phone] if your symptoms worsen or you have any concerns.  Thank you for allowing me to participate in your care, Dr. Glendale Chard [facility]
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
## 2023-04-01 NOTE — Assessment & Plan Note (Signed)
Stable, no SI. Refilled Effexor and trazodone. Will plan for follow up in early October as her schedule allows. She is set up to see a therapist that is covered by her insurance. Encouraged her to keep that appointment

## 2023-04-09 ENCOUNTER — Other Ambulatory Visit (HOSPITAL_COMMUNITY): Payer: Self-pay

## 2023-04-09 ENCOUNTER — Encounter (INDEPENDENT_AMBULATORY_CARE_PROVIDER_SITE_OTHER): Payer: BC Managed Care – PPO | Admitting: Pharmacist

## 2023-04-09 ENCOUNTER — Encounter: Payer: Self-pay | Admitting: Pharmacist

## 2023-04-09 VITALS — BP 125/84 | HR 79 | Ht 67.0 in | Wt 167.4 lb

## 2023-04-09 DIAGNOSIS — E1165 Type 2 diabetes mellitus with hyperglycemia: Secondary | ICD-10-CM

## 2023-04-09 LAB — POCT GLYCOSYLATED HEMOGLOBIN (HGB A1C): HbA1c, POC (controlled diabetic range): 10.4 % — AB (ref 0.0–7.0)

## 2023-04-09 MED ORDER — TRULICITY 3 MG/0.5ML ~~LOC~~ SOAJ
3.0000 mg | SUBCUTANEOUS | 3 refills | Status: DC
Start: 2023-04-09 — End: 2023-04-25
  Filled 2023-04-09: qty 2, 28d supply, fill #0
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
## 2023-04-09 NOTE — Patient Instructions (Signed)
It was a pleasure seeing you today!  The sensor is small waterproof disc that is placed on the back of the upper arm.  There is a very thin filament that is inserted under the surface of the skin and measures the amount of glucose in the interstitial fluid.  This system collects your sugar levels for up to 14 days and it automatically records the glucose level every 15 minutes. This will show your provider any patterns in your glucose levels.  Please remember... 1. Sensor will last 14 days 2. Sensor should be applied to area away from scarring, tattoos, irritation, and bones. 3. Starting the sensor: 1 hour warm up before BG readings available   4. Scan the sensor at least every 8 hours for the FreeStyle Libre 2. Libre 3 does not require scanning.  5. Hold reader within 1.5 inches of sensor to scan 6. When the blood drop and magnifying glass symbol appears, test fingerstick blood glucose prior to making treatment decisions 7. Do a fingerstick blood glucose test if the sensor readings do not match how you feel 8. Remove sensor prior to magnetic resonance imaging (MRI), computed tomography (CT) scan, or high-frequency electrical heat (diathermy) treatment. 9. Freestyle Libre may be worn through a Industrial/product designer. It may not be exposed to an advanced Imaging Technology (AIT) body scanner (also called a millimeter wave scanner) or the baggage x-ray machine. Instead, ask for hand-wanding or full-body pat-down and visual inspection.  10. Doses of vitamin C (ascorbic acid) >500 mg every day may cause false high readings. 11. Do not submerge more than 3 feet or keep underwater longer than 30 minutes at a time. Gently pat to dry.  12. Store sensor kit between 39 and 77 degrees Farenheit. Can be refrigerated within this temperature range.  Problems with Freestyle Libre sticking? 1. Order Tegaderm I.V. films to place directly over [facility] sensor on arm. 2. May also order Skin Tac from  [facility]. Alcohol swab area you plan to administer Freestyle Libre then let dry. Once dry, apply Skin Tac in a circular motion (with a spot in the middle for sensor without skin tac) and let dry. Once dry you can apply Freestyle Libre   Problems taking off Freestyle [place]? 1. Remember to try to shower before removing Freestyle Libre 2. Order Tac Away to help remove any extra adhesive left on your skin once you remove Freestyle Libre 3. May also try baby oil to loosen adhesive  Freestyle [facility] Phone number: [phone] Available 7 days a week; excluding holidays 8 AM to 8PM EST  [email or web address]  Please do the following:  Increase Trulicity (dulaglutide) to 3mg  once weekly as directed today during your appointment. If you have any questions or if you believe something has occurred because of this change, call me or your doctor to let one of [place] know.  Continue checking blood sugars at home. It's really important that you record these and bring these in to your next doctor's appointment.  Continue making the lifestyle changes we've discussed together during our visit. Diet and exercise play a significant role in improving your blood sugars.  Follow-up with me in 2 weeks.   Hypoglycemia or low blood sugar:   Low blood sugar can happen quickly and may become an emergency if not treated right away.   While this shouldn't happen often, it can be brought upon if you skip a meal or do not eat enough. Also, if your insulin or other diabetes  medications are dosed too high, this can cause your blood sugar to go to low.   Warning signs of low blood sugar include: Feeling shaky or dizzy Feeling weak or tired  Excessive hunger Feeling anxious or upset  Sweating even when you aren't exercising  What to do if I experience low blood sugar? Follow the Rule of 15 Check your blood sugar. If lower than 70, proceed to step 2.  Treat with 15 grams of fast acting carbs which is found in 3-4  glucose tablets. If none are available you can try hard candy, 1 tablespoon of sugar or honey,4 ounces of fruit juice, or 6 ounces of REGULAR soda.  Re-check your sugar in 15 minutes. If it is still below 70, do what you did in step 2 again. If your blood sugar has come back up, go ahead and eat a snack or small meal made up of complex carbs (ex. Whole grains) and protein at this time to avoid recurrence of low blood sugar.

## 2023-04-09 NOTE — Assessment & Plan Note (Signed)
LIBERATE Study:  -Patient provided verbal consent to participate in the study. Consent documented in electronic medical record.  -Provided education on Libre 3 CGM. Collaborated to ensure Josephine Igo 3 app was downloaded on patient's phone. Educated on how to place sensor every 14 days, patient placed first sensor correctly and verbalized understanding of use, removal, and how to place next sensor. Discussed alarms. 8 sensors provided for a 3 month supply. Educated to contact the office if the sensor falls off early and replacements are needed before their next [employer].   Diabetes longstanding currently uncontrolled. Patient is able to verbalize appropriate hypoglycemia management plan. Medication adherence appears optimal. Control is suboptimal due to suboptimal medication regimen. -Increased dose of GLP-1 Trulicity (generic dulaglutide) to 3mg  once weekly.  -Continued SGLT2-I Farxiga (generic dapagliflozin) 10mg  daily. Counseled on sick day rules. -Continued metformin 1,000mg  twice daily.  -Patient educated on purpose, proper use, and potential adverse effects.  -Extensively discussed pathophysiology of diabetes, recommended lifestyle interventions, dietary effects on blood sugar control.  -Counseled on s/sx of and management of hypoglycemia.  -Next A1c anticipated November 2024.

## 2023-04-10 NOTE — Progress Notes (Signed)
Reviewed and agree with Dr Koval's plan.   

## 2023-04-25 ENCOUNTER — Encounter: Payer: Self-pay | Admitting: Pharmacist

## 2023-04-25 ENCOUNTER — Ambulatory Visit: Payer: BC Managed Care – PPO | Admitting: Pharmacist

## 2023-04-25 ENCOUNTER — Other Ambulatory Visit (HOSPITAL_COMMUNITY): Payer: Self-pay

## 2023-04-25 VITALS — BP 133/82 | HR 79 | Ht 67.0 in | Wt 166.8 lb

## 2023-04-25 DIAGNOSIS — E1165 Type 2 diabetes mellitus with hyperglycemia: Secondary | ICD-10-CM | POA: Diagnosis not present

## 2023-04-25 MED ORDER — VENLAFAXINE HCL ER 75 MG PO CP24
75.0000 mg | ORAL_CAPSULE | Freq: Every day | ORAL | 3 refills | Status: DC
  Filled 2023-04-25: qty 30, 30d supply, fill #0

## 2023-04-25 MED ORDER — TRULICITY 4.5 MG/0.5ML ~~LOC~~ SOAJ
4.5000 mg | SUBCUTANEOUS | 3 refills | Status: DC
Start: 2023-04-25 — End: 2023-06-03
  Filled 2023-04-25: qty 2, 28d supply, fill #0

## 2023-04-25 NOTE — Assessment & Plan Note (Signed)
Diabetes longstanding currently uncontrolled with last A1c (03/2023) of 10.4%, improved with today's GMI of 7.7%. Patient is able to verbalize appropriate hypoglycemia management plan. Medication adherence appears good. Control is suboptimal due to patient taking decreased dose of metformin XR as she would like to stop this medication and believes the Trulcity (dulaglutide) is working much better.  - Continued metformin XR 500 mg 2 tablets once a day at this time.  - Increased Trulicity (dulaglutide) from 3 to 4.5 mg once weekly. She will increase this dose in ` 2-3 weeks following use of current 3mg  supply.  - Continued Farxiga (dapagliflozin) 10 mg once daily. -Patient educated on purpose, proper use, and potential adverse effects of increased dose of Trulicity. Advised patient to follow up on tolerance of increased dose with Dr. Hyacinth Meeker. -Extensively discussed pathophysiology of diabetes, recommended lifestyle interventions, dietary effects on blood sugar control.  -Counseled on s/sx of and management of hypoglycemia.  -Next A1c anticipated at 3 month follow up visit for Liberate Study.

## 2023-04-25 NOTE — Progress Notes (Signed)
Reviewed and agree with Dr Koval's plan.   

## 2023-04-25 NOTE — Patient Instructions (Signed)
It was nice to see you today!  Your goal blood sugar is 80-130 before eating and less than 180 after eating.  Medication Changes:  Increase Trulicity to 4.5 mg once weekly.  We have sent a new script for the Effexor XR (venlafaxine) to your pharmacy.  Continue all other medication the same.   Keep up the good work with diet and exercise. Aim for a diet full of vegetables, fruit and lean meats (chicken, [place], fish). Try to limit salt intake by eating fresh or frozen vegetables (instead of canned), rinse canned vegetables prior to cooking and do not add any additional salt to meals.

## 2023-04-25 NOTE — Progress Notes (Signed)
S:     Chief Complaint  Patient presents with   Medication Management    Diabetes f/u Post Liberate Enrollment   57 y.o. female who presents for diabetes evaluation, education, and management. Patient arrives in good spirits and presents without any assistance.   Patient was referred and last seen by Primary Care Provider, Dr. Hyacinth Meeker, on 04/01/23.   PMH is significant for diabetes and depression.  At last visit, patient enrolled in the [place]. Increased dose of GLP-1 Trulicity (dulaglutide) to 3 mg once weekly.   Per EHR, patient has been diagnosed with diabetes since at least 2015.   Current diabetes medications include: Farxiga (dapagliflozin) 10 mg once daily, Trulicity (dulaglutide) 3 mg once weekly, metformin XR 500 mg 2 tablets twice daily (patient reports taking differently, takes 2 tablets once a day) Current hyperlipidemia medications include: Crestor (rosuvastatin) 20 mg once weekly  Patient reports adherence to taking all medications as prescribed.   Do you feel that your medications are working for you? yes  Insurance coverage: BCBS  Patient denies hypoglycemic events.  Patient denies nocturia (nighttime urination).  Patient denies neuropathy (nerve pain). Patient denies visual changes. Patient reports self foot exams.   O:   Review of Systems  All other systems reviewed and are negative.   Physical Exam Constitutional:      Appearance: Normal appearance.  Pulmonary:     Effort: Pulmonary effort is normal.  Neurological:     Mental Status: She is alert.  Psychiatric:        Mood and Affect: Mood normal.        Behavior: Behavior normal.        Thought Content: Thought content normal.        Judgment: Judgment normal.     7 day average blood glucose: 184 mg/dL  Libre3 CGM Download today on 04/25/23 % Time CGM is active: 96 % Average Glucose: 184 mg/dL Glucose Management Indicator: 7.7  Glucose Variability: 21.7% (goal <36%) Time in  Goal:  - Time in range 70-180: 56% - Time above range: 44% - Time below range: 0%   Lab Results  Component Value Date   HGBA1C 10.4 (A) 04/09/2023   Vitals:   04/25/23 0854  BP: 133/82  Pulse: 79  SpO2: 100%    Lipid Panel     Component Value Date/Time   CHOL 162 12/27/2021 1525   TRIG 112 12/27/2021 1525   HDL 47 12/27/2021 1525   CHOLHDL 3.4 12/27/2021 1525   CHOLHDL 6.8 (H) 10/09/2016 1529   VLDL 38 (H) 10/09/2016 1529   LDLCALC 95 12/27/2021 1525    Clinical Atherosclerotic Cardiovascular Disease (ASCVD): No  The 10-year ASCVD risk score (Arnett DK, et al., 2019) is: 10.4%   Values used to calculate the score:     Age: 8 years     Sex: Female     Is Non-Hispanic African American: Yes     Diabetic: Yes     Tobacco smoker: No     Systolic Blood Pressure: 133 mmHg     Is BP treated: No     HDL Cholesterol: 47 mg/dL     Total Cholesterol: 162 mg/dL   A/P: Diabetes longstanding currently uncontrolled with last A1c (03/2023) of 10.4%, improved with today's GMI of 7.7%. Patient is able to verbalize appropriate hypoglycemia management plan. Medication adherence appears good. Control is suboptimal due to patient taking decreased dose of metformin XR as she would like to stop this medication  and believes the Trulcity (dulaglutide) is working much better.  - Continued metformin XR 500 mg 2 tablets once a day at this time.  - Increased Trulicity (dulaglutide) from 3 to 4.5 mg once weekly. She will increase this dose in ` 2-3 weeks following use of current 3mg  supply.  - Continued Farxiga (dapagliflozin) 10 mg once daily. -Patient educated on purpose, proper use, and potential adverse effects of increased dose of Trulicity. Advised patient to follow up on tolerance of increased dose with Dr. Hyacinth Meeker. -Extensively discussed pathophysiology of diabetes, recommended lifestyle interventions, dietary effects on blood sugar control.  -Counseled on s/sx of and management of  hypoglycemia.  -Next A1c anticipated at 3 month follow up visit for Liberate Study.   ASCVD risk - primary prevention in patient with diabetes. Last LDL is 95 not at goal of <70 mg/dL. - Continued Crestor (rosuvastatin) 20 mg once daily  Patient reports running out of Effexor XR (venlafaxine) in July 2024.  Patient desires to stay on this medication.  - Refilled Effexor XR (venlafaxine) 75 mg once daily.  Written patient instructions provided. Patient verbalized understanding of treatment plan.  Total time in face to face counseling 34 minutes.      Follow-up:  Pharmacist visit with Dr. Raymondo Band on 06/04/23 PCP clinic visit with Dr. Hyacinth Meeker on 06/04/23 Patient seen with Alesia Banda, PharmD Candidate and Andee Poles, PharmD Candidate.

## 2023-05-05 ENCOUNTER — Other Ambulatory Visit (HOSPITAL_COMMUNITY): Payer: Self-pay

## 2023-05-28 ENCOUNTER — Other Ambulatory Visit: Payer: Self-pay | Admitting: Student

## 2023-05-28 DIAGNOSIS — E119 Type 2 diabetes mellitus without complications: Secondary | ICD-10-CM

## 2023-06-03 ENCOUNTER — Ambulatory Visit: Payer: BC Managed Care – PPO | Admitting: Pharmacist

## 2023-06-03 ENCOUNTER — Other Ambulatory Visit (HOSPITAL_COMMUNITY): Payer: Self-pay

## 2023-06-03 ENCOUNTER — Encounter: Payer: Self-pay | Admitting: Pharmacist

## 2023-06-03 VITALS — BP 136/73 | HR 74

## 2023-06-03 DIAGNOSIS — E1165 Type 2 diabetes mellitus with hyperglycemia: Secondary | ICD-10-CM | POA: Diagnosis not present

## 2023-06-03 DIAGNOSIS — Z794 Long term (current) use of insulin: Secondary | ICD-10-CM | POA: Diagnosis not present

## 2023-06-03 DIAGNOSIS — E119 Type 2 diabetes mellitus without complications: Secondary | ICD-10-CM

## 2023-06-03 LAB — POCT GLYCOSYLATED HEMOGLOBIN (HGB A1C): HbA1c, POC (controlled diabetic range): 9.1 % — AB (ref 0.0–7.0)

## 2023-06-03 MED ORDER — TRULICITY 4.5 MG/0.5ML ~~LOC~~ SOAJ
4.5000 mg | SUBCUTANEOUS | 3 refills | Status: DC
  Filled 2023-06-03: qty 2, 28d supply, fill #0

## 2023-06-03 MED ORDER — DAPAGLIFLOZIN PROPANEDIOL 10 MG PO TABS
10.0000 mg | ORAL_TABLET | Freq: Every day | ORAL | 0 refills | Status: DC
  Filled 2023-06-03: qty 30, 30d supply, fill #0

## 2023-06-03 MED ORDER — METFORMIN HCL ER 500 MG PO TB24
1000.0000 mg | ORAL_TABLET | Freq: Two times a day (BID) | ORAL | 0 refills | Status: DC
  Filled 2023-06-03: qty 120, 30d supply, fill #0

## 2023-06-03 MED ORDER — LANTUS SOLOSTAR 100 UNIT/ML ~~LOC~~ SOPN
8.0000 [IU] | PEN_INJECTOR | Freq: Every day | SUBCUTANEOUS | 3 refills | Status: DC
Start: 2023-06-03 — End: 2023-07-09
  Filled 2023-06-03: qty 9, 84d supply, fill #0

## 2023-06-03 MED ORDER — INSULIN PEN NEEDLE 31G X 5 MM MISC
1.0000 | 11 refills | Status: AC | PRN
Start: 2023-06-03 — End: ?
  Filled 2023-06-03 – 2023-06-07 (×2): qty 100, 90d supply, fill #0
  Filled 2023-09-22: qty 100, 90d supply, fill #1

## 2023-06-03 NOTE — Progress Notes (Signed)
Reviewed and agree with Dr Koval's plan.   

## 2023-06-03 NOTE — Patient Instructions (Addendum)
It was nice to see you today!  Your goal blood pressure is <130/80 mmHg.  Medication Changes: START insulin glargine (Lantus) 8 units once daily   Continue all other medication the same.   Monitor blood pressure at home daily and keep a log (on your phone or piece of paper) to bring with you to your next visit. Write down date, time, blood pressure and pulse.  Keep up the good work with diet and exercise. Aim for a diet full of vegetables, fruit and lean meats (chicken, [place], fish). Try to limit salt intake by eating fresh or frozen vegetables (instead of canned), rinse canned vegetables prior to cooking and do not add any additional salt to meals.

## 2023-06-03 NOTE — Progress Notes (Signed)
S:     Chief Complaint  Patient presents with   Medication Management    T2DM F/U   57 y.o. female who presents for diabetes evaluation, education, and management. Patient arrives in good spirits and presents without any assistance.  Patient was referred and last seen by Primary Care Provider, Dr. Hyacinth Meeker, on 04/01/2023.       PMH is significant for T2DM, HLD.  At last visit, no changes to Pt's diabetes management, due to life stressors.   Patient reports Diabetes was diagnosed in early 17s, ~15 years.   Family/Social History: Lots of relatives had DM, unsure about biological parents DM status  Current diabetes medications include: dapagliflozin (Farxiga) 10 mg once daily, dulaglutide (Trulicity) 4.5 mg once weekly, metformin 500 mg 2 tablets twice daily Current hypertension medications include: None Current hyperlipidemia medications include: rosuvastatin (Crestor) 20 mg daily  Patient reports adherence to taking all medications as prescribed. Missed ~2 weeks of metformin due to running out, but has since picked up medication and is adherent.   Do you feel that your medications are working for you? yes Have you been experiencing any side effects to the medications prescribed? no Do you have any problems obtaining medications due to transportation or finances? no Insurance coverage: BCBS  Patient denies hypoglycemic events.  Patient denies nocturia (nighttime urination).  Patient denies neuropathy (nerve pain). Patient denies visual changes. Patient reports self foot exams.   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   Physical Exam Constitutional:      Appearance: Normal appearance.  Pulmonary:     Effort: Pulmonary  effort is normal.  Neurological:     Mental Status: She is alert.  Psychiatric:        Mood and Affect: Mood normal.        Behavior: Behavior normal.        Thought Content: Thought content normal.        Judgment: Judgment normal.      Lab Results  Component Value Date   HGBA1C 9.1 (A) 06/03/2023   Vitals:   06/03/23 0900 06/03/23 0901  BP: (!) 144/73 136/73  Pulse: 74   SpO2: 99%     Lipid Panel     Component Value Date/Time   CHOL 162 12/27/2021 1525   TRIG 112 12/27/2021 1525   HDL 47 12/27/2021 1525   CHOLHDL 3.4 12/27/2021 1525   CHOLHDL 6.8 (H) 10/09/2016 1529   VLDL 38 (H) 10/09/2016 1529   LDLCALC 95 12/27/2021 1525    Clinical Atherosclerotic Cardiovascular Disease (ASCVD): No  The 10-year ASCVD risk score (Arnett DK, et al., 2019) is: 11.9%   Values used to calculate the score:     Age: 72 years     Sex: Female     Is Non-Hispanic African American: Yes     Diabetic: Yes     Tobacco smoker: No     Systolic Blood Pressure: 136 mmHg     Is BP treated: No     HDL Cholesterol: 47 mg/dL     Total Cholesterol: 162 mg/dL         A/P: Diabetes longstanding currently uncontrolled however, A1C if improved from previous.  Patient is able to verbalize appropriate hypoglycemia management plan.  Medication adherence appears good. Control is suboptimal due to suboptimal medication regimen. Pt hesitant to start insulin, but agreed after further discussion.  -Started basal insulin glargine (Lantus) 8 units once daily -Patient educated on purpose, proper use, and potential adverse effects of hypoglycemia with new insulin start. Educated on proper technique of injecting insulin  -Extensively discussed pathophysiology of diabetes, recommended lifestyle interventions, dietary effects on blood sugar control.  -Counseled on s/sx of and management of hypoglycemia.  -Next A1c anticipated 07/09/2023 at 3 month LIBERATE visit.   ASCVD risk - primary prevention in patient with  diabetes. Last LDL is 95 (12/27/2021) not at goal of <70 mg/dL.  -Continued rosuvastatin 20 mg.   Sent refills  dapagliflozin (Farxiga) 10 mg, dulaglutide (Trulicity) 4.5 mg/0.5 mL, metformin XR 500 mg per Pt request to pharmacy of choice  Written patient instructions provided. Patient verbalized understanding of treatment plan.  Total time in face to face counseling 43 minutes.    Follow-up:  Pharmacist 07/09/2023 - 3 month LIBERATE visit PCP clinic visit in [place] Patient seen with Shona Simpson, PharmD Candidate.

## 2023-06-03 NOTE — Assessment & Plan Note (Signed)
Diabetes longstanding currently uncontrolled however, A1C if improved from previous.  Patient is able to verbalize appropriate hypoglycemia management plan. Medication adherence appears good. Control is suboptimal due to suboptimal medication regimen. Pt hesitant to start insulin, but agreed after further discussion.  -Started basal insulin glargine (Lantus) 8 units once daily -Patient educated on purpose, proper use, and potential adverse effects of hypoglycemia with new insulin start. Educated on proper technique of injecting insulin  -Extensively discussed pathophysiology of diabetes, recommended lifestyle interventions, dietary effects on blood sugar control.  -Counseled on s/sx of and management of hypoglycemia.  -Next A1c anticipated 07/09/2023 at 3 month LIBERATE visit.

## 2023-06-04 ENCOUNTER — Ambulatory Visit: Payer: BC Managed Care – PPO | Admitting: Pharmacist

## 2023-06-04 ENCOUNTER — Ambulatory Visit: Payer: BC Managed Care – PPO | Admitting: Student

## 2023-06-07 ENCOUNTER — Other Ambulatory Visit (HOSPITAL_COMMUNITY): Payer: Self-pay

## 2023-06-09 ENCOUNTER — Other Ambulatory Visit (HOSPITAL_COMMUNITY): Payer: Self-pay

## 2023-07-09 ENCOUNTER — Encounter: Payer: Self-pay | Admitting: Pharmacist

## 2023-07-09 ENCOUNTER — Other Ambulatory Visit (HOSPITAL_COMMUNITY): Payer: Self-pay

## 2023-07-09 ENCOUNTER — Encounter (INDEPENDENT_AMBULATORY_CARE_PROVIDER_SITE_OTHER): Payer: BC Managed Care – PPO | Admitting: Pharmacist

## 2023-07-09 VITALS — BP 150/78 | HR 89 | Wt 166.0 lb

## 2023-07-09 DIAGNOSIS — I10 Essential (primary) hypertension: Secondary | ICD-10-CM | POA: Diagnosis not present

## 2023-07-09 DIAGNOSIS — E1165 Type 2 diabetes mellitus with hyperglycemia: Secondary | ICD-10-CM

## 2023-07-09 DIAGNOSIS — E119 Type 2 diabetes mellitus without complications: Secondary | ICD-10-CM | POA: Diagnosis not present

## 2023-07-09 LAB — POCT GLYCOSYLATED HEMOGLOBIN (HGB A1C): HbA1c, POC (controlled diabetic range): 8.2 % — AB (ref 0.0–7.0)

## 2023-07-09 MED ORDER — VENLAFAXINE HCL ER 75 MG PO CP24
75.0000 mg | ORAL_CAPSULE | Freq: Every day | ORAL | 3 refills | Status: DC
  Filled 2023-07-09: qty 30, 30d supply, fill #0

## 2023-07-09 MED ORDER — LANTUS SOLOSTAR 100 UNIT/ML ~~LOC~~ SOPN: 10.0000 [IU] | PEN_INJECTOR | Freq: Every day | SUBCUTANEOUS | Status: DC

## 2023-07-09 MED ORDER — METFORMIN HCL ER 500 MG PO TB24
1000.0000 mg | ORAL_TABLET | Freq: Two times a day (BID) | ORAL | 0 refills | Status: DC
  Filled 2023-07-09: qty 120, 30d supply, fill #0

## 2023-07-09 MED ORDER — TRULICITY 4.5 MG/0.5ML ~~LOC~~ SOAJ
4.5000 mg | SUBCUTANEOUS | 3 refills | Status: DC
  Filled 2023-07-09: qty 2, 28d supply, fill #0
  Filled 2023-08-11: qty 2, 28d supply, fill #1
  Filled 2023-09-08: qty 2, 28d supply, fill #2
  Filled 2023-10-09: qty 2, 28d supply, fill #3

## 2023-07-09 NOTE — Patient Instructions (Addendum)
It was nice to see you today!  Your goal blood sugar is 80-130 before eating and less than 180 after eating.  START CHECKING Blood Pressure at home and recording a log of readings  Medication Changes: Increase insulin Lantus (glargine) from 8 units once daily to 10 units once daily  RESTART metformin XR 500 mg 2 tablets twice daily, Trulicity 4.5 mg once weekly, venlafaxine 75 mg once capsule once daily  Continue all other medication the same.   Keep up the good work with diet and exercise. Aim for a diet full of vegetables, fruit and lean meats (chicken, [place], fish). Try to limit salt intake by eating fresh or frozen vegetables (instead of canned), rinse canned vegetables prior to cooking and do not add any additional salt to meals.

## 2023-07-09 NOTE — Assessment & Plan Note (Signed)
Elevated BP in office today of two readings 156/86 and 150/78. Blood pressure goal of <130/80 mmHg. Patient is not taking any BP medications at this time.  UACR was evaluated last year.  -Encouraged starting log of BP measurements at home to bring to next visit

## 2023-07-09 NOTE — Research (Signed)
S:     Chief Complaint  Patient presents with   Medication Management    LIBERATE - 3 Month   57 y.o. female who presents for diabetes evaluation, education, and management in the context of the LIBERATE Study.  PMH is significant for T2DM, HLD.   Patient was last seen by Primary Care Provider, Dr. Samara Deist, on 04/01/2023.  At last pharmacy visit on 06/03/2023, started basal insulin glargine (Lantus) 8 units once daily.   Today, patient arrives in good spirits and presents without any assistance. Reports she has not taken her insulin yet today, since she had to run out of the house this morning.   Patient reports Diabetes was diagnosed in  early 77s, ~15 years. .   Family/Social History: Lots of relatives had DM, unsure about biological parents DM status   Current diabetes medications include: dapagliflozin (Farxiga) 10 mg once daily, dulaglutide (Trulicity) 4.5 mg once weekly, metformin 500 mg 2 tablets twice daily, insulin Lantus (glargine) 8 units once daily Current hypertension medications include: None Current hyperlipidemia medications include: rosuvastatin (Crestor) 20 mg daily  Patient reports adherence to taking all medications as prescribed. Been out of metformin since last week, missed last dose of Trulicity since she ran out.   Do you feel that your medications are working for you? Yes  Have you been experiencing any side effects to the medications prescribed? No  Insurance coverage: BCBS  Patient reports hypoglycemic events.~80s was the lowest she has ever seen  Patient reports nocturia (nighttime urination). ~2x nightly Patient denies neuropathy (nerve pain). Patient denies visual changes. Patient reports self foot exams.   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 Not able to do strength training since she has a torn rotator cuff.  Reported home BPs: ~130-140s/70-80s  O:   Review of Systems  All other systems reviewed and are negative.   Physical Exam Vitals reviewed.  Constitutional:      Appearance: Normal appearance.  Pulmonary:     Effort: Pulmonary effort is normal.  Neurological:     Mental Status: She is alert.  Psychiatric:        Mood and Affect: Mood normal.        Behavior: Behavior normal.        Thought Content: Thought content normal.        Judgment: Judgment normal.    7 day average blood glucose: 160  Libre3 CGM Download today on 07/09/2023 % Time CGM is active: 85% Average Glucose: 160 mg/dL Glucose Management Indicator: 7.1  Glucose Variability: 26.2% (goal <36%) Time in Goal:  - Time in range 70-180: 72% - Time above range: 28% - Time below range: 0% Observed patterns:  Lab Results  Component Value Date   HGBA1C 8.2 (A) 07/09/2023    Vitals:   07/09/23 0902 07/09/23 0905  BP: (!) 156/86 (!) 150/78  Pulse: 89   SpO2: 100%      Lipid Panel     Component Value Date/Time   CHOL 162 12/27/2021 1525   TRIG 112 12/27/2021 1525   HDL 47 12/27/2021 1525   CHOLHDL 3.4 12/27/2021 1525   CHOLHDL 6.8 (H) 10/09/2016 1529   VLDL 38 (H) 10/09/2016 1529   LDLCALC 95 12/27/2021 1525  Clinical Atherosclerotic Cardiovascular Disease (ASCVD): No  The 10-year ASCVD risk score (Arnett DK, et al., 2019) is: 15.7%   Values used to calculate the score:     Age: 67 years     Sex: Female     Is Non-Hispanic African American: Yes     Diabetic: Yes     Tobacco smoker: No     Systolic Blood Pressure: 150 mmHg     Is BP treated: No     HDL Cholesterol: 47 mg/dL     Total Cholesterol: 162 mg/dL   A/P:  LIBERATE Study:  - 8 sensors provided for a 3 month supply. Educated to contact the office if the sensor falls off early and replacements are needed  before their next study appointment.   Diabetes longstanding currently uncontrolled, but progressing towards control. Patient is  able to verbalize appropriate hypoglycemia management plan. Medication adherence appears okay, nonadherent to metformin and dulaglutide (Trulicity) due to running out of both medications ~2 weeks prior. Control is suboptimal due to recent nonadherence to metformin and dulaglutide (Trulicity) resulting in suboptimal management along with suboptimal medication regimen. -Increased insulin Lantus (glargine) from 8 units once daily to 10 units once daily -Continued dapagliflozin (Farxiga) 10 mg once daily, dulaglutide (Trulicity) 4.5 mg once weekly, metformin 500 mg 2 tablets twice daily - encouraged adherence to metformin and dulaglutide (Trulicity) -Patient educated on purpose, proper use, and potential adverse effects.  -Extensively discussed pathophysiology of diabetes, recommended lifestyle interventions, dietary effects on blood sugar control - discussed dietary changes to avoid sustained hyperglycemia  -Counseled on s/sx of and management of hypoglycemia.  -Next A1c anticipated 08/27/2023.   ASCVD risk - primary prevention in patient with diabetes. Last LDL is 95 (12/27/2021)  not at goal of <70 mg/dL. ASCVD risk factors include T2DM, HLD.  -Continued rosuvastatin 20 mg once daily  Elevated BP in office today of two readings 156/86 and 150/78. Blood pressure goal of <130/80 mmHg. Patient is not taking any BP medications at this time.  UACR was evaluated last year.  -Encouraged starting log of BP measurements at home to bring to next visit  Patient requested and sent in refills of metformin, dulaglutide (Trulicity), venlafaxine to patient's preferred pharmacy  Written patient instructions provided. Patient verbalized understanding of treatment plan.  Total time in face to face counseling 49 minutes.    Follow-up:  Pharmacist 08/27/2023 PCP clinic visit in [place].   Patient seen with Lendon Ka, PharmD Candidate and Shona Simpson, PharmD Candidate.Marland Kitchen

## 2023-07-09 NOTE — Assessment & Plan Note (Signed)
LIBERATE Study:  - 8 sensors provided for a 3 month supply. Educated to contact the office if the sensor falls off early and replacements are needed before their next study appointment.   Diabetes longstanding currently uncontrolled, but progressing towards control. Patient is  able to verbalize appropriate hypoglycemia management plan. Medication adherence appears okay, nonadherent to metformin and dulaglutide (Trulicity) due to running out of both medications ~2 weeks prior. Control is suboptimal due to recent nonadherence to metformin and dulaglutide (Trulicity) resulting in suboptimal management along with suboptimal medication regimen. -Increased insulin Lantus (glargine) from 8 units once daily to 10 units once daily -Continued dapagliflozin (Farxiga) 10 mg once daily, dulaglutide (Trulicity) 4.5 mg once weekly, metformin 500 mg 2 tablets twice daily - encouraged adherence to metformin and dulaglutide (Trulicity) -Patient educated on purpose, proper use, and potential adverse effects.

## 2023-07-11 NOTE — Progress Notes (Signed)
Reviewed and agree with Dr Koval's plan.   

## 2023-08-11 ENCOUNTER — Other Ambulatory Visit (HOSPITAL_COMMUNITY): Payer: Self-pay

## 2023-08-11 ENCOUNTER — Other Ambulatory Visit: Payer: Self-pay | Admitting: Family Medicine

## 2023-08-11 DIAGNOSIS — E1165 Type 2 diabetes mellitus with hyperglycemia: Secondary | ICD-10-CM

## 2023-08-11 DIAGNOSIS — E119 Type 2 diabetes mellitus without complications: Secondary | ICD-10-CM

## 2023-08-11 MED ORDER — DAPAGLIFLOZIN PROPANEDIOL 10 MG PO TABS
10.0000 mg | ORAL_TABLET | Freq: Every day | ORAL | 3 refills | Status: DC
  Filled 2023-08-11: qty 30, 30d supply, fill #0
  Filled 2023-09-22: qty 30, 30d supply, fill #1
  Filled 2023-11-07: qty 30, 30d supply, fill #2
  Filled 2023-12-03: qty 30, 30d supply, fill #3

## 2023-08-11 MED ORDER — METFORMIN HCL ER 500 MG PO TB24
1000.0000 mg | ORAL_TABLET | Freq: Two times a day (BID) | ORAL | 3 refills | Status: DC
  Filled 2023-08-11: qty 120, 30d supply, fill #0
  Filled 2023-09-22: qty 120, 30d supply, fill #1
  Filled 2023-11-07: qty 120, 30d supply, fill #2
  Filled 2023-12-03: qty 120, 30d supply, fill #3

## 2023-08-15 ENCOUNTER — Other Ambulatory Visit (HOSPITAL_COMMUNITY): Payer: Self-pay

## 2023-08-27 ENCOUNTER — Encounter: Payer: Self-pay | Admitting: Pharmacist

## 2023-08-27 ENCOUNTER — Ambulatory Visit: Payer: 59 | Admitting: Pharmacist

## 2023-08-27 ENCOUNTER — Other Ambulatory Visit (HOSPITAL_COMMUNITY): Payer: Self-pay

## 2023-08-27 VITALS — BP 148/86 | HR 90 | Wt 166.0 lb

## 2023-08-27 DIAGNOSIS — E1165 Type 2 diabetes mellitus with hyperglycemia: Secondary | ICD-10-CM

## 2023-08-27 MED ORDER — LANTUS SOLOSTAR 100 UNIT/ML ~~LOC~~ SOPN
12.0000 [IU] | PEN_INJECTOR | Freq: Every day | SUBCUTANEOUS | 4 refills | Status: AC
  Filled 2023-08-27: qty 15, 125d supply, fill #0
  Filled 2024-01-23: qty 15, 125d supply, fill #1
  Filled 2024-06-15: qty 15, 125d supply, fill #2

## 2023-08-27 NOTE — Progress Notes (Signed)
 S:     Chief Complaint  Frances Wilson presents with   Medication Management    Liberate - CGM Liberate   58 y.o. female who presents for diabetes evaluation, education, and management. Frances Wilson arrives in good good spirits and presents without any assistance.   Frances Wilson was referred and last seen by Primary Care Provider, Dr. CHARLENA Pinal, on 03/30/2024.   LIberate study visit - 3 Month 06/2023  At last visit, Lantus  (insulin  glargine) increased from 8 to 10 units daily.   Frances Wilson reports Diabetes was diagnosed 14 Years ago.   Current diabetes medications include: dapagliflozin  (Farxiga ) 10 mg once daily, dulaglutide  (Trulicity ) 4.5 mg once weekly, metformin  500 mg 2 tablets twice daily, insulin  Lantus  (glargine) 8 units once daily Current hypertension medications include: None Current hyperlipidemia medications include: rosuvastatin  (Crestor ) 20 mg daily  Frances Wilson reports adherence to taking all medications as prescribed.  However, desires to take the least amount of medications possible.   Do you feel that your medications are working for you? yes Have you been experiencing any side effects to the medications prescribed? no Insurance coverage: [employer]  Frances Wilson denies hypoglycemic events.  Reported home fasting blood sugars: good   O:   Review of Systems  All other systems reviewed and are negative.   Physical Exam Vitals reviewed.  Constitutional:      Appearance: Normal appearance.  Pulmonary:     Effort: Pulmonary effort is normal.  Neurological:     Mental Status: She is alert.  Psychiatric:        Mood and Affect: Mood normal.        Behavior: Behavior normal.        Thought Content: Thought content normal.        Judgment: Judgment normal.    Libre3 CGM Download today 08/27/2023 % Time CGM is active: 85% Average Glucose: 195 mg/dL Glucose Management Indicator: 8.0  Glucose Variability: 27% (goal <36%) Time in Goal:  - Time in range 70-180: 45% - Time above range:  54% - Time below range: 1% (with dysfunctional sensor) Observed patterns:   Lab Results  Component Value Date   HGBA1C 8.2 (A) 07/09/2023   Vitals:   08/27/23 0857 08/27/23 0858  BP: (!) 150/85 (!) 148/86  Pulse: 90   SpO2: 100%     Lipid Panel     Component Value Date/Time   CHOL 162 12/27/2021 1525   TRIG 112 12/27/2021 1525   HDL 47 12/27/2021 1525   CHOLHDL 3.4 12/27/2021 1525   CHOLHDL 6.8 (H) 10/09/2016 1529   VLDL 38 (H) 10/09/2016 1529   LDLCALC 95 12/27/2021 1525    A/P: Diabetes longstanding, currently much improved with GMI at 8.0  Frances Wilson is tolerating insulin  and able to verbalize appropriate hypoglycemia management plan (no symptomatic episodes identified). Medication adherence appears good. Remains higher than goal glucose readings.  -Increased dose of basal insulin  Lantus  (insulin  glargine) from 10 to 12 units once daily.  -Continued GLP-1  Trulicity  (dulaglutide ) 4.5mg  weekly .  -Continued SGLT2-I Farxiga  (dapagliflozin ) 10 mg. Counseled on sick day rules. -Continued metformin  1000mg  BID.  -Frances Wilson educated on purpose, proper use, and potential adverse effects.  -Extensively discussed pathophysiology of diabetes, recommended lifestyle interventions, dietary effects on blood sugar control.  - Two additional Libre 3 sensors provided.  - Liberate 6 month visit in February - discussed insulin  use leads to coverage of CGM after study.   Hypertension undiagnosed and elevated in office. Blood pressure goal of <130/80  mmHg.  -Plan to discuss initiation of antihypertensive at next visit.   Written Frances Wilson instructions provided. Frances Wilson verbalized understanding of treatment plan.  Total time in face to face counseling 31 minutes.    Follow-up:  Pharmacist 10/13/2023 PCP clinic visit in March or April 2025 Frances Wilson seen with Owens Cowing, PharmD Candidate and Madelaine Darnel, PharmD Candidate.

## 2023-08-27 NOTE — Patient Instructions (Addendum)
 It was nice to see you today!  Keep up the great work!!!  You are doing fabulous!  Your goal blood sugar is 80-130 before eating and less than 180 after eating.  Medication Changes: Increase Lantus  (insulin  glargine) from 10 to 12 units once daily  Continue all other medication the same. Please make sure you keep taking all of your medications.   Keep up the good work with diet and exercise. Aim for a diet full of vegetables, fruit and lean meats (chicken, turkey, fish). Try to limit salt intake by eating fresh or frozen vegetables (instead of canned), rinse canned vegetables prior to cooking and do not add any additional salt to meals.

## 2023-08-27 NOTE — Assessment & Plan Note (Signed)
 Diabetes longstanding, currently much improved with GMI at 8.0  Patient is tolerating insulin  and able to verbalize appropriate hypoglycemia management plan (no symptomatic episodes identified). Medication adherence appears good. Remains higher than goal glucose readings.  -Increased dose of basal insulin  Lantus  (insulin  glargine) from 10 to 12 units once daily.  -Continued GLP-1  Trulicity  (dulaglutide ) 4.5mg  weekly .  -Continued SGLT2-I Farxiga  (dapagliflozin ) 10 mg. Counseled on sick day rules. -Continued metformin  1000mg  BID.  -Patient educated on purpose, proper use, and potential adverse effects.  -Extensively discussed pathophysiology of diabetes, recommended lifestyle interventions, dietary effects on blood sugar control.  - Two additional Libre 3 sensors provided.

## 2023-08-28 NOTE — Progress Notes (Signed)
 Reviewed and agree with Dr Macky Lower plan.

## 2023-09-13 ENCOUNTER — Other Ambulatory Visit (HOSPITAL_COMMUNITY): Payer: Self-pay

## 2023-09-22 ENCOUNTER — Other Ambulatory Visit: Payer: Self-pay

## 2023-09-22 ENCOUNTER — Other Ambulatory Visit (HOSPITAL_COMMUNITY): Payer: Self-pay

## 2023-09-23 ENCOUNTER — Other Ambulatory Visit (HOSPITAL_COMMUNITY): Payer: Self-pay

## 2023-09-27 ENCOUNTER — Other Ambulatory Visit (HOSPITAL_COMMUNITY): Payer: Self-pay

## 2023-10-13 ENCOUNTER — Encounter: Payer: Self-pay | Admitting: Pharmacist

## 2023-10-13 ENCOUNTER — Other Ambulatory Visit (HOSPITAL_COMMUNITY): Payer: Self-pay

## 2023-10-13 ENCOUNTER — Encounter (INDEPENDENT_AMBULATORY_CARE_PROVIDER_SITE_OTHER): Payer: 59 | Admitting: Pharmacist

## 2023-10-13 VITALS — BP 124/74 | HR 75

## 2023-10-13 DIAGNOSIS — E785 Hyperlipidemia, unspecified: Secondary | ICD-10-CM

## 2023-10-13 DIAGNOSIS — Z794 Long term (current) use of insulin: Secondary | ICD-10-CM

## 2023-10-13 DIAGNOSIS — E1165 Type 2 diabetes mellitus with hyperglycemia: Secondary | ICD-10-CM

## 2023-10-13 LAB — POCT GLYCOSYLATED HEMOGLOBIN (HGB A1C): HbA1c, POC (controlled diabetic range): 8 % — AB (ref 0.0–7.0)

## 2023-10-13 MED ORDER — FREESTYLE LIBRE 3 PLUS SENSOR MISC
11 refills | Status: AC
  Filled 2023-10-13: qty 2, 30d supply, fill #0
  Filled 2023-11-16: qty 2, 30d supply, fill #1
  Filled 2023-12-31: qty 2, 30d supply, fill #2
  Filled 2024-01-23: qty 2, 30d supply, fill #3
  Filled 2024-03-04: qty 2, 30d supply, fill #4
  Filled 2024-04-23 – 2024-05-12 (×3): qty 2, 30d supply, fill #5
  Filled 2024-06-21: qty 2, 30d supply, fill #6
  Filled 2024-07-17: qty 2, 30d supply, fill #7
  Filled 2024-08-25 – 2024-09-09 (×2): qty 2, 30d supply, fill #8

## 2023-10-13 NOTE — Patient Instructions (Signed)
 It was nice to see you today!  Your goal blood sugar is 80-130 before eating and less than 180 after eating.  Medication Changes: Continue all other medication the same.   Keep up the good work with diet and exercise. Aim for a diet full of vegetables, fruit and lean meats (chicken, [place], fish). Try to limit salt intake by eating fresh or frozen vegetables (instead of canned), rinse canned vegetables prior to cooking and do not add any additional salt to meals.

## 2023-10-13 NOTE — Assessment & Plan Note (Signed)
 ASCVD risk - primary prevention in patient with diabetes. Last LDL is 95 not at goal of <70 mg/dL. ASCVD risk factors include HLD, age, diabetes and 10-year ASCVD risk score of 9.2%.  -Continued rosuvastatin 20 mg daily.

## 2023-10-13 NOTE — Assessment & Plan Note (Signed)
 Diabetes longstanding currently improved (A1c 8.0% 10/13/23). Patient is able to verbalize appropriate hypoglycemia management plan. Medication adherence appears good.  -Continued basal insulin Lantus (insulin glargine)12 units daily -Continued GLP-1 Trulicity (generic dulaglutide) 4.5 mg weekly.  -Continued SGLT2-I Farxiga (generic dapagliflozin) 10 mg daily. Counseled on sick day rules. -Continued metformin 500 mg to 2 tablets BID.  -Patient educated on purpose, proper use, and potential adverse effects.  -Extensively discussed pathophysiology of diabetes, recommended lifestyle interventions, dietary effects on blood sugar control.  -Counseled on s/sx of and management of hypoglycemia.

## 2023-10-13 NOTE — Research (Signed)
 S:     Chief Complaint  Patient presents with   Medication Management    Diabetes - Liberate 6 month   58 y.o. female who presents for diabetes evaluation, education, and management in the context of the LIBERATE Study.   PMH is significant for diabetes, HLD.  Patient was referred and last seen by Primary Care Provider, Dr. Samara Deist, on 04/01/2023.   At last visit, increased dose of basal insulin Lantus (insulin glargine) from 10 to 12 units once daily. .   Today, patient arrives in good spirits and presents without any assistance.   Patient reports Diabetes was diagnosed in 14 years ago.   Current diabetes medications include: dapagliflozin (Farxiga) 10 mg once daily, dulaglutide (Trulicity) 4.5 mg once weekly, metformin 500 mg 2 tablets twice daily, insulin Lantus (glargine) 12 units once daily  Current hypertension medications include: none Current hyperlipidemia medications include: rosuvastatin (Crestor) 20 mg daily   Patient reports adherence to taking all medications as prescribed.   Do you feel that your medications are working for you? yes Have you been experiencing any side effects to the medications prescribed? no Do you have any problems obtaining medications due to transportation or finances? no Insurance coverage: [employer]  Patient reports hypoglycemic events.  Within the past 12 months, did you worry whether your food would run out before you got money to buy more? no Within the past 12 months, did the food you bought run out, and you didn't have money to get more? no  O:   Review of Systems  All other systems reviewed and are negative.   Physical Exam Vitals reviewed.  Constitutional:      Appearance: Normal appearance.  Pulmonary:     Effort: Pulmonary effort is normal.  Neurological:     Mental Status: She is alert.  Psychiatric:        Mood and Affect: Mood normal.        Behavior: Behavior normal.        Thought Content: Thought content  normal.        Judgment: Judgment normal.     Lab Results  Component Value Date   HGBA1C 8.0 (A) 10/13/2023    POC A1c Today: 8.0   Libre3 CGM Download today 09/30/23 - 10/13/23 % Time CGM is active: 20% Average Glucose: 166 mg/dL Glucose Management Indicator: --  Glucose Variability: 19.8% (goal <36%) Time in Goal:  - Time in range 70-180: 69% - Time above range: 31% - Time below range: 0%   Vitals:   10/13/23 0840  BP: 124/74  Pulse: 75  SpO2: 100%     Lipid Panel     Component Value Date/Time   CHOL 162 12/27/2021 1525   TRIG 112 12/27/2021 1525   HDL 47 12/27/2021 1525   CHOLHDL 3.4 12/27/2021 1525   CHOLHDL 6.8 (H) 10/09/2016 1529   VLDL 38 (H) 10/09/2016 1529   LDLCALC 95 12/27/2021 1525    Clinical Atherosclerotic Cardiovascular Disease (ASCVD): No  The 10-year ASCVD risk score (Arnett DK, et al., 2019) is: 9.2%   Values used to calculate the score:     Age: 22 years     Sex: Female     Is Non-Hispanic African American: Yes     Diabetic: Yes     Tobacco smoker: No     Systolic Blood Pressure: 124 mmHg     Is BP treated: No     HDL Cholesterol: 47 mg/dL  Total Cholesterol: 162 mg/dL   A/P:  LIBERATE Study:  - Provided new prescription for continued supply of Libre 3+ sensors as patient is now taking insulin.   Diabetes longstanding currently improved (A1c 8.0% 10/13/23). Patient is able to verbalize appropriate hypoglycemia management plan. Medication adherence appears good.  -Continued basal insulin Lantus (insulin glargine)12 units daily -Continued GLP-1 Trulicity (generic dulaglutide) 4.5 mg weekly.  -Continued SGLT2-I Farxiga (generic dapagliflozin) 10 mg daily. Counseled on sick day rules. -Continued metformin 500 mg to 2 tablets BID.  -Patient educated on purpose, proper use, and potential adverse effects.  -Extensively discussed pathophysiology of diabetes, recommended lifestyle interventions, dietary effects on blood sugar control.   -Counseled on s/sx of and management of hypoglycemia.  -Next A1c anticipated May 2025.   ASCVD risk - primary prevention in patient with diabetes. Last LDL is 95 not at goal of <70 mg/dL. ASCVD risk factors include HLD, age, diabetes and 10-year ASCVD risk score of 9.2%.  -Continued rosuvastatin 20 mg daily.   Written patient instructions provided. Patient verbalized understanding of treatment plan.  Total time in face to face counseling 18 minutes.    Follow-up:  Pharmacist TBD. PCP clinic visit in 04/01 with Dr. Samara Deist  Patient seen with Lavona Mound, PharmD Candidate and Pearletha Forge, PharmD Candidate. Marland Kitchen

## 2023-10-14 NOTE — Progress Notes (Signed)
 Reviewed and agree.

## 2023-10-18 ENCOUNTER — Other Ambulatory Visit (HOSPITAL_COMMUNITY): Payer: Self-pay

## 2023-11-07 ENCOUNTER — Other Ambulatory Visit (HOSPITAL_COMMUNITY): Payer: Self-pay

## 2023-11-07 ENCOUNTER — Other Ambulatory Visit: Payer: Self-pay | Admitting: Family Medicine

## 2023-11-07 ENCOUNTER — Other Ambulatory Visit: Payer: Self-pay

## 2023-11-07 MED ORDER — TRULICITY 4.5 MG/0.5ML ~~LOC~~ SOAJ
4.5000 mg | SUBCUTANEOUS | 3 refills | Status: DC
  Filled 2023-11-07: qty 2, 28d supply, fill #0
  Filled 2023-12-03: qty 2, 28d supply, fill #1
  Filled 2024-01-23: qty 2, 28d supply, fill #2
  Filled 2024-03-04: qty 2, 28d supply, fill #3

## 2023-11-08 ENCOUNTER — Other Ambulatory Visit (HOSPITAL_COMMUNITY): Payer: Self-pay

## 2023-11-17 ENCOUNTER — Other Ambulatory Visit (HOSPITAL_BASED_OUTPATIENT_CLINIC_OR_DEPARTMENT_OTHER): Payer: Self-pay

## 2023-11-18 ENCOUNTER — Encounter: Payer: Self-pay | Admitting: Student

## 2023-11-18 ENCOUNTER — Ambulatory Visit: Payer: 59 | Admitting: Student

## 2023-11-18 VITALS — BP 130/80 | Ht 67.0 in | Wt 165.6 lb

## 2023-11-18 DIAGNOSIS — E785 Hyperlipidemia, unspecified: Secondary | ICD-10-CM

## 2023-11-18 DIAGNOSIS — F332 Major depressive disorder, recurrent severe without psychotic features: Secondary | ICD-10-CM

## 2023-11-18 DIAGNOSIS — E1165 Type 2 diabetes mellitus with hyperglycemia: Secondary | ICD-10-CM

## 2023-11-18 DIAGNOSIS — Z7985 Long-term (current) use of injectable non-insulin antidiabetic drugs: Secondary | ICD-10-CM

## 2023-11-18 DIAGNOSIS — F900 Attention-deficit hyperactivity disorder, predominantly inattentive type: Secondary | ICD-10-CM

## 2023-11-18 DIAGNOSIS — F419 Anxiety disorder, unspecified: Secondary | ICD-10-CM

## 2023-11-18 DIAGNOSIS — F32A Depression, unspecified: Secondary | ICD-10-CM

## 2023-11-18 DIAGNOSIS — F4312 Post-traumatic stress disorder, chronic: Secondary | ICD-10-CM | POA: Diagnosis not present

## 2023-11-18 LAB — POCT GLYCOSYLATED HEMOGLOBIN (HGB A1C): HbA1c, POC (controlled diabetic range): 7.9 % — AB (ref 0.0–7.0)

## 2023-11-18 NOTE — Progress Notes (Signed)
    SUBJECTIVE:   CHIEF COMPLAINT / HPI:   Frances Wilson is a 58 y.o. presenting for follow up.   T2DM: Follows with pharmacy clinic for diabetes.  Current medications includes Farxiga 10 mg, Trulicity 4.5 mg weekly, Lantus 12 units daily, and metformin 1000 mg 2 times a day. Due for eye exam.  Doing well with medication and not having any reported side effects.  ADHD/depression/anxiety: Had a long struggle in dealing with her anxiety and ADHD.  She has previously been on 4-5 different SSRIs and we have trialed Effexor for the last 6 months.  Patient reports she discontinued the Effexor 3 months ago and has had no change in her anxiety or depression.  She reports her ADHD is the most distressing she is struggling to keep up with her tasks at work.  She has been on medication for ADHD in the past and she is interested in restarting.  She reports she has been doing well and does not have suicidal ideation.  PERTINENT  PMH / PSH: reviewed and updated.  OBJECTIVE:   BP 130/80   Ht 5\' 7"  (1.702 m)   Wt 165 lb 9.6 oz (75.1 kg)   SpO2 100%   BMI 25.94 kg/m   Well-appearing, no acute distress Cardio: Regular rate, regular rhythm, no murmurs on exam. Pulm: Clear, no wheezing, no crackles. No increased work of breathing Abdominal: bowel sounds present, soft, non-tender, non-distended Extremities: no peripheral edema  Neuro: alert and oriented x3, speech normal in content, no facial asymmetry, strength intact and equal bilaterally in UE and LE, pupils equal and reactive to light.  Psych:  Cognition and judgment appear intact. Alert, communicative  and cooperative with normal attention span and concentration. No apparent delusions, illusions, hallucinations    ASSESSMENT/PLAN:   Assessment & Plan Type 2 diabetes mellitus with hyperglycemia, without long-term current use of insulin (HCC) A1c improved to 7.9 today.  Continue metformin 1000 mg twice daily, Farxiga 10 mg daily, 12 units Lantus  daily, Tresiba 4.5 mg weekly.  Urine microalbumin collected today along with BMP.  Follow-up in 3 months Hyperlipidemia, unspecified hyperlipidemia type Stable on Crestor.  Recheck lipid panel today Attention deficit hyperactivity disorder (ADHD), predominantly inattentive type Most concerning for patient.  Due to her concurrent anxiety and complicated medication history shared decision making with patient to send referral to psychiatry for management.  Reassuringly she is doing well off of Effexor.  She is taking trazodone 25 mg as needed. Severe episode of recurrent major depressive disorder, without psychotic features (HCC)  Chronic post-traumatic stress disorder (PTSD)  Anxiety and depression      Glendale Chard, DO [facility]

## 2023-11-18 NOTE — Assessment & Plan Note (Signed)
 A1c improved to 7.9 today.  Continue metformin 1000 mg twice daily, Farxiga 10 mg daily, 12 units Lantus daily, Tresiba 4.5 mg weekly.  Urine microalbumin collected today along with BMP.  Follow-up in 3 months

## 2023-11-18 NOTE — Patient Instructions (Addendum)
 It was great to see you today!   Check back in with me in 3 months.   I have sent a referral to psychiatry.   No future appointments.  Please arrive 15 minutes before your appointment to ensure smooth check in process.    Please call the clinic at [phone] if your symptoms worsen or you have any concerns.  Thank you for allowing me to participate in your care, Dr. Glendale Chard [facility]

## 2023-11-18 NOTE — Assessment & Plan Note (Signed)
 Stable on Crestor.  Recheck lipid panel today

## 2023-11-18 NOTE — Assessment & Plan Note (Signed)
 Most concerning for patient.  Due to her concurrent anxiety and complicated medication history shared decision making with patient to send referral to psychiatry for management.  Reassuringly she is doing well off of Effexor.  She is taking trazodone 25 mg as needed.

## 2023-11-19 LAB — BASIC METABOLIC PANEL WITH GFR
BUN/Creatinine Ratio: 15 (ref 9–23)
BUN: 11 mg/dL (ref 6–24)
CO2: 24 mmol/L (ref 20–29)
Calcium: 10.5 mg/dL — ABNORMAL HIGH (ref 8.7–10.2)
Chloride: 99 mmol/L (ref 96–106)
Creatinine, Ser: 0.73 mg/dL (ref 0.57–1.00)
Glucose: 100 mg/dL — ABNORMAL HIGH (ref 70–99)
Potassium: 4.5 mmol/L (ref 3.5–5.2)
Sodium: 141 mmol/L (ref 134–144)
eGFR: 95 mL/min/{1.73_m2} (ref >59)

## 2023-11-19 LAB — MICROALBUMIN / CREATININE URINE RATIO
Creatinine, Urine: 55.5 mg/dL
Microalb/Creat Ratio: 33 mg/g{creat} — ABNORMAL HIGH (ref 0–29)
Microalbumin, Urine: 18.2 ug/mL

## 2023-11-19 LAB — LIPID PANEL
Chol/HDL Ratio: 2.9 ratio (ref 0.0–4.4)
Cholesterol, Total: 135 mg/dL (ref 100–199)
HDL: 46 mg/dL (ref >39)
LDL Chol Calc (NIH): 72 mg/dL (ref 0–99)
Triglycerides: 88 mg/dL (ref 0–149)
VLDL Cholesterol Cal: 17 mg/dL (ref 5–40)

## 2023-11-20 ENCOUNTER — Encounter: Payer: Self-pay | Admitting: Student

## 2023-11-22 ENCOUNTER — Other Ambulatory Visit (HOSPITAL_COMMUNITY): Payer: Self-pay

## 2023-12-02 IMAGING — MG MM DIGITAL SCREENING BILAT W/ TOMO AND CAD
6 of 10 series · 6 of 30 positions shown · non-contrast
Comparison: Previous exam(s).

CLINICAL DATA: Screening.

EXAM:
DIGITAL SCREENING BILATERAL MAMMOGRAM WITH TOMOSYNTHESIS AND CAD
TECHNIQUE: Bilateral screening digital craniocaudal and mediolateral oblique
mammograms were obtained. Bilateral screening digital breast
tomosynthesis was performed. The images were evaluated with
computer-aided detection.

[R MLO synth-2D (1 of 2)]
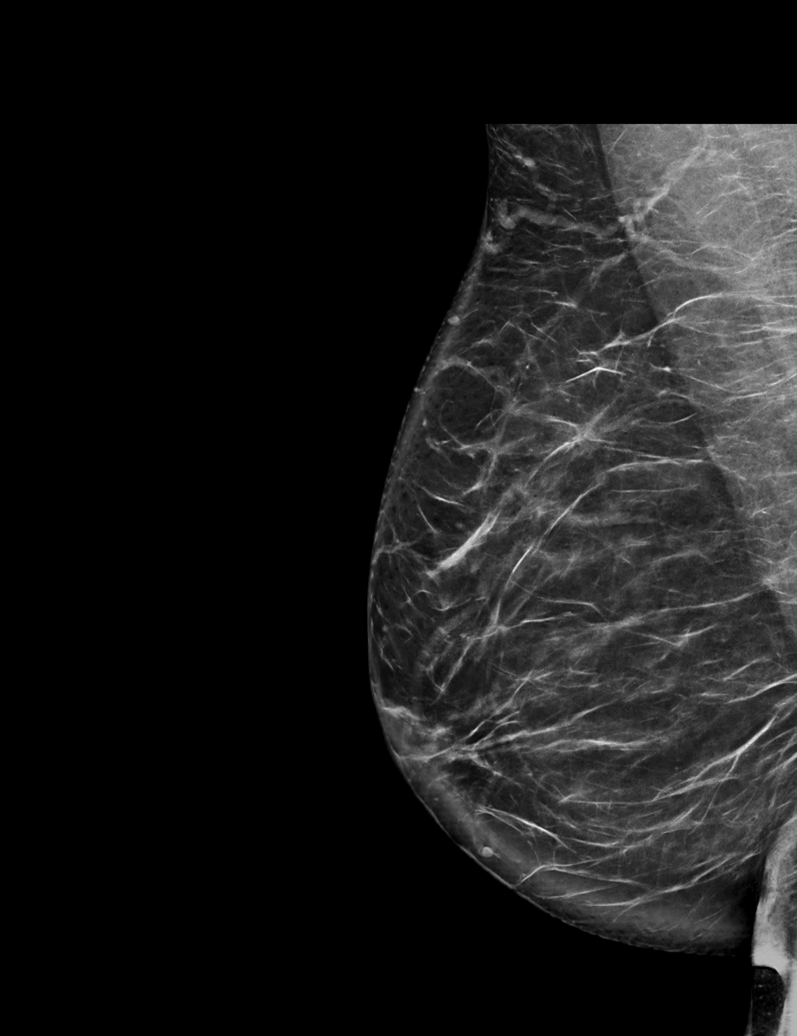

[R MLO synth-2D (2 of 2)]
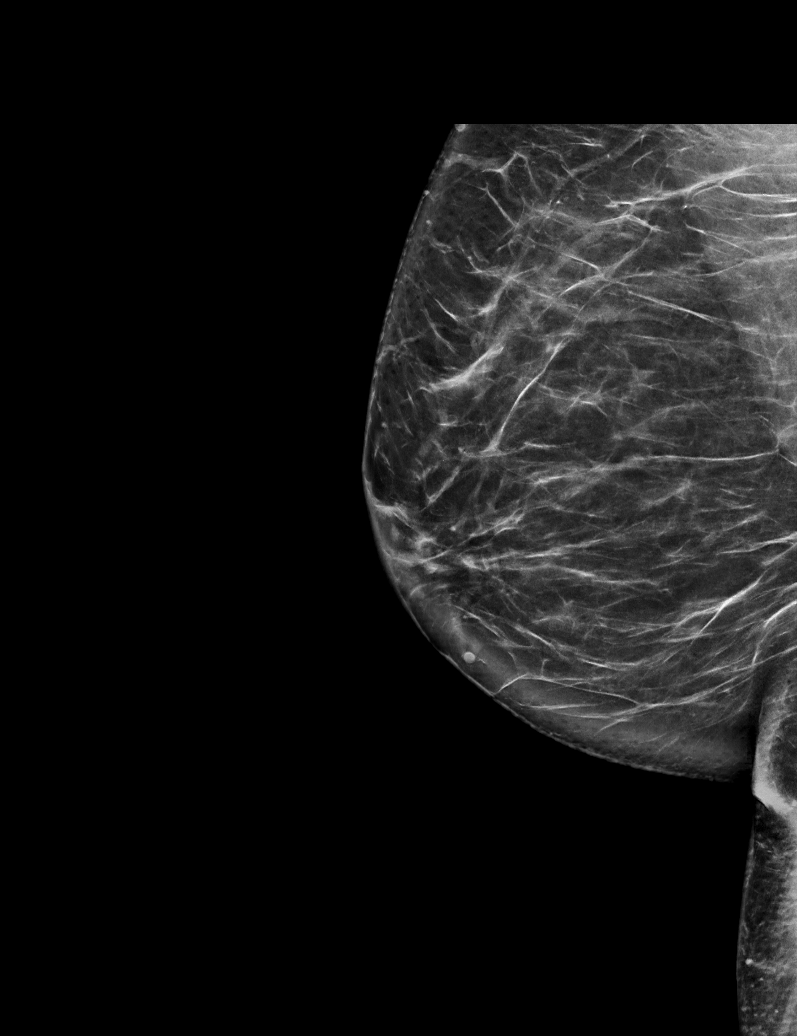

[R CC synth-2D]
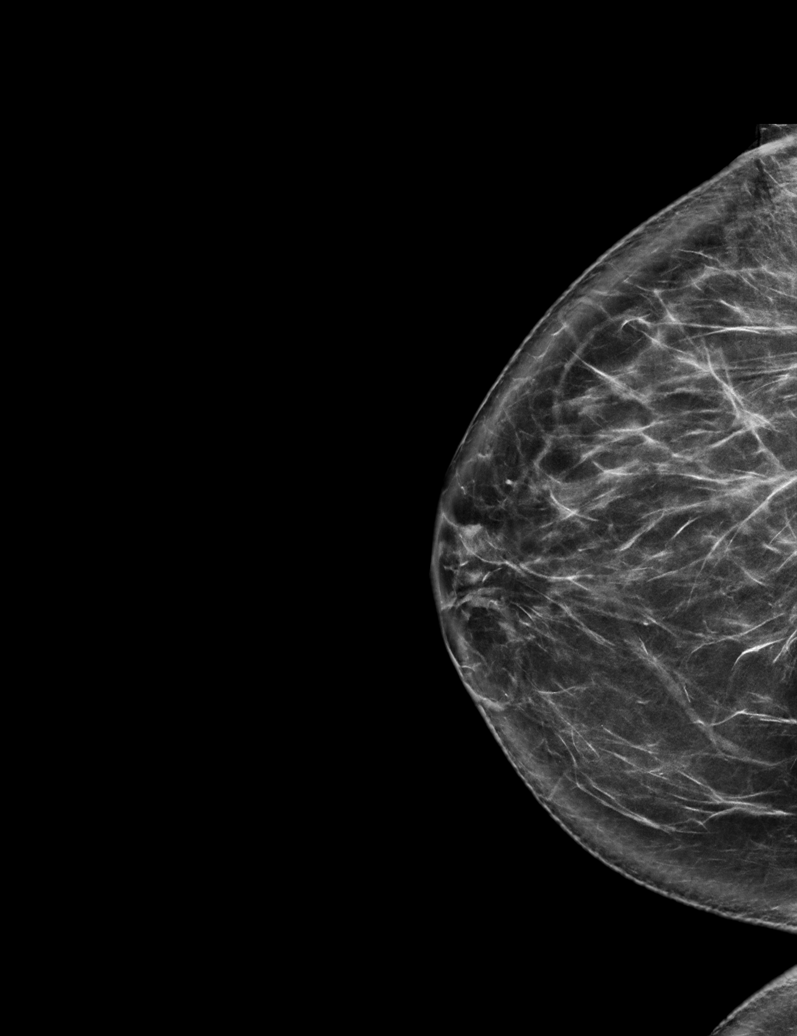

[L CC synth-2D]
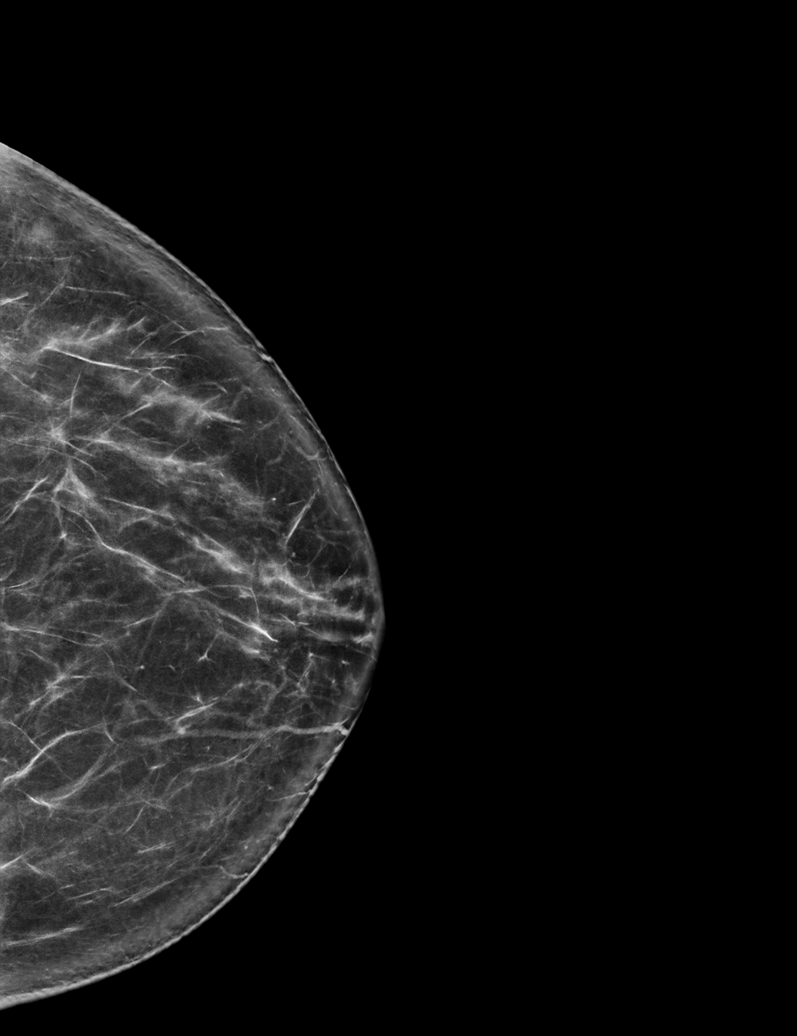

[L MLO synth-2D]
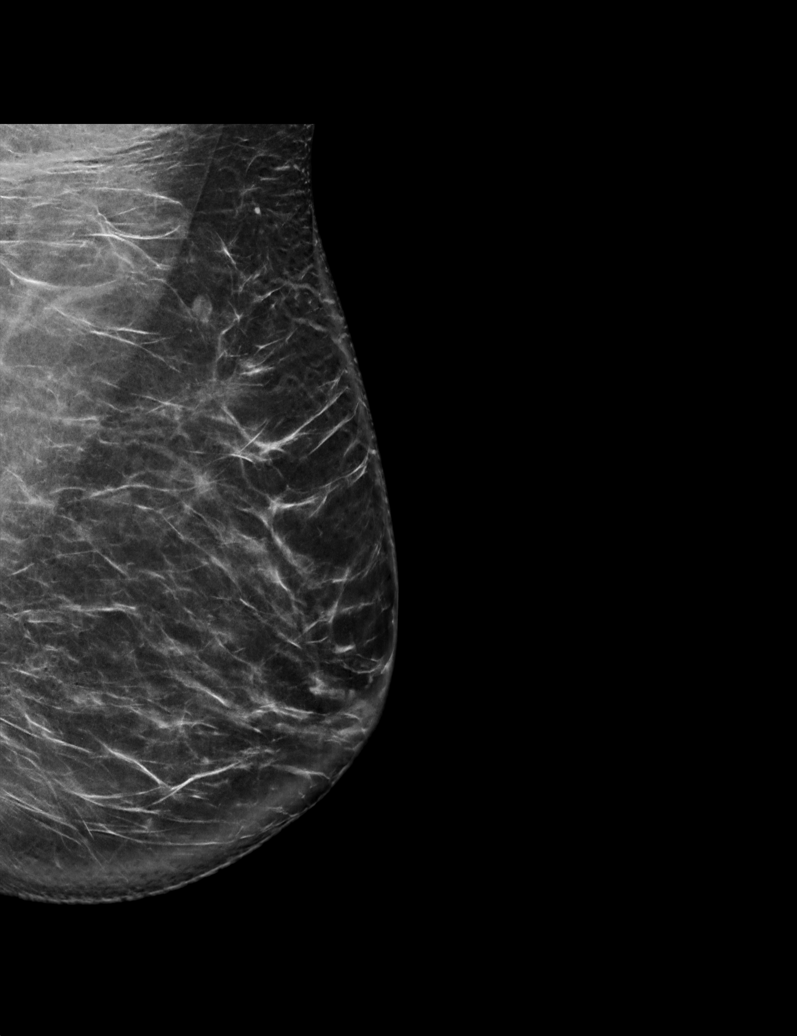

[R CC tomo · tomo slice 41/80.0]
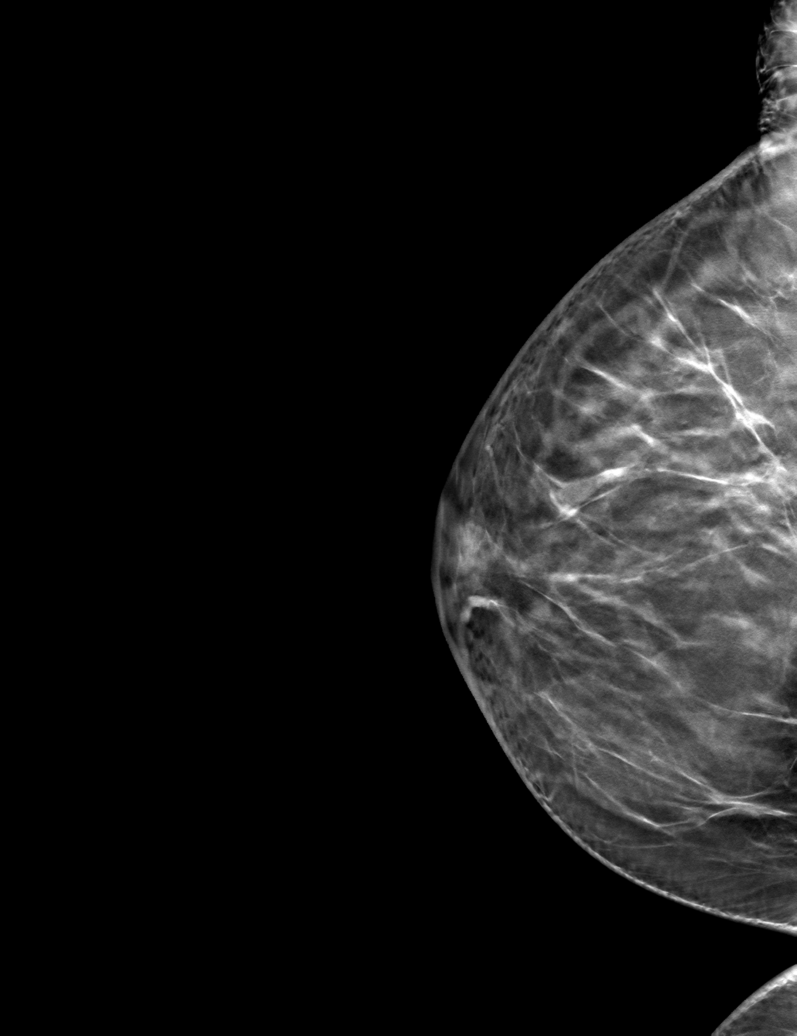

[6 of 30 positions shown; findings below may reference images not displayed]

ACR Breast Density Category b: There are scattered areas of
fibroglandular density.
FINDINGS: There are no findings suspicious for malignancy.
IMPRESSION: No mammographic evidence of malignancy. A result letter of this
screening mammogram will be mailed directly to the patient.

RECOMMENDATION:
Screening mammogram in one year. (Code:51-O-LD2)

BI-RADS CATEGORY  1: Negative.

## 2023-12-31 ENCOUNTER — Other Ambulatory Visit: Payer: Self-pay | Admitting: Student

## 2023-12-31 ENCOUNTER — Telehealth (HOSPITAL_COMMUNITY): Payer: Self-pay | Admitting: *Deleted

## 2023-12-31 ENCOUNTER — Telehealth (HOSPITAL_COMMUNITY): Payer: Self-pay | Admitting: Family

## 2023-12-31 ENCOUNTER — Other Ambulatory Visit (HOSPITAL_COMMUNITY): Payer: Self-pay

## 2023-12-31 ENCOUNTER — Ambulatory Visit (HOSPITAL_BASED_OUTPATIENT_CLINIC_OR_DEPARTMENT_OTHER): Payer: Self-pay | Admitting: Family

## 2023-12-31 ENCOUNTER — Other Ambulatory Visit: Payer: Self-pay

## 2023-12-31 DIAGNOSIS — R41840 Attention and concentration deficit: Secondary | ICD-10-CM

## 2023-12-31 DIAGNOSIS — E119 Type 2 diabetes mellitus without complications: Secondary | ICD-10-CM

## 2023-12-31 MED ORDER — ATOMOXETINE HCL 10 MG PO CAPS: ORAL_CAPSULE | ORAL | 0 refills | Status: DC

## 2023-12-31 MED ORDER — METFORMIN HCL ER 500 MG PO TB24
1000.0000 mg | ORAL_TABLET | Freq: Two times a day (BID) | ORAL | 3 refills | Status: DC
  Filled 2023-12-31: qty 120, 30d supply, fill #0
  Filled 2024-01-23: qty 120, 30d supply, fill #1
  Filled 2024-04-23 – 2024-05-12 (×3): qty 120, 30d supply, fill #2
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
## 2023-12-31 NOTE — Telephone Encounter (Signed)
 PA for Strattera 10 mg capsules submitted to [facility] vis Cover My Meds portal. Awaiting determination.

## 2024-01-01 NOTE — Telephone Encounter (Signed)
 Nope.

## 2024-01-23 ENCOUNTER — Other Ambulatory Visit: Payer: Self-pay | Admitting: Student

## 2024-01-23 DIAGNOSIS — E1165 Type 2 diabetes mellitus with hyperglycemia: Secondary | ICD-10-CM

## 2024-01-23 MED ORDER — DAPAGLIFLOZIN PROPANEDIOL 10 MG PO TABS
10.0000 mg | ORAL_TABLET | Freq: Every day | ORAL | 3 refills | Status: DC
  Filled 2024-01-23: qty 30, 30d supply, fill #0
  Filled 2024-03-04: qty 30, 30d supply, fill #1
  Filled 2024-04-23 – 2024-05-12 (×3): qty 30, 30d supply, fill #2
  Filled 2024-06-21: qty 30, 30d supply, fill #3

## 2024-01-24 ENCOUNTER — Other Ambulatory Visit (HOSPITAL_COMMUNITY): Payer: Self-pay

## 2024-01-26 ENCOUNTER — Other Ambulatory Visit (HOSPITAL_COMMUNITY): Payer: Self-pay

## 2024-01-27 ENCOUNTER — Other Ambulatory Visit: Payer: Self-pay

## 2024-01-27 ENCOUNTER — Emergency Department

## 2024-01-27 ENCOUNTER — Emergency Department
Admission: EM | Admit: 2024-01-27 | Discharge: 2024-01-27 | Disposition: A | Discharge status: Home / Self Care | Attending: Emergency Medicine | Admitting: Emergency Medicine

## 2024-01-27 DIAGNOSIS — I10 Essential (primary) hypertension: Secondary | ICD-10-CM | POA: Insufficient documentation

## 2024-01-27 DIAGNOSIS — R55 Syncope and collapse: Secondary | ICD-10-CM | POA: Insufficient documentation

## 2024-01-27 DIAGNOSIS — E119 Type 2 diabetes mellitus without complications: Secondary | ICD-10-CM | POA: Insufficient documentation

## 2024-01-27 LAB — COMPREHENSIVE METABOLIC PANEL WITH GFR
ALT: 19 U/L (ref 0–44)
AST: 24 U/L (ref 15–41)
Albumin: 3.6 g/dL (ref 3.5–5.0)
Alkaline Phosphatase: 74 U/L (ref 38–126)
Anion gap: 8 (ref 5–15)
BUN: 10 mg/dL (ref 6–20)
CO2: 26 mmol/L (ref 22–32)
Calcium: 9.1 mg/dL (ref 8.9–10.3)
Chloride: 103 mmol/L (ref 98–111)
Creatinine, Ser: 0.71 mg/dL (ref 0.44–1.00)
GFR, Estimated: >60 mL/min (ref >60)
Glucose, Bld: 235 mg/dL — ABNORMAL HIGH (ref 70–99)
Potassium: 3.5 mmol/L (ref 3.5–5.1)
Sodium: 137 mmol/L (ref 135–145)
Total Bilirubin: 0.6 mg/dL (ref 0.0–1.2)
Total Protein: 6.9 g/dL (ref 6.5–8.1)

## 2024-01-27 LAB — CBC WITH DIFFERENTIAL/PLATELET
Abs Immature Granulocytes: 0.03 10*3/uL (ref 0.00–0.07)
Basophils Absolute: 0 10*3/uL (ref 0.0–0.1)
Basophils Relative: 0 %
Eosinophils Absolute: 0.1 10*3/uL (ref 0.0–0.5)
Eosinophils Relative: 1 %
HCT: 39.3 % (ref 36.0–46.0)
Hemoglobin: 12.6 g/dL (ref 12.0–15.0)
Immature Granulocytes: 0 %
Lymphocytes Relative: 15 %
Lymphs Abs: 1.2 10*3/uL (ref 0.7–4.0)
MCH: 26.3 pg (ref 26.0–34.0)
MCHC: 32.1 g/dL (ref 30.0–36.0)
MCV: 81.9 fL (ref 80.0–100.0)
Monocytes Absolute: 0.6 10*3/uL (ref 0.1–1.0)
Monocytes Relative: 7 %
Neutro Abs: 6.2 10*3/uL (ref 1.7–7.7)
Neutrophils Relative %: 77 %
Platelets: 295 10*3/uL (ref 150–400)
RBC: 4.8 MIL/uL (ref 3.87–5.11)
RDW: 13.1 % (ref 11.5–15.5)
WBC: 8.1 10*3/uL (ref 4.0–10.5)
nRBC: 0 % (ref 0.0–0.2)

## 2024-01-27 LAB — URINALYSIS, ROUTINE W REFLEX MICROSCOPIC
Bacteria, UA: NONE SEEN
Bilirubin Urine: NEGATIVE
Glucose, UA: >=500 mg/dL — AB
Ketones, ur: 5 mg/dL — AB
Leukocytes,Ua: NEGATIVE
Nitrite: NEGATIVE
Protein, ur: NEGATIVE mg/dL
Specific Gravity, Urine: 1.017 (ref 1.005–1.030)
pH: 5 (ref 5.0–8.0)

## 2024-01-27 LAB — TROPONIN I (HIGH SENSITIVITY): Troponin I (High Sensitivity): 3 ng/L (ref <18)

## 2024-01-27 NOTE — ED Triage Notes (Signed)
 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 Pt denies hitting her head, pt is diabetic and her CBG is reading 191. Pt report n/v prior to syncopal episode

## 2024-01-27 NOTE — ED Provider Notes (Signed)
 [facility] Provider Note    Event Date/Time   First MD Initiated Contact with Patient 01/27/24 1040     (approximate)   History   Loss of Consciousness   HPI  Frances Wilson is a 58 y.o. female  with history of hypertension, diabetes and as listed in EMR presents to the emergency department for treatment and evaluation after syncopal episode while at work. She didn't feel well and advised her coworker who was able to assist her to the ground just prior to passing out. She then felt that she needed to vomit/have diarrhea and went to the restroom. She did vomit but also passed out again. She has had similar episodes in the past due to glucose alteration, but never this severe. At this time, she denies pain, but reports she still doesn't feel normal. Recently, she has felt sinus congestion and related it to seasonal allergies, but otherwise normal. No cardiac history, no history of PE/DVT. Currently denies chest pain or shortness of breath.      Physical Exam   Triage Vital Signs: ED Triage Vitals  Encounter Vitals Group     BP 01/27/24 1047 (!) 140/78     Systolic BP Percentile --      Diastolic BP Percentile --      Pulse Rate 01/27/24 1047 90     Resp 01/27/24 1047 16     Temp 01/27/24 1047 98 F (36.7 C)     Temp Source 01/27/24 1047 Oral     SpO2 01/27/24 1047 99 %     Weight 01/27/24 1048 165 lb (74.8 kg)     Height 01/27/24 1048 5' 7 (1.702 m)     Head Circumference --      Peak Flow --      Pain Score 01/27/24 1048 0     Pain Loc --      Pain Education --      Exclude from Growth Chart --     Most recent vital signs: Vitals:   01/27/24 1047  BP: (!) 140/78  Pulse: 90  Resp: 16  Temp: 98 F (36.7 C)  SpO2: 99%    General: Awake, no distress. Well appearing. CV:  Good peripheral perfusion.  Resp:  Normal effort.  Abd:  No distention.  Other:  Neurological exam negative for focal abnormality.   ED Results / Procedures /  Treatments   Labs (all labs ordered are listed, but only abnormal results are displayed) Labs Reviewed  URINALYSIS, ROUTINE W REFLEX MICROSCOPIC - Abnormal; Notable for the following components:      Result Value   Color, Urine YELLOW (*)    APPearance HAZY (*)    Glucose, UA >=500 (*)    Hgb urine dipstick SMALL (*)    Ketones, ur 5 (*)    All other components within normal limits  COMPREHENSIVE METABOLIC PANEL WITH GFR - Abnormal; Notable for the following components:   Glucose, Bld 235 (*)    All other components within normal limits  CBC WITH DIFFERENTIAL/PLATELET  TROPONIN I (HIGH SENSITIVITY)  TROPONIN I (HIGH SENSITIVITY)     EKG  Normal sinus rhythm, rate of 86   RADIOLOGY  Image and radiology report reviewed and interpreted by me. Radiology report consistent with the same.  CT of the head and cervical spine are negative for acute concerns.  Chest x-ray is negative for acute cardiopulmonary abnormality.  PROCEDURES:  Critical Care performed: No  Procedures   MEDICATIONS ORDERED  IN ED:  Medications - No data to display   IMPRESSION / MDM / ASSESSMENT AND PLAN / ED COURSE   I have reviewed the triage note.  Differential diagnosis includes, but is not limited to, syncopal episode, vasovagal event, hypoglycemia, cardiac event  Patient's presentation is most consistent with acute presentation with potential threat to life or bodily function.  The patient is on the cardiac monitor to evaluate for evidence of arrhythmia and/or significant heart rate changes.  58 year old female presenting to the emergency department for treatment evaluation after syncopal episode.  See HPI for further details.  Triage vital signs are reassuring.  No focal neurodeficits on exam.   Plan will be to get some labs and CT of the head and cervical spine since she passed out twice.   EKG is normal.  CBC is normal.  BMP shows a random glucose 235 and a normal anion gap.   Troponin is normal at 3.  Urinalysis is without indication of infection.  No significant changes noted on bedside cardiac monitor.  CT of the head and cervical spine is normal as is the chest x-ray.  Orthostatic vital signs are normal.  Results discussed with the patient.  She feels reassured by the negative workup.  She is to follow-up with her primary care provider when possible.  She was encouraged to return to the emergency department if symptoms change or worsen if she is unable to schedule appointment.   FINAL CLINICAL IMPRESSION(S) / ED DIAGNOSES   Final diagnoses:  Syncope, unspecified syncope type     Rx / DC Orders   ED Discharge Orders     None        Note:  This document was prepared using Dragon voice recognition software and may include unintentional dictation errors.   Sherryle Don, FNP 01/27/24 1650    Ruth Cove, MD 01/30/24 1511

## 2024-01-30 ENCOUNTER — Other Ambulatory Visit: Payer: Self-pay

## 2024-01-30 ENCOUNTER — Other Ambulatory Visit (HOSPITAL_COMMUNITY): Payer: Self-pay

## 2024-03-04 ENCOUNTER — Other Ambulatory Visit: Payer: Self-pay

## 2024-04-23 ENCOUNTER — Other Ambulatory Visit: Payer: Self-pay | Admitting: Student

## 2024-04-23 ENCOUNTER — Other Ambulatory Visit (HOSPITAL_COMMUNITY): Payer: Self-pay

## 2024-04-23 ENCOUNTER — Other Ambulatory Visit: Payer: Self-pay

## 2024-04-23 DIAGNOSIS — E1165 Type 2 diabetes mellitus with hyperglycemia: Secondary | ICD-10-CM

## 2024-04-23 MED ORDER — TRULICITY 4.5 MG/0.5ML ~~LOC~~ SOAJ
4.5000 mg | SUBCUTANEOUS | 3 refills | Status: AC
  Filled 2024-04-23 – 2024-05-12 (×2): qty 2, 28d supply, fill #0
  Filled 2024-06-15: qty 2, 28d supply, fill #1
  Filled 2024-07-16: qty 2, 28d supply, fill #2
  Filled 2024-08-21: qty 2, 28d supply, fill #3

## 2024-04-23 MED ORDER — ROSUVASTATIN CALCIUM 20 MG PO TABS
20.0000 mg | ORAL_TABLET | Freq: Every day | ORAL | 3 refills | Status: AC
  Filled 2024-04-23 – 2024-05-12 (×2): qty 90, 90d supply, fill #0
  Filled 2024-08-21: qty 90, 90d supply, fill #1

## 2024-05-02 ENCOUNTER — Other Ambulatory Visit (HOSPITAL_COMMUNITY): Payer: Self-pay

## 2024-05-12 ENCOUNTER — Other Ambulatory Visit (HOSPITAL_COMMUNITY): Payer: Self-pay

## 2024-05-19 ENCOUNTER — Ambulatory Visit: Admission: EM | Admit: 2024-05-19 | Discharge: 2024-05-19 | Disposition: A | Discharge status: Home / Self Care

## 2024-05-19 DIAGNOSIS — L559 Sunburn, unspecified: Secondary | ICD-10-CM

## 2024-05-19 DIAGNOSIS — L237 Allergic contact dermatitis due to plants, except food: Secondary | ICD-10-CM | POA: Diagnosis not present

## 2024-05-19 MED ORDER — PREDNISONE 10 MG (21) PO TBPK: ORAL_TABLET | Freq: Every day | ORAL | 0 refills | Status: DC

## 2024-05-19 MED ORDER — HYDROXYZINE HCL 25 MG PO TABS: 25.0000 mg | ORAL_TABLET | Freq: Four times a day (QID) | ORAL | 0 refills | Status: DC | PRN

## 2024-05-19 NOTE — ED Provider Notes (Signed)
 [facility]    CSN: [id number] Arrival date & time: 05/19/24  1731      History   Chief Complaint No chief complaint on file.   HPI Frances Wilson is a 58 y.o. female.   Pt presents today due to marked itching associated with erythematous patches and areas of desquamation. Pt states that her symptoms have been going on for more than 4 days. Pt states that she pulled weeds in her yard and took a trip to [place]. Pt states that she has tried use of benadryl and topical steroid cream with no significant relief.      Past Medical History:  Diagnosis Date   Acute hemorrhagic colitis 04/14/2018   Fungal skin infection 05/04/2019   Severe episode of recurrent major depressive disorder, without psychotic features (HCC) 05/04/2019    Patient Active Problem List   Diagnosis Date Noted   Type 2 diabetes mellitus with hyperglycemia (HCC) 08/15/2021   Microscopic hematuria 08/15/2021   Lung nodule seen on imaging study 04/15/2018   Chronic post-traumatic stress disorder (PTSD) 11/14/2016   Hyperlipidemia 01/22/2016   Anxiety and depression 11/29/2014   Essential hypertension, benign 11/29/2014   Goiter 11/29/2014   ADHD (attention deficit hyperactivity disorder) 09/23/2014    Past Surgical History:  Procedure Laterality Date   ABDOMINAL HYSTERECTOMY     ovaries intact; no cancer.   LEG SURGERY     L hip fracture s/p ORIF.     OB History   No obstetric history on file.      Home Medications    Prior to Admission medications   Medication Sig Start Date End Date Taking? Authorizing Provider  hydrOXYzine (ATARAX) 25 MG tablet Take 1 tablet (25 mg total) by mouth every 6 (six) hours as needed for itching. 05/19/24  Yes Andra Corean BROCKS, PA-C  predniSONE (STERAPRED UNI-PAK 21 TAB) 10 MG (21) TBPK tablet Take by mouth daily. Take 6 tabs by mouth daily  for 2 days, then 5 tabs for 2 days, then 4 tabs for 2 days, then 3 tabs for 2 days, 2 tabs for 2 days, then 1  tab by mouth daily for 2 days 05/19/24  Yes Andra Corean BROCKS, PA-C  aspirin  EC 81 MG tablet Take 1 tablet (81 mg total) by mouth daily. 04/30/19   Benjamine Hamilton, DO  atomoxetine  (STRATTERA ) 10 MG capsule Take 1 capsule (10 mg total) by mouth daily for 5 days, THEN 2 capsules (20 mg total) daily. 12/31/23 02/04/24  Ezzard Staci SAILOR, NP  Continuous Glucose Sensor (FREESTYLE LIBRE 3 PLUS SENSOR) MISC Change sensor every 15 days. 10/13/23   McDiarmid, Krystal BIRCH, MD  dapagliflozin  propanediol (FARXIGA ) 10 MG TABS tablet Take 1 tablet (10 mg total) by mouth daily. 01/23/24   Cleotilde Perkins, DO  Dulaglutide  (TRULICITY ) 4.5 MG/0.5ML SOAJ Inject 4.5 mg into the skin once a week. 04/23/24   Cleotilde Perkins, DO  insulin  glargine (LANTUS  SOLOSTAR) 100 UNIT/ML Solostar Pen Inject 12 Units into the skin daily. **DISCARD PEN 28 DAYS AFTER FIRST USE** 08/27/23   McDiarmid, Krystal BIRCH, MD  Insulin  Pen Needle 31G X 5 MM MISC Use as directed to administer insulin  daily. 06/03/23   McDiarmid, Krystal BIRCH, MD  metFORMIN  (GLUCOPHAGE -XR) 500 MG 24 hr tablet Take 2 tablets (1,000 mg total) by mouth 2 (two) times daily. 12/31/23   Cleotilde Perkins, DO  Multiple Vitamin (MULTIVITAMIN ADULT PO) Take 1 tablet by mouth daily.    [provider]  rosuvastatin  (CRESTOR ) 20 MG  tablet Take 1 tablet (20 mg total) by mouth daily. 04/23/24   Cleotilde Perkins, DO  traZODone  (DESYREL ) 50 MG tablet Take 1 tablet (50 mg total) by mouth at bedtime as needed for sleep. Patient not taking: Reported on 10/13/2023 04/01/23   Cleotilde Perkins, DO    Family History Family History  Problem Relation Age of Onset   Alcohol abuse Mother    Thyroid  disease Mother    Heart disease Sister        AMI secondary to substance abuse   Breast cancer Maternal Grandmother    Pancreatic cancer Maternal Grandfather    Stomach cancer Other        GREAT AUNT   Colon cancer Neg Hx     Social History Social History   Tobacco Use   Smoking status: Former    Current packs/day:  0.00    Average packs/day: 1.5 packs/day for 18.3 years (27.5 ttl pk-yrs)    Types: Cigarettes    Start date: 08/19/1981    Quit date: 12/18/1999    Years since quitting: 24.4   Smokeless tobacco: Never  Vaping Use   Vaping status: Never Used  Substance Use Topics   Alcohol use: Yes    Alcohol/week: 0.0 standard drinks of alcohol    Comment: Occasional use    Drug use: Never     Allergies   Maxipime [cefepime]   Review of Systems Review of Systems   Physical Exam Triage Vital Signs ED Triage Vitals  Encounter Vitals Group     BP 05/19/24 1751 137/71     Girls Systolic BP Percentile --      Girls Diastolic BP Percentile --      Boys Systolic BP Percentile --      Boys Diastolic BP Percentile --      Pulse Rate 05/19/24 1751 86     Resp 05/19/24 1751 16     Temp 05/19/24 1751 98.6 F (37 C)     Temp Source 05/19/24 1751 Oral     SpO2 05/19/24 1751 98 %     Weight --      Height --      Head Circumference --      Peak Flow --      Pain Score 05/19/24 1750 0     Pain Loc --      Pain Education --      Exclude from Growth Chart --    No data found.  Updated Vital Signs BP 137/71 (BP Location: Right Arm)   Pulse 86   Temp 98.6 F (37 C) (Oral)   Resp 16   SpO2 98%   Visual Acuity Right Eye Distance:   Left Eye Distance:   Bilateral Distance:    Right Eye Near:   Left Eye Near:    Bilateral Near:     Physical Exam Vitals and nursing note reviewed.  Constitutional:      General: She is not in acute distress.    Appearance: Normal appearance. She is not ill-appearing, toxic-appearing or diaphoretic.  Eyes:     General: No scleral icterus. Cardiovascular:     Rate and Rhythm: Normal rate and regular rhythm.     Heart sounds: Normal heart sounds.  Pulmonary:     Effort: Pulmonary effort is normal. No respiratory distress.     Breath sounds: Normal breath sounds. No wheezing or rhonchi.  Skin:    General: Skin is warm.     Findings: Rash present.  Comments: Erythematous patches with desquamation noted of face, neck, back, and legs  Neurological:     Mental Status: She is alert and oriented to person, place, and time.  Psychiatric:        Mood and Affect: Mood normal.        Behavior: Behavior normal.      UC Treatments / Results  Labs (all labs ordered are listed, but only abnormal results are displayed) Labs Reviewed - No data to display  EKG   Radiology No results found.  Procedures Procedures (including critical care time)  Medications Ordered in UC Medications - No data to display  Initial Impression / Assessment and Plan / UC Course  I have reviewed the triage vital signs and the nursing notes.  Pertinent labs & imaging results that were available during my care of the patient were reviewed by me and considered in my medical decision making (see chart for details).     Contact derm due to plants- ddx for symptoms includes sunburn secondary to contact derm. Prescribing 10 mg prednisone dose pack for 10 days and Atarax 25 mg q6hrs as needed for itching. Pt advised to monitor blood sugar more frequently with using steroids.  Final Clinical Impressions(s) / UC Diagnoses   Final diagnoses:  Allergic contact dermatitis due to plants, except food  Sunburn     Discharge Instructions      You have been prescribed steroids for your illness and you have diabetes.  Steroids may raise your blood sugar, so check blood sugar more frequently.  If your blood sugar gets to 400 or above you need to report to the ER immediately.     ED Prescriptions     Medication Sig Dispense Auth. Provider   predniSONE (STERAPRED UNI-PAK 21 TAB) 10 MG (21) TBPK tablet Take by mouth daily. Take 6 tabs by mouth daily  for 2 days, then 5 tabs for 2 days, then 4 tabs for 2 days, then 3 tabs for 2 days, 2 tabs for 2 days, then 1 tab by mouth daily for 2 days 42 tablet Andra Krabbe C, PA-C   hydrOXYzine (ATARAX) 25 MG tablet Take 1  tablet (25 mg total) by mouth every 6 (six) hours as needed for itching. 12 tablet Andra Krabbe BROCKS, PA-C      PDMP not reviewed this encounter.   Andra Krabbe BROCKS, PA-C 05/19/24 1829

## 2024-05-19 NOTE — ED Triage Notes (Signed)
 Pt reports itchiness head to toes x 4 days. Eczema lotion, cocoa butter, Vaselinem,  Benadryl gives no relief.

## 2024-05-19 NOTE — Discharge Instructions (Addendum)
 You have been prescribed steroids for your illness and you have diabetes.  Steroids may raise your blood sugar, so check blood sugar more frequently.  If your blood sugar gets to 400 or above you need to report to the ER immediately.

## 2024-05-29 ENCOUNTER — Ambulatory Visit
Admission: EM | Admit: 2024-05-29 | Discharge: 2024-05-29 | Disposition: A | Discharge status: Home / Self Care | Attending: Urgent Care | Admitting: Urgent Care

## 2024-05-29 DIAGNOSIS — R300 Dysuria: Secondary | ICD-10-CM | POA: Diagnosis not present

## 2024-05-29 DIAGNOSIS — L308 Other specified dermatitis: Secondary | ICD-10-CM | POA: Diagnosis not present

## 2024-05-29 LAB — POCT URINE DIPSTICK
Bilirubin, UA: NEGATIVE
Glucose, UA: >=1000 mg/dL — AB
Leukocytes, UA: NEGATIVE
Nitrite, UA: POSITIVE — AB
POC PROTEIN,UA: NEGATIVE
Spec Grav, UA: 1.02 (ref 1.010–1.025)
Urobilinogen, UA: 0.2 U/dL
pH, UA: 5 (ref 5.0–8.0)

## 2024-05-29 MED ORDER — NITROFURANTOIN MONOHYD MACRO 100 MG PO CAPS: 100.0000 mg | ORAL_CAPSULE | Freq: Two times a day (BID) | ORAL | 0 refills | Status: DC

## 2024-05-29 MED ORDER — TRIAMCINOLONE ACETONIDE 0.1 % EX CREA: 1.0000 | TOPICAL_CREAM | Freq: Two times a day (BID) | CUTANEOUS | 1 refills | Status: AC

## 2024-05-29 MED ORDER — HYDROCORTISONE 2.5 % EX LOTN: TOPICAL_LOTION | Freq: Two times a day (BID) | CUTANEOUS | 0 refills | Status: AC

## 2024-05-29 NOTE — Discharge Instructions (Addendum)
 Urinalysis done today is consistent with a urinary tract infection.  We will treat with the following: Macrobid  100 mg twice daily for 5 days. This is an antibiotic.  Make sure to stay hydrated by drinking plenty of water. Return to urgent care or PCP if symptoms worsen or fail to resolve.   The changes on the skin of the back, trunk, legs and face are most consistent with an eczema flare.  This may have been secondary to the recent allergic dermatitis.  We will treat this with the following: Hydrocortisone cream twice daily to the face and neck itching/rash.  Apply a moisturizer first and allow the moisturizer to sit for approximately 20 minutes before applying the hydrocortisone cream.  Do not use for more than 7 to 10 days. Triamcinolone cream twice daily to the affected area for itching/rash. Apply a moisturizer first and allow the moisturizer to sit for approximately 20 minutes before applying the triamcinolone cream.  Do not apply this to the neck or face.  Do not use for more than 10 days. Return to urgent care or PCP if symptoms worsen or fail to resolve.

## 2024-05-29 NOTE — ED Triage Notes (Signed)
 Pt c/o dysuria, freq and foul smelling urine x 2 days-drinking cranberry juice/no OTC meds-also c/o scattered rash x weeks dx with poison ivy-completed prednisone-states rash is no better-NAD-steady gait

## 2024-05-29 NOTE — ED Provider Notes (Signed)
 [facility]    CSN: [id number] Arrival date & time: 05/29/24  1349      History   Chief Complaint Chief Complaint  Patient presents with   Dysuria   Rash    HPI Frances Wilson is a 58 y.o. female.   58 year old female presents urgent care with complaints of dysuria, urinary frequency and foul odor from the urine.  She first noted this about 2 days ago.  Her symptoms have gotten worse.  She has been trying to drink cranberry juice without relief.  She denies any fevers, nausea, vomiting, abdominal pain.  She also notes that she has a persistent itchy rash that was originally treated with a steroid Dosepak and Atarax but is not improving.  She reports that it is no longer blistery however it is dry itchy skin in the area.  She does have a history of eczema.  She denies any blistered areas at this time.   Dysuria Associated symptoms: no abdominal pain, no fever and no vomiting   Rash Associated symptoms: no abdominal pain, no fever, no joint pain, no shortness of breath, no sore throat and not vomiting     Past Medical History:  Diagnosis Date   Acute hemorrhagic colitis 04/14/2018   Fungal skin infection 05/04/2019   Severe episode of recurrent major depressive disorder, without psychotic features (HCC) 05/04/2019    Patient Active Problem List   Diagnosis Date Noted   Type 2 diabetes mellitus with hyperglycemia (HCC) 08/15/2021   Microscopic hematuria 08/15/2021   Lung nodule seen on imaging study 04/15/2018   Chronic post-traumatic stress disorder (PTSD) 11/14/2016   Hyperlipidemia 01/22/2016   Anxiety and depression 11/29/2014   Essential hypertension, benign 11/29/2014   Goiter 11/29/2014   ADHD (attention deficit hyperactivity disorder) 09/23/2014    Past Surgical History:  Procedure Laterality Date   ABDOMINAL HYSTERECTOMY     ovaries intact; no cancer.   LEG SURGERY     L hip fracture s/p ORIF.     OB History   No obstetric history on file.       Home Medications    Prior to Admission medications   Medication Sig Start Date End Date Taking? Authorizing Provider  hydrocortisone 2.5 % lotion Apply topically 2 (two) times daily. For no more than 7 to 10 days.  This is for the face and neck 05/29/24  Yes Daira Hine A, PA-C  nitrofurantoin , macrocrystal-monohydrate, (MACROBID ) 100 MG capsule Take 1 capsule (100 mg total) by mouth 2 (two) times daily. 05/29/24  Yes Simon Llamas A, PA-C  triamcinolone cream (KENALOG) 0.1 % Apply 1 Application topically 2 (two) times daily. For no more than 10 days.  Do not apply to the face or neck 05/29/24  Yes Teresa Almarie DELENA DEVONNA  aspirin  EC 81 MG tablet Take 1 tablet (81 mg total) by mouth daily. 04/30/19   Benjamine Hamilton, DO  atomoxetine  (STRATTERA ) 10 MG capsule Take 1 capsule (10 mg total) by mouth daily for 5 days, THEN 2 capsules (20 mg total) daily. 12/31/23 02/04/24  Ezzard Staci SAILOR, NP  Continuous Glucose Sensor (FREESTYLE LIBRE 3 PLUS SENSOR) MISC Change sensor every 15 days. 10/13/23   McDiarmid, Krystal BIRCH, MD  dapagliflozin  propanediol (FARXIGA ) 10 MG TABS tablet Take 1 tablet (10 mg total) by mouth daily. 01/23/24   Cleotilde Perkins, DO  Dulaglutide  (TRULICITY ) 4.5 MG/0.5ML SOAJ Inject 4.5 mg into the skin once a week. 04/23/24   Cleotilde Perkins, DO  hydrOXYzine (ATARAX) 25 MG  tablet Take 1 tablet (25 mg total) by mouth every 6 (six) hours as needed for itching. 05/19/24   Andra Corean BROCKS, PA-C  insulin  glargine (LANTUS  SOLOSTAR) 100 UNIT/ML Solostar Pen Inject 12 Units into the skin daily. **DISCARD PEN 28 DAYS AFTER FIRST USE** 08/27/23   McDiarmid, Krystal BIRCH, MD  Insulin  Pen Needle 31G X 5 MM MISC Use as directed to administer insulin  daily. 06/03/23   McDiarmid, Krystal BIRCH, MD  metFORMIN  (GLUCOPHAGE -XR) 500 MG 24 hr tablet Take 2 tablets (1,000 mg total) by mouth 2 (two) times daily. 12/31/23   Cleotilde Perkins, DO  Multiple Vitamin (MULTIVITAMIN ADULT PO) Take 1 tablet by mouth daily.    [provider]  predniSONE (STERAPRED UNI-PAK 21 TAB) 10 MG (21) TBPK tablet Take by mouth daily. Take as directed on back of package 05/19/24   Andra Corean BROCKS, PA-C  rosuvastatin  (CRESTOR ) 20 MG tablet Take 1 tablet (20 mg total) by mouth daily. 04/23/24   Cleotilde Perkins, DO  traZODone  (DESYREL ) 50 MG tablet Take 1 tablet (50 mg total) by mouth at bedtime as needed for sleep. Patient not taking: Reported on 10/13/2023 04/01/23   Cleotilde Perkins, DO    Family History Family History  Problem Relation Age of Onset   Alcohol abuse Mother    Thyroid  disease Mother    Heart disease Sister        AMI secondary to substance abuse   Breast cancer Maternal Grandmother    Pancreatic cancer Maternal Grandfather    Stomach cancer Other        GREAT AUNT   Colon cancer Neg Hx     Social History Social History   Tobacco Use   Smoking status: Former    Current packs/day: 0.00    Average packs/day: 1.5 packs/day for 18.3 years (27.5 ttl pk-yrs)    Types: Cigarettes    Start date: 08/19/1981    Quit date: 12/18/1999    Years since quitting: 24.4   Smokeless tobacco: Never  Vaping Use   Vaping status: Never Used  Substance Use Topics   Alcohol use: Yes    Comment: occ   Drug use: Never     Allergies   Maxipime [cefepime]   Review of Systems Review of Systems  Constitutional:  Negative for chills and fever.  HENT:  Negative for ear pain and sore throat.   Eyes:  Negative for pain and visual disturbance.  Respiratory:  Negative for cough and shortness of breath.   Cardiovascular:  Negative for chest pain and palpitations.  Gastrointestinal:  Negative for abdominal pain and vomiting.  Genitourinary:  Positive for dysuria and frequency. Negative for hematuria.       Foul-smelling urine  Musculoskeletal:  Negative for arthralgias and back pain.  Skin:  Positive for rash. Negative for color change.  Neurological:  Negative for seizures and syncope.  All other systems reviewed and are  negative.    Physical Exam Triage Vital Signs ED Triage Vitals  Encounter Vitals Group     BP 05/29/24 1407 118/77     Girls Systolic BP Percentile --      Girls Diastolic BP Percentile --      Boys Systolic BP Percentile --      Boys Diastolic BP Percentile --      Pulse Rate 05/29/24 1407 100     Resp 05/29/24 1407 18     Temp 05/29/24 1407 98.8 F (37.1 C)     Temp Source 05/29/24  1407 Oral     SpO2 05/29/24 1407 97 %     Weight --      Height --      Head Circumference --      Peak Flow --      Pain Score 05/29/24 1414 2     Pain Loc --      Pain Education --      Exclude from Growth Chart --    No data found.  Updated Vital Signs BP 118/77 (BP Location: Left Arm)   Pulse 100   Temp 98.8 F (37.1 C) (Oral)   Resp 18   SpO2 97%   Visual Acuity Right Eye Distance:   Left Eye Distance:   Bilateral Distance:    Right Eye Near:   Left Eye Near:    Bilateral Near:     Physical Exam Vitals and nursing note reviewed.  Constitutional:      General: She is not in acute distress.    Appearance: She is well-developed.  HENT:     Head: Normocephalic and atraumatic.  Eyes:     Conjunctiva/sclera: Conjunctivae normal.  Cardiovascular:     Rate and Rhythm: Normal rate and regular rhythm.     Heart sounds: No murmur heard. Pulmonary:     Effort: Pulmonary effort is normal. No respiratory distress.     Breath sounds: Normal breath sounds.  Abdominal:     Palpations: Abdomen is soft.     Tenderness: There is no abdominal tenderness.  Musculoskeletal:        General: No swelling.     Cervical back: Neck supple.  Skin:    General: Skin is warm and dry.     Capillary Refill: Capillary refill takes less than 2 seconds.     Findings: Rash present. Rash is macular.     Comments: Scaly and flaky macular rash on the trunk, legs, face and back  Neurological:     Mental Status: She is alert.  Psychiatric:        Mood and Affect: Mood normal.      UC Treatments  / Results  Labs (all labs ordered are listed, but only abnormal results are displayed) Labs Reviewed  POCT URINE DIPSTICK - Abnormal; Notable for the following components:      Result Value   Clarity, UA cloudy (*)    Glucose, UA >=1,000 (*)    Ketones, POC UA small (15) (*)    Blood, UA moderate (*)    Nitrite, UA Positive (*)    All other components within normal limits    EKG   Radiology No results found.  Procedures Procedures (including critical care time)  Medications Ordered in UC Medications - No data to display  Initial Impression / Assessment and Plan / UC Course  I have reviewed the triage vital signs and the nursing notes.  Pertinent labs & imaging results that were available during my care of the patient were reviewed by me and considered in my medical decision making (see chart for details).     Dysuria  Other eczema   Urinalysis done today is consistent with a urinary tract infection.  We will treat with the following: Macrobid  100 mg twice daily for 5 days. This is an antibiotic.  Make sure to stay hydrated by drinking plenty of water. Return to urgent care or PCP if symptoms worsen or fail to resolve.   The changes on the skin of the back, trunk, legs and face  are most consistent with an eczema flare.  This may have been secondary to the recent allergic dermatitis.  We will treat this with the following: Hydrocortisone cream twice daily to the face and neck itching/rash.  Apply a moisturizer first and allow the moisturizer to sit for approximately 20 minutes before applying the hydrocortisone cream.  Do not use for more than 7 to 10 days. Triamcinolone cream twice daily to the affected area for itching/rash. Apply a moisturizer first and allow the moisturizer to sit for approximately 20 minutes before applying the triamcinolone cream.  Do not apply this to the neck or face.  Do not use for more than 10 days. Return to urgent care or PCP if symptoms worsen  or fail to resolve.    Final Clinical Impressions(s) / UC Diagnoses   Final diagnoses:  Dysuria  Other eczema     Discharge Instructions      Urinalysis done today is consistent with a urinary tract infection.  We will treat with the following: Macrobid  100 mg twice daily for 5 days. This is an antibiotic.  Make sure to stay hydrated by drinking plenty of water. Return to urgent care or PCP if symptoms worsen or fail to resolve.   The changes on the skin of the back, trunk, legs and face are most consistent with an eczema flare.  This may have been secondary to the recent allergic dermatitis.  We will treat this with the following: Hydrocortisone cream twice daily to the face and neck itching/rash.  Apply a moisturizer first and allow the moisturizer to sit for approximately 20 minutes before applying the hydrocortisone cream.  Do not use for more than 7 to 10 days. Triamcinolone cream twice daily to the affected area for itching/rash. Apply a moisturizer first and allow the moisturizer to sit for approximately 20 minutes before applying the triamcinolone cream.  Do not apply this to the neck or face.  Do not use for more than 10 days. Return to urgent care or PCP if symptoms worsen or fail to resolve.       ED Prescriptions     Medication Sig Dispense Auth. Provider   nitrofurantoin , macrocrystal-monohydrate, (MACROBID ) 100 MG capsule Take 1 capsule (100 mg total) by mouth 2 (two) times daily. 10 capsule Teresa Norris A, PA-C   hydrocortisone 2.5 % lotion Apply topically 2 (two) times daily. For no more than 7 to 10 days.  This is for the face and neck 59 mL Blessing Ozga A, PA-C   triamcinolone cream (KENALOG) 0.1 % Apply 1 Application topically 2 (two) times daily. For no more than 10 days.  Do not apply to the face or neck 80 g Teresa Norris LABOR, PA-C      PDMP not reviewed this encounter.   Teresa Norris LABOR, PA-C 05/29/24 1432

## 2024-06-21 ENCOUNTER — Other Ambulatory Visit: Payer: Self-pay

## 2024-07-13 ENCOUNTER — Other Ambulatory Visit (HOSPITAL_COMMUNITY): Payer: Self-pay

## 2024-07-13 ENCOUNTER — Encounter: Payer: Self-pay | Admitting: Student

## 2024-07-13 ENCOUNTER — Ambulatory Visit: Admitting: Student

## 2024-07-13 VITALS — BP 148/83 | HR 88 | Ht 67.0 in | Wt 169.0 lb

## 2024-07-13 DIAGNOSIS — F331 Major depressive disorder, recurrent, moderate: Secondary | ICD-10-CM

## 2024-07-13 DIAGNOSIS — F900 Attention-deficit hyperactivity disorder, predominantly inattentive type: Secondary | ICD-10-CM | POA: Diagnosis not present

## 2024-07-13 DIAGNOSIS — R911 Solitary pulmonary nodule: Secondary | ICD-10-CM | POA: Diagnosis not present

## 2024-07-13 DIAGNOSIS — E1165 Type 2 diabetes mellitus with hyperglycemia: Secondary | ICD-10-CM

## 2024-07-13 LAB — POCT GLYCOSYLATED HEMOGLOBIN (HGB A1C): HbA1c, POC (controlled diabetic range): 9.6 % — AB (ref 0.0–7.0)

## 2024-07-13 MED ORDER — ARIPIPRAZOLE 2 MG PO TABS
2.0000 mg | ORAL_TABLET | Freq: Every day | ORAL | 0 refills | Status: DC
  Filled 2024-07-13: qty 30, 30d supply, fill #0

## 2024-07-13 NOTE — Progress Notes (Signed)
    SUBJECTIVE:   CHIEF COMPLAINT / HPI:   Frances Wilson is a 58 y.o. female presenting for ADHD and mood disorder follow up.   - Overwhelming stress and difficulty concentrating impacting job performance and daily activities including reading, cleaning, and crocheting - Thoughts feel jumbled, leading to fear and frustration - Symptoms have led to consideration of quitting her job - ADHD diagnosed in her twenties - Increased anxiety present - Anti-anxiety medication taken sporadically; half a pill provides temporary relief but does not improve task completion - Consistent use of anti-anxiety medication last month did not significantly improve symptoms - Previously prescribed Strattera  for ADHD, but insurance denied coverage  Diabetes mellitus management - Diabetes managed with insulin , Farxiga , and Trulicity  - Administers 12 units of insulin  - Does not take the full dose of prescribed metformin   PERTINENT  PMH / PSH: reviewed and updated.  OBJECTIVE:   BP (!) 148/83   Pulse 88   Ht 5' 7 (1.702 m)   Wt 169 lb (76.7 kg)   SpO2 100%   BMI 26.47 kg/m   Well-appearing, no acute distress Cardio: Regular rate, regular rhythm, no murmurs on exam. Pulm: Clear, no wheezing, no crackles. No increased work of breathing Abdominal: bowel sounds present, soft, non-tender, non-distended Extremities: no peripheral edema   ASSESSMENT/PLAN:   Assessment & Plan Moderate episode of recurrent major depressive disorder (HCC) Patient has experienced episodes of depression and heightened times of anxiety. We have tried SSRI and SNRI without significant benefit, although she has struggled with staying consistent with taking her medication.  Plan to trial Abilify  2 mg daily to help with mood regulation.  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 Will need to consider treatment with non-stimulants as her anxiety and depression is currently uncontrolled which I suspect is contributing to her overall distress  Type 2 diabetes mellitus with hyperglycemia, without long-term current use of insulin  (HCC) A1c today 9.6 Patient has not taken her metformin  consistently - plan to restart at 1,000 mg BID Reports good compliance to Farxiga  10 mg, Trulicity  4.5 mg/weekly, Lantus  12 units daily  Lung nodule seen on imaging study Will need to discuss repeat imaging at the next appointment      Damien Pinal, DO [facility]

## 2024-07-13 NOTE — Patient Instructions (Signed)
 It was great to see you today!   I am starting Abilify  2 mg daily. Follow up with me in 4 weeks. If you are doing well we can talk about medication for ADHD.   I am checking your A1c today   No future appointments.  Please arrive 15 minutes before your appointment to ensure smooth check in process.  If you are more than 15 minutes late, you may be asked to reschedule.   Please bring a list of your medications with you to all appointments.   Please call the clinic at [phone] if your symptoms worsen or you have any concerns.  Thank you for allowing me to participate in your care, Dr. Damien Pinal [facility]

## 2024-07-14 ENCOUNTER — Ambulatory Visit: Payer: Self-pay | Admitting: Student

## 2024-07-14 ENCOUNTER — Other Ambulatory Visit (HOSPITAL_COMMUNITY): Payer: Self-pay

## 2024-07-14 DIAGNOSIS — E119 Type 2 diabetes mellitus without complications: Secondary | ICD-10-CM

## 2024-07-14 MED ORDER — METFORMIN HCL ER 500 MG PO TB24
1000.0000 mg | ORAL_TABLET | Freq: Two times a day (BID) | ORAL | 3 refills | Status: AC
  Filled 2024-07-14: qty 120, 30d supply, fill #0
  Filled 2024-08-21: qty 120, 30d supply, fill #1

## 2024-07-14 NOTE — Telephone Encounter (Signed)
 Called to discuss elevated A1c of 9.6 elevated from 7.9. Patient reports she has not been taking her PM dose of metformin .   Discussed all options including increasing her insulin  verses restarting PM metformin . She elected to restart PM metformin . Will follow up in 3 months for A1c and 4 weeks for mood check.   Damien Pinal, DO [facility], PGY-3 07/14/24 3:40 PM

## 2024-07-14 NOTE — Assessment & Plan Note (Signed)
 A1c today 9.6 Patient has not taken her metformin  consistently - plan to restart at 1,000 mg BID Reports good compliance to Farxiga  10 mg, Trulicity  4.5 mg/weekly, Lantus  12 units daily

## 2024-07-14 NOTE — Assessment & Plan Note (Addendum)
 Will need to discuss repeat imaging at the next appointment

## 2024-07-14 NOTE — Assessment & Plan Note (Addendum)
 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

## 2024-07-16 ENCOUNTER — Other Ambulatory Visit: Payer: Self-pay | Admitting: Student

## 2024-07-16 ENCOUNTER — Other Ambulatory Visit: Payer: Self-pay

## 2024-07-16 DIAGNOSIS — E1165 Type 2 diabetes mellitus with hyperglycemia: Secondary | ICD-10-CM

## 2024-07-17 ENCOUNTER — Other Ambulatory Visit (HOSPITAL_COMMUNITY): Payer: Self-pay

## 2024-07-20 ENCOUNTER — Other Ambulatory Visit: Payer: Self-pay

## 2024-07-20 ENCOUNTER — Other Ambulatory Visit (HOSPITAL_COMMUNITY): Payer: Self-pay

## 2024-07-20 ENCOUNTER — Encounter: Payer: Self-pay | Admitting: Pharmacist

## 2024-07-20 ENCOUNTER — Telehealth: Payer: Self-pay

## 2024-07-20 ENCOUNTER — Ambulatory Visit: Admitting: Pharmacist

## 2024-07-20 VITALS — Wt 166.6 lb

## 2024-07-20 DIAGNOSIS — E1165 Type 2 diabetes mellitus with hyperglycemia: Secondary | ICD-10-CM

## 2024-07-20 DIAGNOSIS — Z794 Long term (current) use of insulin: Secondary | ICD-10-CM

## 2024-07-20 MED ORDER — DAPAGLIFLOZIN PROPANEDIOL 10 MG PO TABS
10.0000 mg | ORAL_TABLET | Freq: Every day | ORAL | 3 refills | Status: AC
  Filled 2024-07-20 – 2024-08-04 (×3): qty 30, 30d supply, fill #0
  Filled 2024-08-21 – 2024-09-09 (×2): qty 30, 30d supply, fill #1

## 2024-07-20 NOTE — Patient Instructions (Signed)
 It was nice to see you today!  Your goal blood sugar is 80-130 before eating and less than 180 after eating.  Medication Changes: Continue all other medication the same.    What If My Sensor Falls Off or What If My Sensor Isn't Working? Call [employer] Customer Care Team at [phone] Available 7 days a week from 8AM-8PM EST, excluding holidays

## 2024-07-20 NOTE — Telephone Encounter (Signed)
 Patient returns call to nurse line. She reports that her blood sugar on glucometer was 150. Sensor is currently reading at 70. She has had this sensor on for the last week.   She reports that she is compliant on metformin , farxiga , Trulicity  and Lantus  12 units daily.   We scheduled her for follow up in pharmacy clinic this afternoon to further address/evaluate this concern.   Precautions discussed.   Chiquita JAYSON English, RN

## 2024-07-20 NOTE — Telephone Encounter (Signed)
 Patient calls nurse line regarding lower readings on her CGM sensor. She reports that for the last 24 hours, she has been having reading of less than 60. Only current symptom is fatigue.   Asked patient to check with finger stick and glucometer to ensure that sensor is giving accurate reading. Patient reports that she has five minutes until her class gets out and that she will check at that time. She will call back when she is able to check blood sugar with machine and discuss further.   Chiquita JAYSON English, RN

## 2024-07-20 NOTE — Progress Notes (Signed)
 S:     Chief Complaint  Patient presents with   Medication Management    Low glucose readings - CGM issue   58 y.o. female who presents for diabetes evaluation, education, and management.  Today, patient arrives in good spirits and presents without any assistance.  Majority of visit was focused on low glucose readings due to defective CGM sensor and replacement.    PMH is significant for diabetes, HLD.  Patient was last seen by Primary Care Provider, Dr. CHARLENA Pinal, on 07/13/2024.    At last visit, increased dose of basal insulin  Lantus  (insulin  glargine) from 10 to 12 units once daily.    Patient reports Diabetes was diagnosed in 14 years ago.    Current diabetes medications include: dapagliflozin  (Farxiga ) 10 mg once daily, dulaglutide  (Trulicity ) 4.5 mg once weekly, metformin  500 mg 2 tablets twice daily, insulin  Lantus  (glargine) 12 units once daily   Patient reports adherence to taking all medications as prescribed.    Do you feel that your medications are working for you? yes Have you been experiencing any side effects to the medications prescribed? no Do you have any problems obtaining medications due to transportation or finances? no Insurance coverage: [employer]   Patient reports hypoglycemic events on her CGM but does not feel symptoms of being low.  Shared that there has been a recall of sensors due to low readings.  Replaced CGM Libre 3+ sensor while in office.   O:   Review of Systems  Constitutional:  Positive for malaise/fatigue.  All other systems reviewed and are negative.   Physical Exam Constitutional:      Appearance: Normal appearance.  Pulmonary:     Effort: Pulmonary effort is normal.  Neurological:     Mental Status: She is alert.  Psychiatric:        Mood and Affect: Mood normal.        Thought Content: Thought content normal.        Judgment: Judgment normal.     Libre3 CGM Download today was not reviewed in great detail OR documented here  due to fear of erroneous values with defective sensor recall possibility given recent update from manufacturer.  Cannot verify serial number of current sensor in use.   Lab Results  Component Value Date   HGBA1C 9.6 (A) 07/13/2024    Lipid Panel     Component Value Date/Time   CHOL 135 11/18/2023 0922   TRIG 88 11/18/2023 0922   HDL 46 11/18/2023 0922   CHOLHDL 2.9 11/18/2023 0922   CHOLHDL 6.8 (H) 10/09/2016 1529   VLDL 38 (H) 10/09/2016 1529   LDLCALC 72 11/18/2023 0922     A/P: Diabetes longstanding recently worsened control with A1c of 9.6%  Patient is able to verbalize appropriate hypoglycemia management plan. Medication adherence appears good.  -Continued basal insulin  Lantus  (insulin  glargine)12 units daily -Continued GLP-1 Trulicity  (generic dulaglutide ) 4.5 mg weekly -Continued SGLT2-I Farxiga  (generic dapagliflozin ) 10 mg daily. Counseled on sick day rules. -Continued metformin  500 mg to 2 tablets BID -Patient educated on purpose, proper use, and potential adverse effects.  -Extensively discussed pathophysiology of diabetes, recommended lifestyle interventions, dietary effects on blood sugar control.  -Counseled on s/sx of and management of hypoglycemia.  - Provided 2 new sensors for use during period of determination if sensors were reasons for low readings.  New libre 3+ sample sensor applied and connected to phone.     Written patient instructions provided. Patient verbalized understanding of treatment  plan.  Total time in face to face counseling 25 minutes.    Follow-up:  Pharmacist PRN

## 2024-07-20 NOTE — Assessment & Plan Note (Signed)
 Diabetes longstanding recently worsened control with A1c of 9.6%  Patient is able to verbalize appropriate hypoglycemia management plan. Medication adherence appears good.  -Continued basal insulin  Lantus  (insulin  glargine)12 units daily -Continued GLP-1 Trulicity  (generic dulaglutide ) 4.5 mg weekly -Continued SGLT2-I Farxiga  (generic dapagliflozin ) 10 mg daily. Counseled on sick day rules. -Continued metformin  500 mg to 2 tablets BID -Patient educated on purpose, proper use, and potential adverse effects.  -Extensively discussed pathophysiology of diabetes, recommended lifestyle interventions, dietary effects on blood sugar control.  -Counseled on s/sx of and management of hypoglycemia.  - Provided 2 new sensors for use during period of determination if sensors were reasons for low readings.  New libre 3+ sample sensor applied and connected to phone.

## 2024-07-21 NOTE — Progress Notes (Signed)
 Reviewed and agree with Dr Rennis plan.

## 2024-08-02 ENCOUNTER — Other Ambulatory Visit (HOSPITAL_COMMUNITY): Payer: Self-pay

## 2024-08-02 ENCOUNTER — Ambulatory Visit: Admitting: Student

## 2024-08-02 ENCOUNTER — Encounter: Payer: Self-pay | Admitting: Student

## 2024-08-02 ENCOUNTER — Telehealth (HOSPITAL_COMMUNITY): Payer: Self-pay

## 2024-08-02 VITALS — BP 144/88 | HR 86 | Ht 67.0 in | Wt 166.6 lb

## 2024-08-02 DIAGNOSIS — E1165 Type 2 diabetes mellitus with hyperglycemia: Secondary | ICD-10-CM

## 2024-08-02 DIAGNOSIS — F900 Attention-deficit hyperactivity disorder, predominantly inattentive type: Secondary | ICD-10-CM

## 2024-08-02 DIAGNOSIS — Z1231 Encounter for screening mammogram for malignant neoplasm of breast: Secondary | ICD-10-CM

## 2024-08-02 MED ORDER — ATOMOXETINE HCL 10 MG PO CAPS
ORAL_CAPSULE | ORAL | 0 refills | Status: DC
  Filled 2024-08-02 – 2024-08-03 (×2): qty 55, 30d supply, fill #0

## 2024-08-02 NOTE — Patient Instructions (Addendum)
 It was great to see you today!   I am ordering a new medication for the ADHD. I will let you know once we get insurance approval   I have ordered a mammogram. This is done through the [facility]. They will call you for scheduling.  [facility] [address] [place],  [place]  [address] Main: [phone]  Future Appointments  Date Time Provider Department Center  08/02/2024  1:30 PM Cleotilde Perkins, DO [facility]    Please arrive 15 minutes before your appointment to ensure smooth check in process.  If you are more than 15 minutes late, you may be asked to reschedule.   Please bring a list of your medications with you to all appointments.   Please call the clinic at [phone] if your symptoms worsen or you have any concerns.  Thank you for allowing me to participate in your care, Dr. Perkins Cleotilde [facility]

## 2024-08-02 NOTE — Assessment & Plan Note (Addendum)
 Discussed diabetic eye exam today

## 2024-08-02 NOTE — Progress Notes (Signed)
° ° °  SUBJECTIVE:   CHIEF COMPLAINT / HPI:   Frances Wilson is a 58 y.o. female presenting for mood follow up.   At prior visit on 11/25:  - prescribed Abilify  2 mg to help with mood dysregulation  - follow up needed to determine role of treatment for ADHD   Abilify  did not help with symptoms. She reports continued issues with focus at work to the point it is impacting her mental health where she has had thoughts of hurting herself. She reports these are just thoughts and she would next act on this. Denies active SI with a plan.   T2DM:  Last A1c 9.6 - Evaluated by Dr. Koval on 07/20/24  - cont on Lantus  12 units daily, Trulicity  4.5 mg weekly, Farxiga  10 mg daily, Metformin  1,000 mg BID   Health Maintenance:  Due for mammogram, eye exam   PERTINENT  PMH / PSH: reviewed and updated.  OBJECTIVE:   BP (!) 144/88   Pulse 86   Ht 5' 7 (1.702 m)   Wt 166 lb 9.6 oz (75.6 kg)   SpO2 100%   BMI 26.09 kg/m   Well-appearing, no acute distress Cardio: Regular rate, regular rhythm, no murmurs on exam. Pulm: Clear, no wheezing, no crackles. No increased work of breathing Abdominal: bowel sounds present, soft, non-tender, non-distended Extremities: no peripheral edema  Psych:  Cognition and judgment appear intact. Alert, communicative  and cooperative with normal attention span and concentration. No apparent delusions, illusions, hallucinations    ASSESSMENT/PLAN:   Assessment & Plan Attention deficit hyperactivity disorder (ADHD), predominantly inattentive type Patient has failed at attempts to control her ADHD with SSRI, SNRIs and Abilify .  Due to this impacting her work and contributing to her poor mental health, will prescribe Strattera  10 mg daily.  If this is not able to be approved by insurance, will prescribe Adderrall XR  Encounter for screening mammogram for malignant neoplasm of breast Mammogram ordered  Type 2 diabetes mellitus with hyperglycemia, with long-term  current use of insulin  (HCC) Discussed diabetic eye exam today      Damien Pinal, DO [facility]

## 2024-08-02 NOTE — Telephone Encounter (Signed)
 Pharmacy Patient Advocate Encounter   Received notification from Pt Calls Messages that prior authorization for Atomoxetine  HCl 10MG  capsules  is required/requested.   Insurance verification completed.   The patient is insured through [facility].   Per test claim: PA required; PA submitted to above mentioned insurance via Latent Key/confirmation #/[id number] Status is pending

## 2024-08-02 NOTE — Assessment & Plan Note (Addendum)
 Patient has failed at attempts to control her ADHD with SSRI, SNRIs and Abilify .  Due to this impacting her work and contributing to her poor mental health, will prescribe Strattera  10 mg daily.  If this is not able to be approved by insurance, will prescribe Adderrall XR

## 2024-08-02 NOTE — Telephone Encounter (Signed)
 PA request has been Received. New Encounter has been or will be created for follow up. For additional info see Pharmacy Prior Auth telephone encounter from 08/02/24.

## 2024-08-03 ENCOUNTER — Other Ambulatory Visit (HOSPITAL_COMMUNITY): Payer: Self-pay

## 2024-08-03 NOTE — Telephone Encounter (Signed)
 Pharmacy Patient Advocate Encounter  Received notification from [facility] that Prior Authorization for  Atomoxetine  HCl 10MG  capsules  has been APPROVED from 08/02/24 to 08/03/27. Ran test claim, Copay is $5. This test claim was processed through [facility]- copay amounts may vary at other pharmacies due to pharmacy/plan contracts, or as the patient moves through the different stages of their insurance plan.   PA #/Case ID/Reference #: [id number]

## 2024-08-04 ENCOUNTER — Other Ambulatory Visit (HOSPITAL_COMMUNITY): Payer: Self-pay

## 2024-08-05 ENCOUNTER — Other Ambulatory Visit: Payer: Self-pay

## 2024-08-10 ENCOUNTER — Other Ambulatory Visit (HOSPITAL_COMMUNITY): Payer: Self-pay

## 2024-08-10 ENCOUNTER — Telehealth: Payer: Self-pay

## 2024-08-10 ENCOUNTER — Other Ambulatory Visit: Payer: Self-pay | Admitting: Family Medicine

## 2024-08-10 DIAGNOSIS — N3090 Cystitis, unspecified without hematuria: Secondary | ICD-10-CM

## 2024-08-10 MED ORDER — NITROFURANTOIN MONOHYD MACRO 100 MG PO CAPS
100.0000 mg | ORAL_CAPSULE | Freq: Two times a day (BID) | ORAL | 0 refills | Status: AC
  Filled 2024-08-10: qty 10, 5d supply, fill #0

## 2024-08-10 NOTE — Telephone Encounter (Signed)
 Called patient.   Patient was appreciative of medication to her pharmacy.

## 2024-08-10 NOTE — Telephone Encounter (Signed)
 Patient calls nurse line reporting continued UTI symptoms.   She reports she discussed with PCP at 12/15 office visit. She reports PCP stated she would send in an antibiotic. However, she reports her pharmacy does not have prescription.   Patient reports dysuria and an abnormal odor.   She denies any back pain, abdominal pain, hematuria, fevers or chills.   She reports she was given Macrobid  by UC in October. She reports she would like this again.  Apt offered for this afternoon. However patient stated she is going out of town at 12:30pm.  Advised to push fluids and precautions discussed.   Will forward to PCP.   She asks you send prescription to [facility] on [address]. She reports she will have this transferred to a [facility] near her.

## 2024-08-20 ENCOUNTER — Ambulatory Visit: Admission: RE | Admit: 2024-08-20 | Source: Ambulatory Visit

## 2024-08-20 DIAGNOSIS — Z1231 Encounter for screening mammogram for malignant neoplasm of breast: Secondary | ICD-10-CM

## 2024-08-21 ENCOUNTER — Other Ambulatory Visit: Payer: Self-pay | Admitting: Student

## 2024-08-21 ENCOUNTER — Other Ambulatory Visit (HOSPITAL_COMMUNITY): Payer: Self-pay

## 2024-08-21 DIAGNOSIS — F331 Major depressive disorder, recurrent, moderate: Secondary | ICD-10-CM

## 2024-08-23 ENCOUNTER — Other Ambulatory Visit: Payer: Self-pay

## 2024-08-24 ENCOUNTER — Other Ambulatory Visit (HOSPITAL_COMMUNITY): Payer: Self-pay

## 2024-08-24 ENCOUNTER — Other Ambulatory Visit: Payer: Self-pay

## 2024-08-24 MED ORDER — ARIPIPRAZOLE 2 MG PO TABS
2.0000 mg | ORAL_TABLET | Freq: Every day | ORAL | 0 refills | Status: AC
  Filled 2024-08-24: qty 30, 30d supply, fill #0

## 2024-08-25 ENCOUNTER — Other Ambulatory Visit (HOSPITAL_COMMUNITY): Payer: Self-pay

## 2024-08-25 ENCOUNTER — Other Ambulatory Visit: Payer: Self-pay

## 2024-08-26 ENCOUNTER — Other Ambulatory Visit: Payer: Self-pay | Admitting: Student

## 2024-08-26 DIAGNOSIS — N6320 Unspecified lump in the left breast, unspecified quadrant: Secondary | ICD-10-CM

## 2024-09-03 ENCOUNTER — Other Ambulatory Visit: Payer: Self-pay | Admitting: Family Medicine

## 2024-09-03 DIAGNOSIS — N6320 Unspecified lump in the left breast, unspecified quadrant: Secondary | ICD-10-CM

## 2024-09-07 ENCOUNTER — Ambulatory Visit
Admission: RE | Admit: 2024-09-07 | Discharge: 2024-09-07 | Disposition: A | Source: Ambulatory Visit | Attending: Family Medicine | Admitting: Family Medicine

## 2024-09-07 ENCOUNTER — Other Ambulatory Visit (HOSPITAL_COMMUNITY): Payer: Self-pay

## 2024-09-07 DIAGNOSIS — N6320 Unspecified lump in the left breast, unspecified quadrant: Secondary | ICD-10-CM

## 2024-09-09 ENCOUNTER — Other Ambulatory Visit: Payer: Self-pay | Admitting: Student

## 2024-09-09 ENCOUNTER — Other Ambulatory Visit: Payer: Self-pay

## 2024-09-10 ENCOUNTER — Other Ambulatory Visit: Payer: Self-pay

## 2024-09-10 ENCOUNTER — Other Ambulatory Visit (HOSPITAL_COMMUNITY): Payer: Self-pay

## 2024-09-10 MED ORDER — ATOMOXETINE HCL 10 MG PO CAPS
ORAL_CAPSULE | ORAL | 0 refills | Status: AC
  Filled 2024-09-10: qty 55, 30d supply, fill #0
